# Patient Record
Sex: Female | Born: 1940 | Race: White | Hispanic: No | Smoking: Former smoker
Health system: Southern US, Community
[De-identification: ages and names within clinical notes are randomized; demographics above are authoritative.]

## PROBLEM LIST (undated history)

## (undated) DIAGNOSIS — R5382 Chronic fatigue, unspecified: Secondary | ICD-10-CM

## (undated) DIAGNOSIS — E669 Obesity, unspecified: Secondary | ICD-10-CM

## (undated) DIAGNOSIS — E039 Hypothyroidism, unspecified: Secondary | ICD-10-CM

## (undated) DIAGNOSIS — G459 Transient cerebral ischemic attack, unspecified: Secondary | ICD-10-CM

## (undated) DIAGNOSIS — R0609 Other forms of dyspnea: Secondary | ICD-10-CM

## (undated) DIAGNOSIS — R0600 Dyspnea, unspecified: Secondary | ICD-10-CM

## (undated) DIAGNOSIS — J302 Other seasonal allergic rhinitis: Secondary | ICD-10-CM

## (undated) DIAGNOSIS — I482 Chronic atrial fibrillation, unspecified: Principal | ICD-10-CM

## (undated) DIAGNOSIS — G8929 Other chronic pain: Secondary | ICD-10-CM

## (undated) DIAGNOSIS — J449 Chronic obstructive pulmonary disease, unspecified: Secondary | ICD-10-CM

## (undated) DIAGNOSIS — E079 Disorder of thyroid, unspecified: Secondary | ICD-10-CM

## (undated) DIAGNOSIS — I639 Cerebral infarction, unspecified: Secondary | ICD-10-CM

## (undated) HISTORY — DX: Transient cerebral ischemic attack, unspecified: G45.9

## (undated) HISTORY — PX: TOTAL HIP ARTHROPLASTY: SHX124

## (undated) HISTORY — DX: Chronic fatigue, unspecified: R53.82

## (undated) HISTORY — DX: Chronic obstructive pulmonary disease, unspecified: J44.9

## (undated) HISTORY — DX: Cerebral infarction, unspecified: I63.9

## (undated) HISTORY — DX: Hypothyroidism, unspecified: E03.9

## (undated) HISTORY — DX: Other chronic pain: G89.29

## (undated) HISTORY — DX: Other forms of dyspnea: R06.09

## (undated) HISTORY — PX: TONSILLECTOMY: SUR1361

## (undated) HISTORY — DX: Other seasonal allergic rhinitis: J30.2

## (undated) HISTORY — DX: Chronic atrial fibrillation, unspecified: I48.20

## (undated) HISTORY — DX: Obesity, unspecified: E66.9

## (undated) HISTORY — DX: Dyspnea, unspecified: R06.00

## (undated) HISTORY — DX: Disorder of thyroid, unspecified: E07.9

---

## 2003-05-01 ENCOUNTER — Ambulatory Visit (HOSPITAL_COMMUNITY): Admission: RE | Admit: 2003-05-01 | Discharge: 2003-05-01 | Payer: Self-pay | Admitting: Cardiology

## 2004-03-13 ENCOUNTER — Ambulatory Visit (HOSPITAL_COMMUNITY): Admission: RE | Admit: 2004-03-13 | Discharge: 2004-03-13 | Payer: Self-pay | Admitting: Internal Medicine

## 2004-07-10 ENCOUNTER — Ambulatory Visit: Payer: Self-pay | Admitting: Internal Medicine

## 2004-09-08 ENCOUNTER — Ambulatory Visit: Payer: Self-pay | Admitting: Internal Medicine

## 2004-09-29 ENCOUNTER — Encounter (HOSPITAL_COMMUNITY): Admission: RE | Admit: 2004-09-29 | Discharge: 2004-12-28 | Payer: Self-pay | Admitting: Endocrinology

## 2005-06-17 ENCOUNTER — Encounter (HOSPITAL_COMMUNITY): Admission: RE | Admit: 2005-06-17 | Discharge: 2005-09-15 | Payer: Self-pay | Admitting: Endocrinology

## 2005-12-16 ENCOUNTER — Encounter: Admission: RE | Admit: 2005-12-16 | Discharge: 2005-12-16 | Payer: Self-pay | Admitting: Orthopaedic Surgery

## 2006-01-07 ENCOUNTER — Encounter: Admission: RE | Admit: 2006-01-07 | Discharge: 2006-01-07 | Payer: Self-pay | Admitting: Orthopaedic Surgery

## 2006-02-22 ENCOUNTER — Encounter: Payer: Self-pay | Admitting: Internal Medicine

## 2006-03-23 ENCOUNTER — Encounter: Admission: RE | Admit: 2006-03-23 | Discharge: 2006-03-23 | Payer: Self-pay | Admitting: Orthopaedic Surgery

## 2006-05-09 ENCOUNTER — Encounter: Admission: RE | Admit: 2006-05-09 | Discharge: 2006-05-09 | Payer: Self-pay | Admitting: Cardiology

## 2006-06-03 ENCOUNTER — Ambulatory Visit (HOSPITAL_COMMUNITY): Admission: RE | Admit: 2006-06-03 | Discharge: 2006-06-03 | Payer: Self-pay | Admitting: Cardiology

## 2006-06-15 ENCOUNTER — Encounter: Admission: RE | Admit: 2006-06-15 | Discharge: 2006-06-15 | Payer: Self-pay | Admitting: Orthopaedic Surgery

## 2006-08-24 ENCOUNTER — Emergency Department (HOSPITAL_COMMUNITY): Admission: EM | Admit: 2006-08-24 | Discharge: 2006-08-24 | Payer: Self-pay | Admitting: Emergency Medicine

## 2006-08-31 ENCOUNTER — Ambulatory Visit (HOSPITAL_COMMUNITY): Admission: RE | Admit: 2006-08-31 | Discharge: 2006-08-31 | Payer: Self-pay | Admitting: Cardiology

## 2006-12-07 ENCOUNTER — Inpatient Hospital Stay (HOSPITAL_COMMUNITY): Admission: RE | Admit: 2006-12-07 | Discharge: 2006-12-14 | Payer: Self-pay | Admitting: Orthopedic Surgery

## 2006-12-08 HISTORY — PX: JOINT REPLACEMENT: SHX530

## 2007-08-04 ENCOUNTER — Encounter: Payer: Self-pay | Admitting: Internal Medicine

## 2007-10-17 ENCOUNTER — Emergency Department (HOSPITAL_COMMUNITY): Admission: EM | Admit: 2007-10-17 | Discharge: 2007-10-17 | Payer: Self-pay | Admitting: Family Medicine

## 2007-10-31 ENCOUNTER — Encounter: Payer: Self-pay | Admitting: Internal Medicine

## 2008-06-07 DIAGNOSIS — G459 Transient cerebral ischemic attack, unspecified: Secondary | ICD-10-CM

## 2008-06-07 HISTORY — DX: Transient cerebral ischemic attack, unspecified: G45.9

## 2008-08-19 IMAGING — CR DG HIP 1V PORT*L*
1 series · 1 of 1 positions shown · non-contrast
Comparison: None.

CLINICAL DATA: Post left hip replacement.  
 PORTABLE LEFT HIP ? 1 VIEW:

[view not recorded]
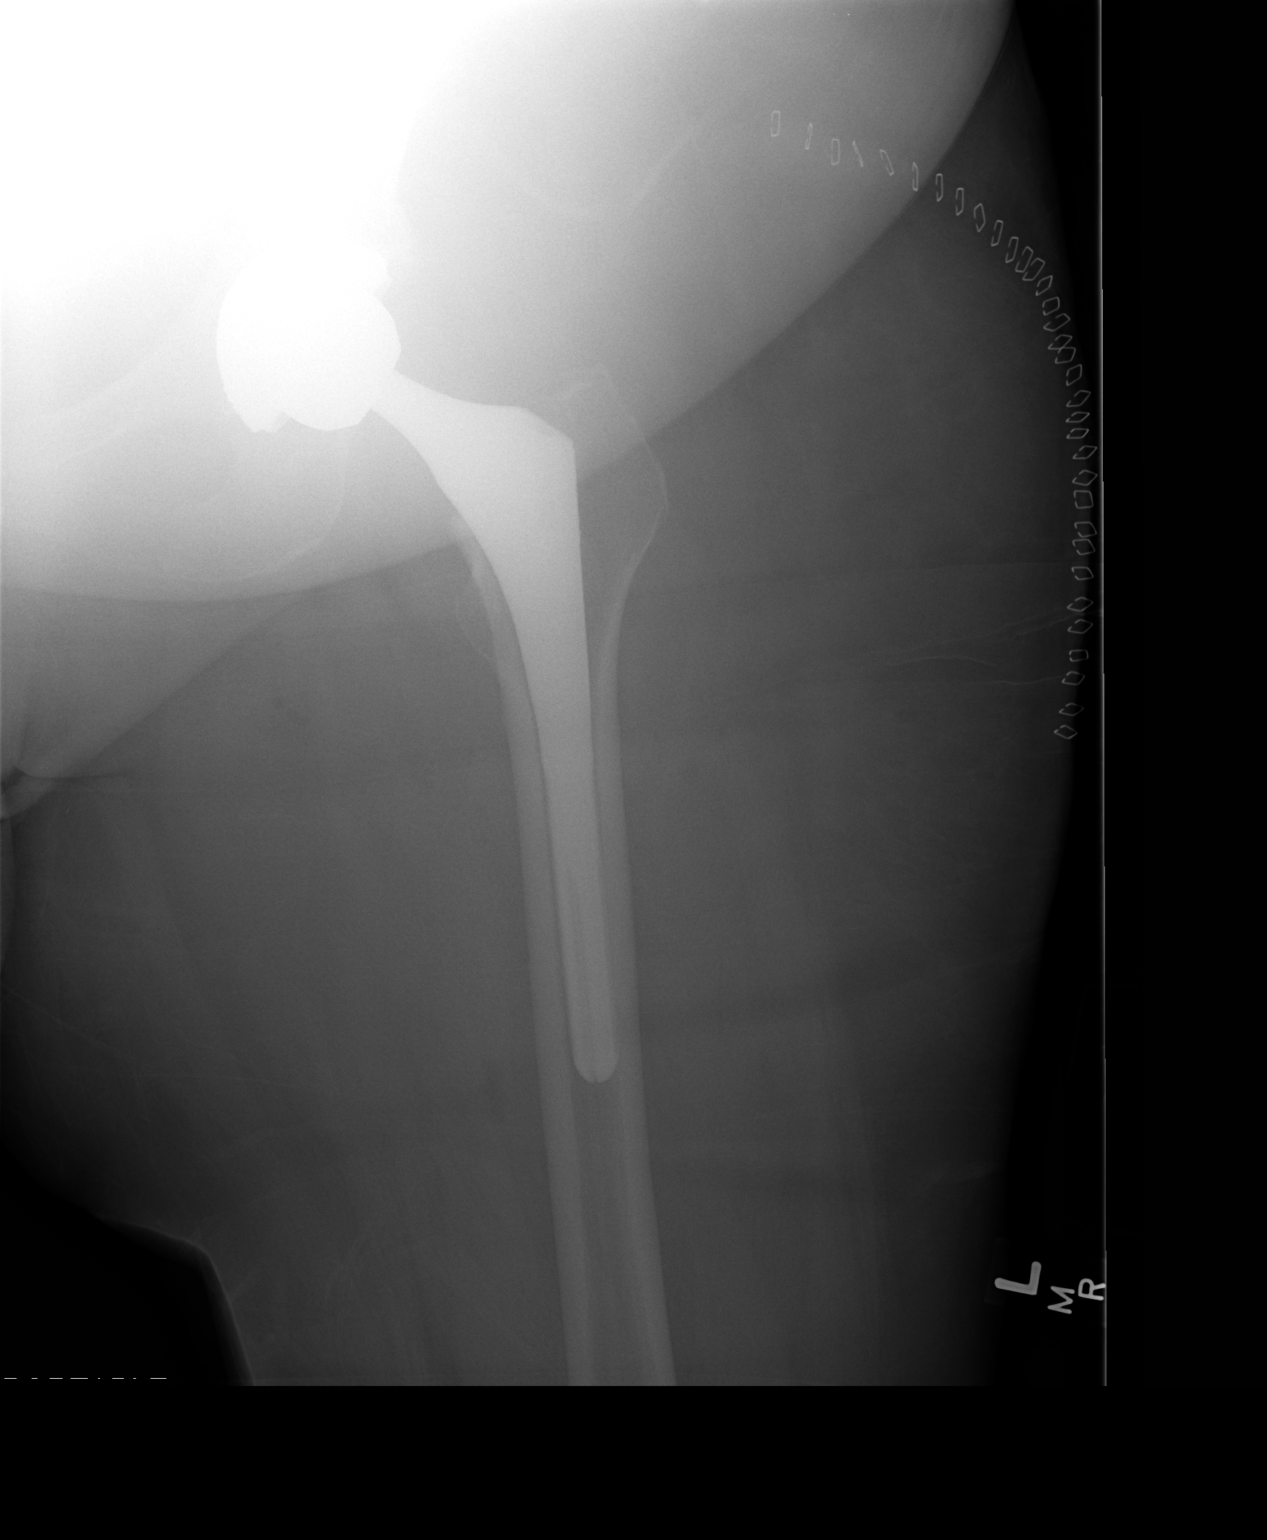

[1 of 1 positions shown; findings below may reference images not displayed]

FINDINGS: Status post placement of a left total hip prosthesis.  The acetabular component appears well positioned, anchored by 2 ischial screws.  Intramedullary component is unremarkable.
IMPRESSION: Status post left total hip replacement ? good position and alignment in 1 view.

## 2008-10-07 ENCOUNTER — Encounter: Payer: Self-pay | Admitting: Internal Medicine

## 2008-10-07 ENCOUNTER — Ambulatory Visit: Payer: Self-pay | Admitting: Family Medicine

## 2008-10-07 ENCOUNTER — Inpatient Hospital Stay (HOSPITAL_COMMUNITY): Admission: EM | Admit: 2008-10-07 | Discharge: 2008-10-09 | Payer: Self-pay | Admitting: Emergency Medicine

## 2008-10-08 ENCOUNTER — Encounter: Payer: Self-pay | Admitting: Internal Medicine

## 2008-10-08 ENCOUNTER — Encounter: Payer: Self-pay | Admitting: Family Medicine

## 2008-10-08 ENCOUNTER — Ambulatory Visit: Payer: Self-pay | Admitting: Vascular Surgery

## 2008-10-16 ENCOUNTER — Encounter: Payer: Self-pay | Admitting: Internal Medicine

## 2008-10-31 ENCOUNTER — Ambulatory Visit: Payer: Self-pay | Admitting: Family Medicine

## 2008-11-05 ENCOUNTER — Ambulatory Visit: Payer: Self-pay | Admitting: Family Medicine

## 2008-12-16 ENCOUNTER — Encounter: Payer: Self-pay | Admitting: Internal Medicine

## 2009-07-14 ENCOUNTER — Encounter: Payer: Self-pay | Admitting: Internal Medicine

## 2009-09-17 ENCOUNTER — Encounter: Payer: Self-pay | Admitting: Internal Medicine

## 2009-09-29 ENCOUNTER — Encounter: Payer: Self-pay | Admitting: Internal Medicine

## 2009-10-09 ENCOUNTER — Encounter: Payer: Self-pay | Admitting: Internal Medicine

## 2009-11-20 ENCOUNTER — Encounter: Payer: Self-pay | Admitting: Internal Medicine

## 2010-01-15 ENCOUNTER — Encounter: Payer: Self-pay | Admitting: Internal Medicine

## 2010-01-16 ENCOUNTER — Encounter: Payer: Self-pay | Admitting: Internal Medicine

## 2010-01-16 ENCOUNTER — Ambulatory Visit (HOSPITAL_COMMUNITY): Admission: RE | Admit: 2010-01-16 | Discharge: 2010-01-16 | Payer: Self-pay | Admitting: Cardiology

## 2010-01-26 ENCOUNTER — Encounter: Payer: Self-pay | Admitting: Internal Medicine

## 2010-02-05 DIAGNOSIS — I10 Essential (primary) hypertension: Secondary | ICD-10-CM

## 2010-02-05 DIAGNOSIS — E039 Hypothyroidism, unspecified: Secondary | ICD-10-CM

## 2010-02-05 DIAGNOSIS — I480 Paroxysmal atrial fibrillation: Secondary | ICD-10-CM

## 2010-02-06 ENCOUNTER — Ambulatory Visit: Payer: Self-pay | Admitting: Internal Medicine

## 2010-04-16 ENCOUNTER — Encounter: Payer: Self-pay | Admitting: Internal Medicine

## 2010-05-09 ENCOUNTER — Inpatient Hospital Stay (HOSPITAL_COMMUNITY)
Admission: EM | Admit: 2010-05-09 | Discharge: 2010-05-11 | Payer: Self-pay | Source: Home / Self Care | Admitting: Emergency Medicine

## 2010-06-01 ENCOUNTER — Emergency Department (HOSPITAL_COMMUNITY)
Admission: EM | Admit: 2010-06-01 | Discharge: 2010-06-01 | Payer: Self-pay | Source: Home / Self Care | Admitting: Emergency Medicine

## 2010-06-19 IMAGING — CT CT HEAD W/O CM
1 series · 16 of 30 positions shown, 20 images · non-contrast
Comparison: None.

CLINICAL DATA: Episodic right parasthesias, facial droop, slurred
speech, history of migraines.

CT HEAD WITHOUT CONTRAST
TECHNIQUE: Contiguous axial images were obtained from the base of
the skull through the vertex without contrast.

[Series 2: brain · axial · 0.47mm/px · z∈[+130,+270]mm · 16 of 30 slices shown, 20 images]
[im 2/30  brain]
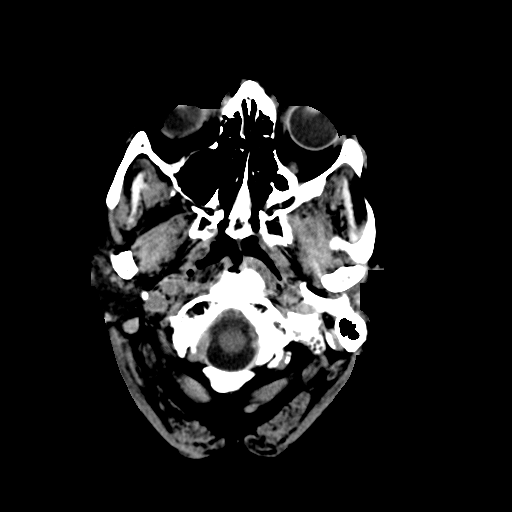
[im 2/30  bone]
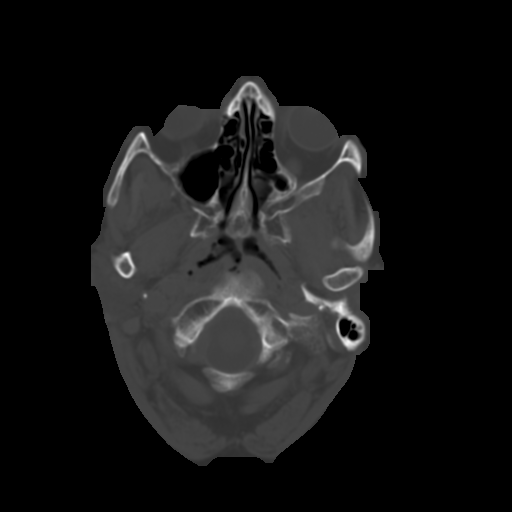
[im 4/30  brain]
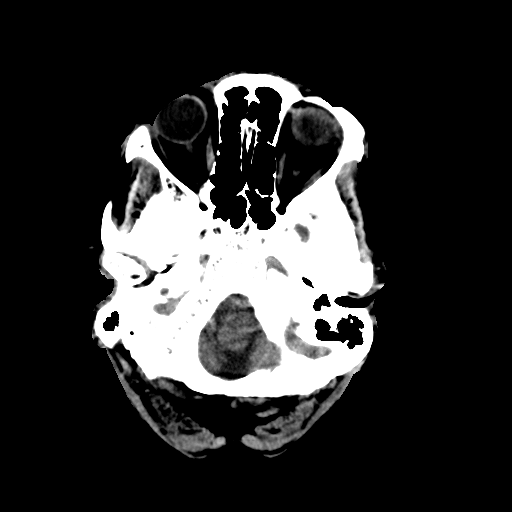
[im 6/30  brain]
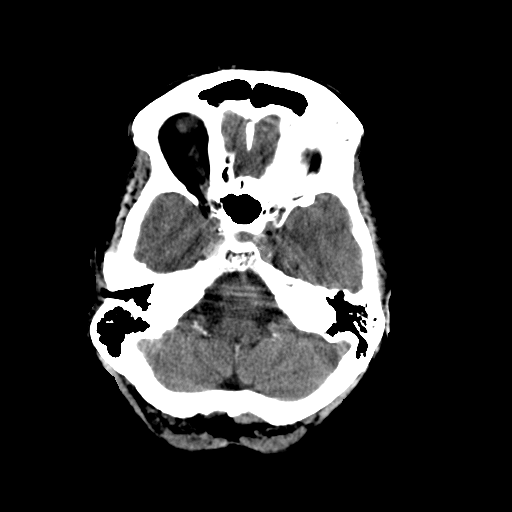
[im 8/30  brain]
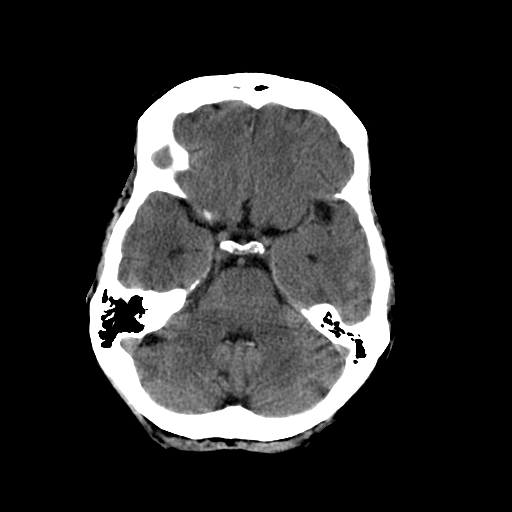
[im 9/30  brain]
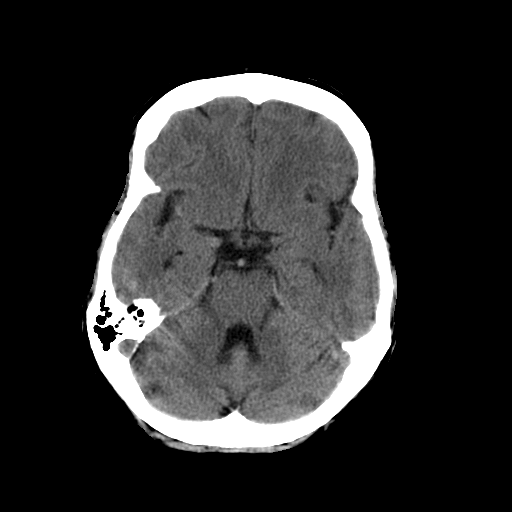
[im 9/30  bone]
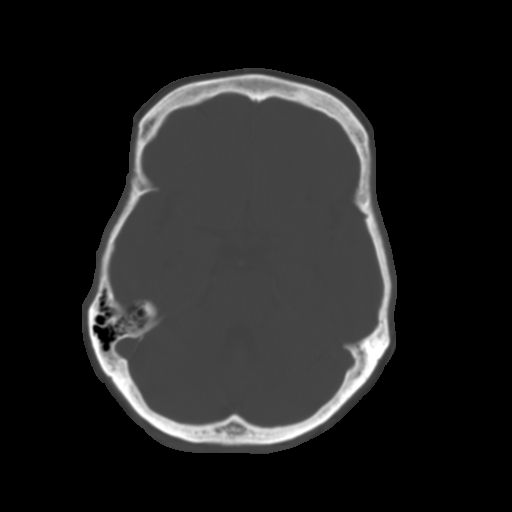
[im 11/30  brain]
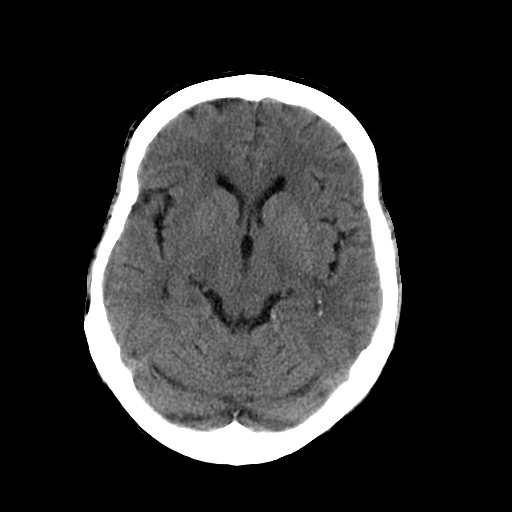
[im 13/30  brain]
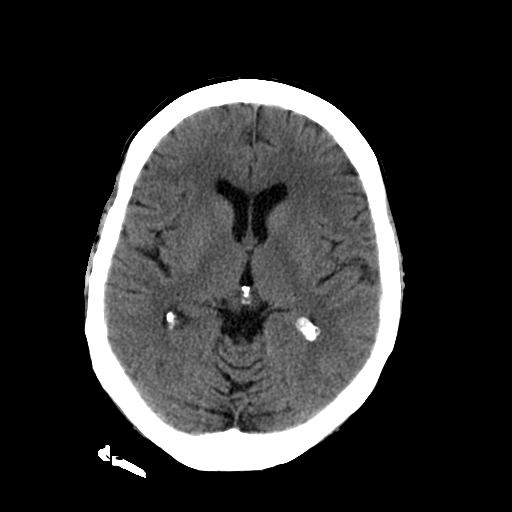
[im 15/30  brain]
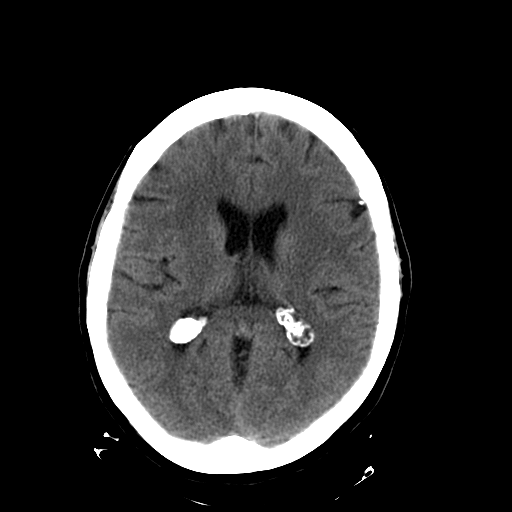
[im 16/30  brain]
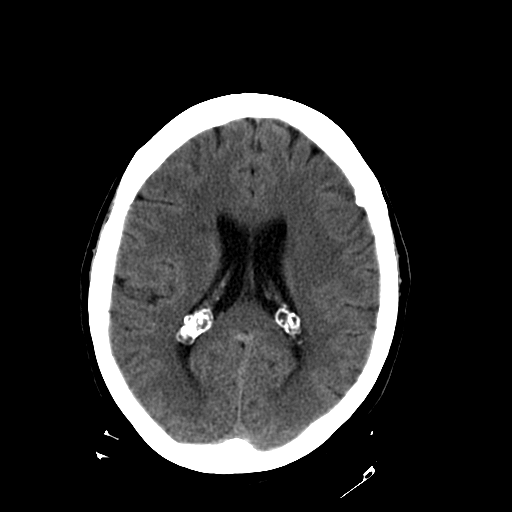
[im 16/30  bone]
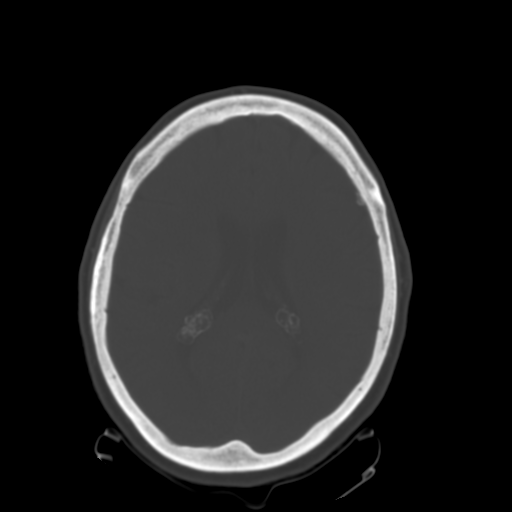
[im 18/30  brain]
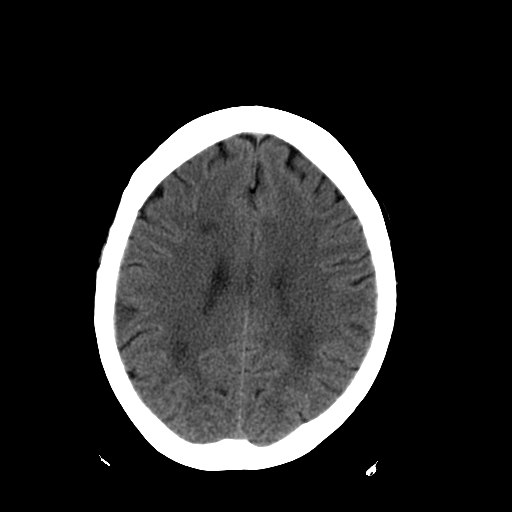
[im 20/30  brain]
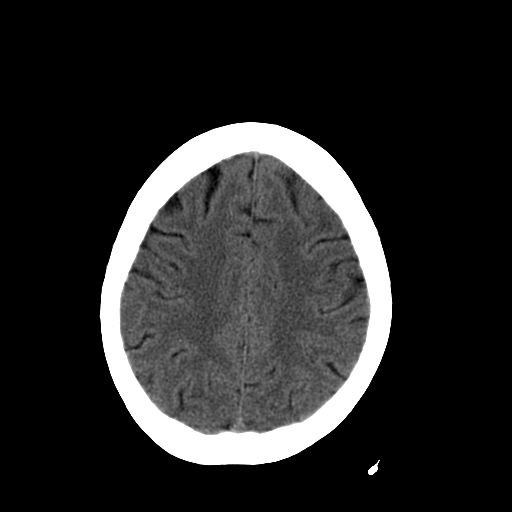
[im 22/30  brain]
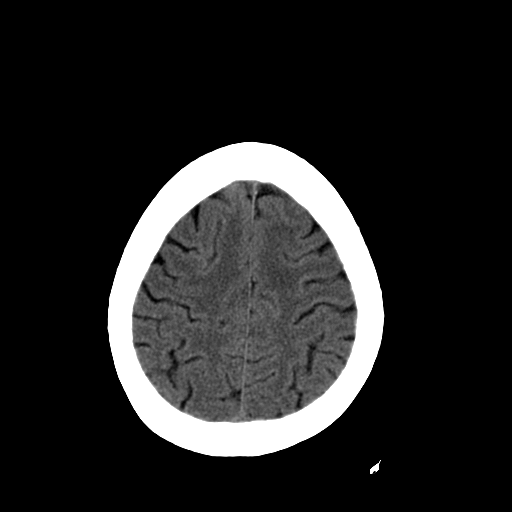
[im 23/30  brain]
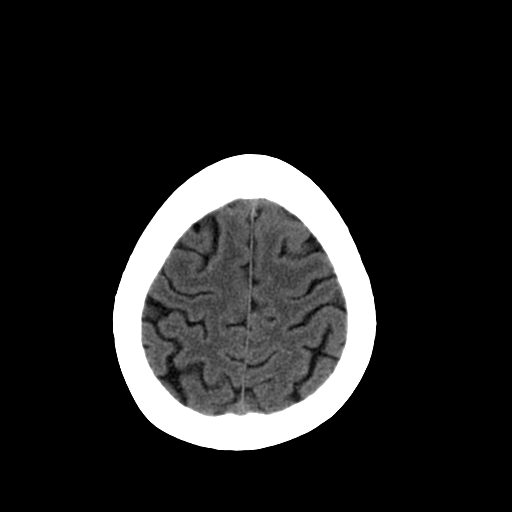
[im 23/30  bone]
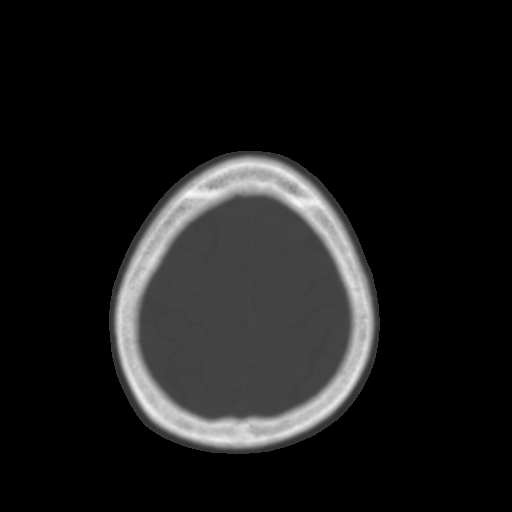
[im 25/30  brain]
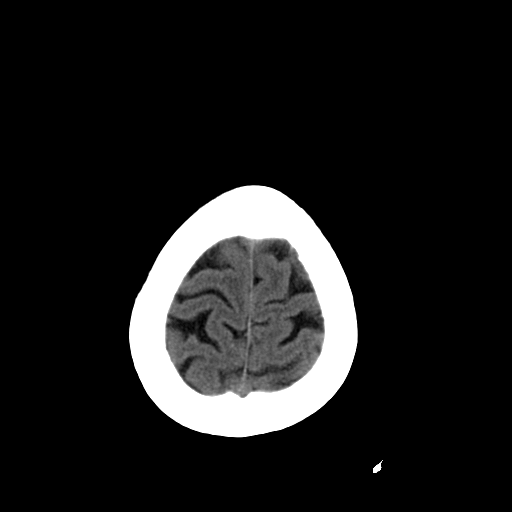
[im 27/30  brain]
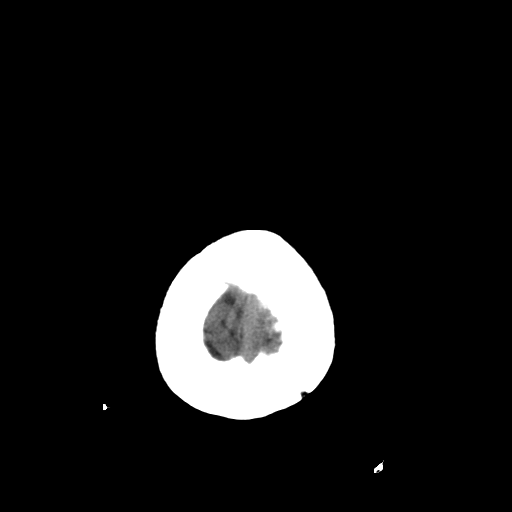
[im 29/30  brain]
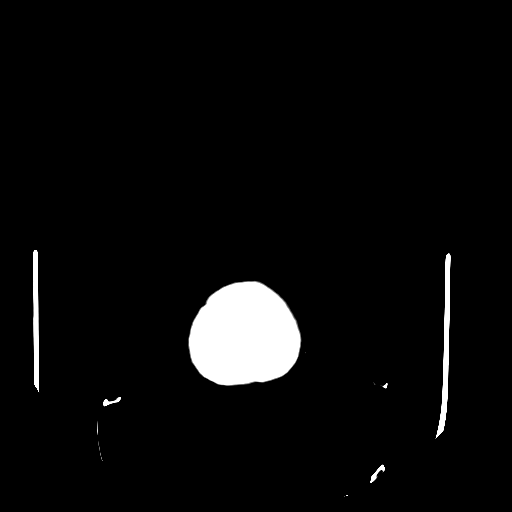

[16 of 30 positions shown; findings below may reference images not displayed]

FINDINGS: Minimal patchy hypoattenuation in the subcortical and
periventricular white matter consistent with microvascular ischemic
changes.  Preserved gray and white matter differentiation.  No
acute intracranial hemorrhage, definite infarction, mass lesion,
hydrocephalus, midline shift, herniation, or extra-axial fluid
collection.  Symmetric prominent choroid plexus calcifications.
Cisterns are patent.  Mastoids and sinuses visualized are clear.
Minimal intracranial atherosclerosis.
IMPRESSION: No acute intracranial finding.
Microvascular ischemic changes

## 2010-06-27 ENCOUNTER — Encounter: Payer: Self-pay | Admitting: Internal Medicine

## 2010-06-28 ENCOUNTER — Encounter: Payer: Self-pay | Admitting: Endocrinology

## 2010-06-28 ENCOUNTER — Encounter: Payer: Self-pay | Admitting: Orthopaedic Surgery

## 2010-07-07 NOTE — Letter (Signed)
Summary: [facility]  [facility]   Imported By: Sherian Rein 12/02/2009 11:23:43  _____________________________________________________________________  External Attachment:    Type:   Image     Comment:   External Document

## 2010-07-07 NOTE — Letter (Signed)
Summary: [facility] Office Note   [facility] Office Note   Imported By: Roderic Ovens 02/26/2010 16:16:43  _____________________________________________________________________  External Attachment:    Type:   Image     Comment:   External Document

## 2010-07-07 NOTE — Assessment & Plan Note (Signed)
Summary: nep/eval for afib ablatio   Visit Type:  Initial Consult Referring Provider:  Caprice Kluver, MD  CC:  "my heart is in afib.".  History of Present Illness: [name] is a pleasant [phone] WF with a h/o obesity and persistent atrial fibrillation who presents today for EP consultation.  She reports initially being diagnosed with atrial fibrillation 9/04 after presenting to urgent care with dypsnea and being found to have afib with RVR.  She was initiated on coumadin and cardioverted.  She was treated with cardizem thereafter.  She maintained sinus rhythm for 3 years.  She returned to afib in 2007 and underwent repeat cardioversion 12/07.  She maintained sinus rhythm for a year and then returned to afib.  She was placed on amiodarone.  She maintained sinus rhythm initially but eventually could not tolerate amiodarone.  She therefore stopped amiodarone.  In addition, she stopped coumadin and had a TIA 5/10.  She was most recently cardioverted 8/11, but returned to afib within several days. She was placed on bystolic but did not tolerate this medicine due to diarrhea.  During afib, she reports symptoms of significantly reduced exercise tolerance as well as fatigue.    She now presents for EP consultation regarding her afib.  Current Medications (verified): 1)  Synthroid 137 Mcg Tabs (Levothyroxine Sodium) .... Take One Daily 2)  Diltiazem Hcl Coated Beads 240 Mg Xr24h-Cap (Diltiazem Hcl Coated Beads) .... Take One Daily 3)  Warfarin Sodium 2.5 Mg Tabs (Warfarin Sodium) .... Take As Directed 4)  Aspir-Low 81 Mg Tbec (Aspirin) .... Take One Daily 5)  Vitamin D3 1000 Unit Tabs (Cholecalciferol) .... Take One Daily 6)  Fish Oil 1000 Mg Caps (Omega-3 Fatty Acids) .... Take One Daily 7)  Furosemide 40 Mg Tabs (Furosemide) .... Take One As Needed 8)  Cyclobenzaprine Hcl 10 Mg Tabs (Cyclobenzaprine Hcl) .... Take As Needed  Past History:  Past Medical History: Persistent atrial fibrillaiton s/p DCC x  3 Hypothyroidism s/p RAI for hyperthyroidism GERD COPD TIA 5/10 DJD/DDD  Past Surgical History: L hip replacement  7/08  Family History:  cancer   pt never knew her father and is unaware of his PMH  Social History: The patient is divorced, does not work.  She lives in [place].  Smoked up until 2006.  No intake of alcohol products. Lives with her friend Ilda Mori.     Review of Systems       All systems are reviewed and negative except as listed in the HPI.  + snores,  + daytime somnolence  Vital Signs:  Patient profile:   70 year old female Height:      63 inches Weight:      227 pounds BMI:     40.36 Pulse rate:   84 / minute Pulse rhythm:   irregular BP sitting:   132 / 98  (left arm)  Vitals Entered By: Jacquelin Hawking, CMA (February 06, 2010 10:36 AM)  Physical Exam  General:  morbidly obese, walks slowly with a cane Head:  normocephalic and atraumatic Eyes:  PERRLA/EOM intact; conjunctiva and lids normal. Mouth:  Teeth, gums and palate normal. Oral mucosa normal. Neck:  Neck supple, no JVD. No masses, thyromegaly or abnormal cervical nodes. Lungs:  Clear bilaterally to auscultation and percussion. Heart:  iRRR, no m/r/g Abdomen:  Bowel sounds positive; abdomen soft and non-tender without masses, organomegaly, or hernias noted. No hepatosplenomegaly. Msk:  walksk slowly with a cane, complains of severe R groin and back  pain Pulses:  pulses normal in all 4 extremities Extremities:  No clubbing or cyanosis.  trace edema Neurologic:  Alert and oriented x 3. Skin:  Intact without lesions or rashes. Cervical Nodes:  no significant adenopathy Psych:  Normal affect.    Nuclear Study  Procedure date:  02/22/2006  Findings:      The post stress myocardial perfusion images show a normal pattern of perfusion in all regions. The post stress left ventricle is normal in size. This was essentially a low risk scan. This is essentially a normal perfusion mocardial  scan. No significant ischemia demonstrated. No prior study available for comparison.   Echocardiogram  Procedure date:  10/08/2008  Findings:      1. Left ventricle: The cavity size was normal. Systolic function was      normal. The estimated ejection fraction was in the range of 50%      to 55%. Doppler parameters are consistent with abnormal left      ventricular relaxation (grade 1 diastolic dysfunction).   2. Aortic valve: Trivial regurgitation.   3. Mitral valve: Calcified annulus.   4. Right atrium: The atrium was mildly dilated.   Impressions:    - Left ventricular systolic function at the lower range of normal,     grade 1 pattern of diastolic function and no hemodynamic     significant valve abnormalities.    EKG  Procedure date:  01/16/2010  Findings:      afib,  V rate 79, qtc 435  Impression & Recommendations:  Problem # 1:  ATRIAL FIBRILLATION (ICD-427.31) The patient has symptomatic persistent atrial fibrillation.  She previously had good control of her afib with amiodarone but did not tolerate this medicine.  Her CHADS2 score is 3 (HTN and prior TIA).  She is appropriately anticoagulated with coumadin.  Therapeutic strategies for afib including medicine and ablation were discussed in detail with the patient today. Risk, benefits, and alternatives to EP study and radiofrequency ablation for afib were also discussed in detail today. These risks include but are not limited to stroke, bleeding, vascular damage, tamponade, perforation, damage to the esophagus, lungs, and other structures, pulmonary vein stenosis, worsening renal function, and death. The patient understands these risk and also understands that her anticipated success rate with ablation is 50-60%, likely requiring more than one procedure.  She is very hesitant and does not wish to proceed with ablation at this time. I would therefore recommend further AAD therapy.  As she snores and has obese body habitus,  I have recommended (strongly) that she have a sleep study obtained through Dr Fredirick Maudlin office.  If she has sleep apnea, treatment for this will be essential in order to maintain sinus rhythm longterm. In addition, I would recommend that she have a stress test obtained through Dr Fredirick Maudlin office.  If this is normal, then I would recommend flecainide 100mg  two times a day for treatment of afib.  If she has coronary artery disease, then I would recommend tikosyn initiated during a 3 day hospitalization. Given the patient's persisent afib, I would not recommend multaq as I think that this medicine is unlikely to sufficiently control her afib.   She will discuss and initate the above with Dr Clarene Duke. I will see her again in follow-up in 3 months.  Problem # 2:  HYPERTENSION (ICD-401.9) stable Her updated medication list for this problem includes:    Diltiazem Hcl Coated Beads 240 Mg Xr24h-cap (Diltiazem hcl coated beads) .Marland Kitchen... Take one daily  Aspir-low 81 Mg Tbec (Aspirin) .Marland Kitchen... Take one daily    Furosemide 40 Mg Tabs (Furosemide) .Marland Kitchen... Take one as needed  Other Orders: EKG w/ Interpretation (93000)  Patient Instructions: 1)  Your physician recommends that you schedule a follow-up appointment in:   3 months

## 2010-07-07 NOTE — Letter (Signed)
Summary: [facility] Echo, Myocardial Perfusion 2007  [facility] Echo, Myocardial Perfusion 2007   Imported By: Roderic Ovens 02/26/2010 16:16:22  _____________________________________________________________________  External Attachment:    Type:   Image     Comment:   External Document

## 2010-07-07 NOTE — Letter (Signed)
Summary: [facility] Office Note   [facility] Office Note   Imported By: Roderic Ovens 02/26/2010 16:17:35  _____________________________________________________________________  External Attachment:    Type:   Image     Comment:   External Document

## 2010-07-07 NOTE — Letter (Signed)
Summary: [facility] Office Note   [facility] Office Note   Imported By: Roderic Ovens 02/26/2010 16:17:23  _____________________________________________________________________  External Attachment:    Type:   Image     Comment:   External Document

## 2010-07-07 NOTE — Letter (Signed)
Summary: [facility] Office Note   [facility] Office Note   Imported By: Roderic Ovens 02/26/2010 16:17:06  _____________________________________________________________________  External Attachment:    Type:   Image     Comment:   External Document

## 2010-07-07 NOTE — Letter (Signed)
Summary: [facility]  [facility]   Imported By: Sherian Rein 04/29/2010 08:55:12  _____________________________________________________________________  External Attachment:    Type:   Image     Comment:   External Document

## 2010-07-07 NOTE — Letter (Signed)
Summary: [facility] INR Dosing History   [facility] INR Dosing History   Imported By: Roderic Ovens 03/31/2010 09:26:26  _____________________________________________________________________  External Attachment:    Type:   Image     Comment:   External Document

## 2010-07-07 NOTE — Letter (Signed)
Summary: [facility]  [facility]   Imported By: Sherian Rein 10/23/2009 14:12:36  _____________________________________________________________________  External Attachment:    Type:   Image     Comment:   External Document

## 2010-07-07 NOTE — Letter (Signed)
Summary: [facility]  [facility]   Imported By: Lennie Odor 02/04/2010 13:53:38  _____________________________________________________________________  External Attachment:    Type:   Image     Comment:   External Document

## 2010-07-07 NOTE — Letter (Signed)
Summary: [facility] Office Note   [facility] Office Note   Imported By: Roderic Ovens 02/26/2010 16:15:05  _____________________________________________________________________  External Attachment:    Type:   Image     Comment:   External Document

## 2010-07-24 ENCOUNTER — Ambulatory Visit (INDEPENDENT_AMBULATORY_CARE_PROVIDER_SITE_OTHER): Payer: BC Managed Care – PPO | Admitting: Family Medicine

## 2010-07-24 DIAGNOSIS — L600 Ingrowing nail: Secondary | ICD-10-CM

## 2010-07-30 ENCOUNTER — Ambulatory Visit (HOSPITAL_COMMUNITY)
Admission: RE | Admit: 2010-07-30 | Discharge: 2010-07-30 | Disposition: A | Discharge status: Home / Self Care | Payer: Medicare Other | Source: Ambulatory Visit | Attending: Cardiology | Admitting: Cardiology

## 2010-07-30 DIAGNOSIS — Z8673 Personal history of transient ischemic attack (TIA), and cerebral infarction without residual deficits: Secondary | ICD-10-CM | POA: Insufficient documentation

## 2010-07-30 DIAGNOSIS — E669 Obesity, unspecified: Secondary | ICD-10-CM | POA: Insufficient documentation

## 2010-07-30 DIAGNOSIS — J449 Chronic obstructive pulmonary disease, unspecified: Secondary | ICD-10-CM | POA: Insufficient documentation

## 2010-07-30 DIAGNOSIS — I4891 Unspecified atrial fibrillation: Secondary | ICD-10-CM | POA: Insufficient documentation

## 2010-07-30 DIAGNOSIS — J4489 Other specified chronic obstructive pulmonary disease: Secondary | ICD-10-CM | POA: Insufficient documentation

## 2010-07-30 DIAGNOSIS — I10 Essential (primary) hypertension: Secondary | ICD-10-CM | POA: Insufficient documentation

## 2010-07-30 LAB — BASIC METABOLIC PANEL
BUN: 14 mg/dL (ref 6–23)
Calcium: 9.3 mg/dL (ref 8.4–10.5)
GFR calc non Af Amer: 54 mL/min — ABNORMAL LOW (ref >60)
Glucose, Bld: 97 mg/dL (ref 70–99)

## 2010-07-30 LAB — CBC
MCHC: 33.7 g/dL (ref 30.0–36.0)
RDW: 13.9 % (ref 11.5–15.5)

## 2010-07-30 LAB — PROTIME-INR
INR: 2.03 — ABNORMAL HIGH (ref 0.00–1.49)
Prothrombin Time: 23.1 seconds — ABNORMAL HIGH (ref 11.6–15.2)

## 2010-08-06 NOTE — Procedures (Signed)
  NAME:  [name], [name]                  ACCOUNT NO.:  [id number]  MEDICAL RECORD NO.:  [id number]           PATIENT TYPE:  O  LOCATION:  [facility]                         FACILITY:  [facility]  PHYSICIAN:  Thereasa Solo. Little, M.D. DATE OF BIRTH:  Apr 21, 1941  DATE OF PROCEDURE:  07/30/2010 DATE OF DISCHARGE:  07/30/2010                           CARDIAC CATHETERIZATION   This 71 year old female has paroxysmal atrial fibrillation.  She had been cardioverted remotely in the past when she was on Multaq, but would not maintain a sinus rhythm.  She had been on Tikosyn since November 2011 and she comes back in for outpatient elective cardioversion.  After obtaining informed consent, the patient was given 90 mg of IV Diprivan by Anesthesia.  After an appropriate level of anesthesia was obtained, the patient underwent elective cardioversion.  Using anterior and posterior pads, a 120-watt seconds resulted in the patient remaining in atrial fib.  A 150-watt seconds was then used and the patient converted into sinus rhythm with a rate of 66.  CONCLUSION:  Successful cardioversion.  She will be discharged to home today and follow up in my office on Friday.          ______________________________ Thereasa Solo. Little, M.D.     ABL/MEDQ  D:  07/30/2010  T:  07/31/2010  Job:  [id number]  cc:   Leonie Man, M.D.  Electronically Signed by Julieanne Manson M.D. on 08/06/2010 07:58:48 AM

## 2010-08-17 LAB — URINALYSIS, ROUTINE W REFLEX MICROSCOPIC
Bilirubin Urine: NEGATIVE
Bilirubin Urine: NEGATIVE
Glucose, UA: NEGATIVE mg/dL
Hgb urine dipstick: NEGATIVE
Hgb urine dipstick: NEGATIVE
Ketones, ur: NEGATIVE mg/dL
Protein, ur: 30 mg/dL — AB
Protein, ur: 100 mg/dL — AB
Urobilinogen, UA: 0.2 mg/dL (ref 0.0–1.0)

## 2010-08-17 LAB — DIFFERENTIAL
Basophils Absolute: 0 10*3/uL (ref 0.0–0.1)
Basophils Relative: 0 % (ref 0–1)
Eosinophils Absolute: 0 10*3/uL (ref 0.0–0.7)
Eosinophils Absolute: 0.1 10*3/uL (ref 0.0–0.7)
Eosinophils Relative: 1 % (ref 0–5)
Lymphocytes Relative: 19 % (ref 12–46)
Lymphs Abs: 1 10*3/uL (ref 0.7–4.0)
Lymphs Abs: 1.3 10*3/uL (ref 0.7–4.0)
Monocytes Relative: 7 % (ref 3–12)
Neutrophils Relative %: 80 % — ABNORMAL HIGH (ref 43–77)

## 2010-08-17 LAB — PROTIME-INR
INR: 2.33 — ABNORMAL HIGH (ref 0.00–1.49)
INR: 2.61 — ABNORMAL HIGH (ref 0.00–1.49)
INR: 2.66 — ABNORMAL HIGH (ref 0.00–1.49)
Prothrombin Time: 28 seconds — ABNORMAL HIGH (ref 11.6–15.2)

## 2010-08-17 LAB — BASIC METABOLIC PANEL
CO2: 26 mEq/L (ref 19–32)
Chloride: 103 mEq/L (ref 96–112)
GFR calc non Af Amer: >60 mL/min (ref >60)
Glucose, Bld: 93 mg/dL (ref 70–99)
Potassium: 3.8 mEq/L (ref 3.5–5.1)
Sodium: 136 mEq/L (ref 135–145)

## 2010-08-17 LAB — CBC
HCT: 40.5 % (ref 36.0–46.0)
Hemoglobin: 13.1 g/dL (ref 12.0–15.0)
Hemoglobin: 13 g/dL (ref 12.0–15.0)
MCH: 29.1 pg (ref 26.0–34.0)
MCHC: 32.3 g/dL (ref 30.0–36.0)
MCV: 89.9 fL (ref 78.0–100.0)
RBC: 4.47 MIL/uL (ref 3.87–5.11)
WBC: 6.8 10*3/uL (ref 4.0–10.5)

## 2010-08-17 LAB — POCT I-STAT, CHEM 8
BUN: 14 mg/dL (ref 6–23)
Calcium, Ion: 1.1 mmol/L — ABNORMAL LOW (ref 1.12–1.32)
Chloride: 105 mEq/L (ref 96–112)
Creatinine, Ser: 0.9 mg/dL (ref 0.4–1.2)
Glucose, Bld: 127 mg/dL — ABNORMAL HIGH (ref 70–99)
HCT: 44 % (ref 36.0–46.0)
Hemoglobin: 15 g/dL (ref 12.0–15.0)
Potassium: 3.9 mEq/L (ref 3.5–5.1)
Sodium: 138 mEq/L (ref 135–145)
TCO2: 25 mmol/L (ref 0–100)

## 2010-08-17 LAB — COMPREHENSIVE METABOLIC PANEL
ALT: 17 U/L (ref 0–35)
Alkaline Phosphatase: 86 U/L (ref 39–117)
CO2: 22 mEq/L (ref 19–32)
Calcium: 9 mg/dL (ref 8.4–10.5)
Chloride: 102 mEq/L (ref 96–112)
GFR calc non Af Amer: >60 mL/min (ref >60)
Glucose, Bld: 99 mg/dL (ref 70–99)
Sodium: 134 mEq/L — ABNORMAL LOW (ref 135–145)
Total Bilirubin: 0.5 mg/dL (ref 0.3–1.2)

## 2010-08-17 LAB — CARDIAC PANEL(CRET KIN+CKTOT+MB+TROPI)
CK, MB: 2.5 ng/mL (ref 0.3–4.0)
CK, MB: 2.8 ng/mL (ref 0.3–4.0)
Relative Index: INVALID (ref 0.0–2.5)
Total CK: 83 U/L (ref 7–177)
Troponin I: 0.02 ng/mL (ref 0.00–0.06)

## 2010-08-17 LAB — CK TOTAL AND CKMB (NOT AT ARMC)
CK, MB: 8.2 ng/mL (ref 0.3–4.0)
CK, MB: 4 ng/mL (ref 0.3–4.0)
Total CK: 146 U/L (ref 7–177)

## 2010-08-17 LAB — POCT CARDIAC MARKERS
CKMB, poc: 2.7 ng/mL (ref 1.0–8.0)
Myoglobin, poc: 79.5 ng/mL (ref 12–200)
Troponin i, poc: <0.05 ng/mL (ref 0.00–0.09)

## 2010-08-17 LAB — URINE MICROSCOPIC-ADD ON

## 2010-08-17 LAB — LIPID PANEL: Cholesterol: 167 mg/dL (ref 0–200)

## 2010-08-21 LAB — BASIC METABOLIC PANEL
Chloride: 106 mEq/L (ref 96–112)
Creatinine, Ser: 0.33 mg/dL — ABNORMAL LOW (ref 0.4–1.2)
GFR calc Af Amer: >60 mL/min (ref >60)
Potassium: 3.9 mEq/L (ref 3.5–5.1)

## 2010-08-21 LAB — CBC
MCV: 89.7 fL (ref 78.0–100.0)
Platelets: 314 10*3/uL (ref 150–400)
RBC: 4.28 MIL/uL (ref 3.87–5.11)
WBC: 6.7 10*3/uL (ref 4.0–10.5)

## 2010-08-21 LAB — TSH: TSH: 0.38 u[IU]/mL (ref 0.350–4.500)

## 2010-08-21 LAB — APTT: aPTT: 41 seconds — ABNORMAL HIGH (ref 24–37)

## 2010-09-15 LAB — HEPARIN LEVEL (UNFRACTIONATED): Heparin Unfractionated: 0.36 IU/mL (ref 0.30–0.70)

## 2010-09-15 LAB — CBC
HCT: 33.3 % — ABNORMAL LOW (ref 36.0–46.0)
HCT: 34.4 % — ABNORMAL LOW (ref 36.0–46.0)
Hemoglobin: 11.5 g/dL — ABNORMAL LOW (ref 12.0–15.0)
MCHC: 34 g/dL (ref 30.0–36.0)
MCHC: 34.6 g/dL (ref 30.0–36.0)
MCV: 88.9 fL (ref 78.0–100.0)
MCV: 89.9 fL (ref 78.0–100.0)
Platelets: 257 10*3/uL (ref 150–400)
RBC: 3.71 MIL/uL — ABNORMAL LOW (ref 3.87–5.11)
RBC: 3.87 MIL/uL (ref 3.87–5.11)
RBC: 3.99 MIL/uL (ref 3.87–5.11)
RDW: 13.3 % (ref 11.5–15.5)
RDW: 13.4 % (ref 11.5–15.5)
WBC: 7.1 10*3/uL (ref 4.0–10.5)

## 2010-09-15 LAB — POCT CARDIAC MARKERS: Myoglobin, poc: 90.3 ng/mL (ref 12–200)

## 2010-09-15 LAB — PROTIME-INR
INR: 1 (ref 0.00–1.49)
INR: 1.2 (ref 0.00–1.49)
Prothrombin Time: 13.7 seconds (ref 11.6–15.2)
Prothrombin Time: 16.4 seconds — ABNORMAL HIGH (ref 11.6–15.2)

## 2010-09-15 LAB — DIFFERENTIAL
Basophils Absolute: 0 10*3/uL (ref 0.0–0.1)
Basophils Relative: 1 % (ref 0–1)
Neutro Abs: 3.7 10*3/uL (ref 1.7–7.7)
Neutrophils Relative %: 58 % (ref 43–77)

## 2010-09-15 LAB — URINALYSIS, ROUTINE W REFLEX MICROSCOPIC
Bilirubin Urine: NEGATIVE
Ketones, ur: NEGATIVE mg/dL
Nitrite: NEGATIVE
Protein, ur: NEGATIVE mg/dL
Specific Gravity, Urine: 1.004 — ABNORMAL LOW (ref 1.005–1.030)
Urobilinogen, UA: 0.2 mg/dL (ref 0.0–1.0)

## 2010-09-15 LAB — LIPID PANEL
HDL: 51 mg/dL (ref >39)
LDL Cholesterol: 101 mg/dL — ABNORMAL HIGH (ref 0–99)
Triglycerides: 36 mg/dL (ref <150)

## 2010-09-15 LAB — COMPREHENSIVE METABOLIC PANEL
ALT: 12 U/L (ref 0–35)
AST: 18 U/L (ref 0–37)
Albumin: 3.4 g/dL — ABNORMAL LOW (ref 3.5–5.2)
Alkaline Phosphatase: 76 U/L (ref 39–117)
CO2: 26 mEq/L (ref 19–32)
Chloride: 107 mEq/L (ref 96–112)
Creatinine, Ser: 0.87 mg/dL (ref 0.4–1.2)
GFR calc Af Amer: >60 mL/min (ref >60)
GFR calc non Af Amer: >60 mL/min (ref >60)
Potassium: 3.5 mEq/L (ref 3.5–5.1)
Total Bilirubin: 0.7 mg/dL (ref 0.3–1.2)

## 2010-09-15 LAB — TROPONIN I: Troponin I: 0.01 ng/mL (ref 0.00–0.06)

## 2010-09-15 LAB — POCT I-STAT, CHEM 8
Hemoglobin: 12.9 g/dL (ref 12.0–15.0)
Sodium: 141 mEq/L (ref 135–145)
TCO2: 27 mmol/L (ref 0–100)

## 2010-09-15 LAB — TSH: TSH: 0.518 u[IU]/mL (ref 0.350–4.500)

## 2010-09-15 LAB — CK TOTAL AND CKMB (NOT AT ARMC): Total CK: 99 U/L (ref 7–177)

## 2010-09-15 LAB — CARDIAC PANEL(CRET KIN+CKTOT+MB+TROPI): Troponin I: 0.01 ng/mL (ref 0.00–0.06)

## 2010-09-22 ENCOUNTER — Ambulatory Visit (INDEPENDENT_AMBULATORY_CARE_PROVIDER_SITE_OTHER): Payer: Medicare Other | Admitting: Family Medicine

## 2010-09-22 DIAGNOSIS — J029 Acute pharyngitis, unspecified: Secondary | ICD-10-CM

## 2010-09-24 ENCOUNTER — Encounter: Payer: Self-pay | Admitting: Family Medicine

## 2010-09-24 NOTE — Progress Notes (Signed)
  Subjective:    Patient ID: [name], female    DOB: August 25, 1940, 70 y.o.   MRN: [id number]  HPI    Review of Systems     Objective:   Physical Exam        Assessment & Plan:

## 2010-10-20 NOTE — H&P (Signed)
NAME:  [name], [name]                  ACCOUNT NO.:  [id number]   MEDICAL RECORD NO.:  [id number]          PATIENT TYPE:  INP   LOCATION:  NA                           FACILITY:  [facility]   PHYSICIAN:  Marlowe Kays, M.D.  DATE OF BIRTH:  [birth year]   DATE OF ADMISSION:  12/08/2006  DATE OF DISCHARGE:                              HISTORY & PHYSICAL   CHIEF COMPLAINT:  Pain in my left hip.   PRESENT ILLNESS:  This 70 year old white female seen by [place] for continued  and progressive problems concerning pain into her left hip.  She is  having more and more difficulty getting about, her quality of life is  markedly deteriorated secondary to her left hip pain.  She was unable to  drive or even walk short distances.  Examination x-rays have documented  severe osteoarthritis of the left hip.  After much consideration  including the risks and benefits of surgery, the patient agreed and Dr.  Sedonia Small recommended total hip replacement arthroplasty of the left hip.   Today during her history and physical, I answered all questions,  described to her the preoperative, intraoperative and postoperative  plans.  I again reiterated some of the risks and benefits of this  surgery.   PAST MEDICAL HISTORY:  Most significant is the patient has a history of  atrial fibrillation.  She is being treated by Dr. Julieanne Manson here in  [place].  She had cardioversion x2 in the past which has successfully  left her with sinus rhythm.  She is currently on Pacerone 200 mg daily  and seems to be controlling her atrial fibrillation.  She plans to see  Dr. Clarene Duke after our visit this morning.  I recommend that she ask him  for a cardiac clearance.   Other past medical history:  She has hypothyroidism for which she takes  Synthroid 125 mg daily.  She has reflux as well and intermittent  diarrhea and constipation.  She has had a diagnosis of pneumonia x3 here  in [place], and COPD/emphysema.   Other  medications:  1. Coumadin 2 mg except Saturday, which is 4 mg.  2. Cardizem 120 mg daily.  3. She uses Ultram for discomfort.   Dr. Julieanne Manson is her cardiologist.  She has no medical allergies.   SOCIAL HISTORY:  The patient is divorced, does not work.  Smoked up  until 3 years ago.  No intake of alcohol products.  Has one child and  the caregiver after surgery will be her friend Ilda Mori.  She lives in a  two-story house with 14 stairs and four and five stairs outside.   REVIEW OF SYSTEMS:  CNS:  No seizure disorder, paralysis, numbness,  double vision.  RESPIRATORY:  The patient has shortness of breath.  She  describes orthopnea.  This she relates to her COPD/emphysema.  No  hemoptysis.  CARDIOVASCULAR:  No chest pain, no angina or orthopnea.  GASTROINTESTINAL:  No nausea, vomiting, melena, or bloody stool but the  patient does have intermittent diarrhea, constipation, and reflux.  GENITOURINARY:  No discharge,  dysuria, hematuria.  MUSCULOSKELETAL:  Primarily in present illness with her hip.   PHYSICAL EXAMINATION:  GENERAL:  Alert and cooperative, friendly,  somewhat obese 23 year old white female who is accompanied by her friend  Corrie Dandy.  Venie is somewhat apprehensive today.  VITAL SIGNS:  Blood pressure 170/82, pulse 76 and regular.  Respirations  12, unlabored.  HEENT:  Normocephalic, PERRLA, EOM intact.  Oropharynx is clear with  missing upper front teeth.  NECK:  Supple, no lymphadenopathy.  CHEST:  Clear to auscultation.  No rhonchi, no rales, no wheezes.  HEART:  With regular rate and rhythm.  ABDOMEN:  Soft, obese, nontender.  Liver, spleen not felt.  GENITALIA, RECTAL, PELVIC, BREASTS:  Not done, not pertinent to present  illness.  EXTREMITIES:  Reveals painful range of motion of the left hip.  This  certainly is not attempted more than minimally due to her extreme pain.   ADMISSION DIAGNOSES:  1. Severe osteoarthritis of the left hip.  2. History of atrial  fibrillation.  3. Hypothyroidism.  4. Chronic obstructive pulmonary disease/emphysema.   PLAN:  I did recommend as mentioned above when she sees Dr. Clarene Duke for  her pro times to ask for a cardiac clearance.  She will come off the  Coumadin and the over-the-counter Advil and Aleve at least 7 days prior  to the surgery.  Should we have any medical problems while the patient  is in the hospitalization, then we will certainly call Dr. Clarene Duke who  not only is her cardiologist but generally takes care of her other  things.      Dooley L. Cherlynn June.    ______________________________  Marlowe Kays, M.D.    DLU/MEDQ  D:  11/29/2006  T:  11/29/2006  Job:  [id number]   cc:   Thereasa Solo. Little, M.D.  Fax: [phone]

## 2010-10-20 NOTE — Op Note (Signed)
[name], [name]                  ACCOUNT NO.:  [id number]   MEDICAL RECORD NO.:  [id number]          PATIENT TYPE:  INP   LOCATION:  1611                         [facility]ACILITY:  [facility]   PHYSICIAN:  Marlowe Kays, M.D.  DATE OF BIRTH:  [birth year]   DATE OF PROCEDURE:  12/07/2006  DATE OF DISCHARGE:                               OPERATIVE REPORT   PREOPERATIVE DIAGNOSIS:  Osteoarthritis and avascular necrosis, femoral  head, with collapse, left hip.   POSTOPERATIVE DIAGNOSIS:  Osteoarthritis and avascular necrosis, femoral  head, with collapse, left hip.   OPERATION:  Osteonics total hip replacement, left.   SURGEON:  Marlowe Kays, M.D.   ASSISTANT:  Georges Lynch. Darrelyn Hillock, M.D.   ANESTHESIA:  General.   PATHOLOGY AND JUSTIFICATION FOR PROCEDURE:  Stated in diagnosis.  She  was in significant pain.   PROCEDURE:  Prophylactic antibiotics, satisfactory general anesthesia,  right lateral decubitus position on the Kranz[place] II frame.  Left hip was  prepped with DuraPrep, draped in a sterile field, Ioban employed, time-  out performed.  A posterolateral incision down to the fascia lata.  Zickel's band was partially cut, external rotators were detached from  the femur.  A subtotal capsulectomy was performed, the hip dislocated,  and the initial femoral neck cut made right below the femoral head.  I  then cleared the piriformis fossa of soft tissue and placed a guide pin  down in it, overdrilling it, followed by canal finder, then began the  vertical reaming process up to a #9 and along the way I used the greater  trochanteric reamer and also made my final definitive cut on the femoral  neck.  I then began the rasping process up to a 9.  We reamed for the  distal tip at 13-1/2.  The capsulectomy was then completed in the  acetabulum and I then began the deepening, expanding reaming process,  working up to a 52 size acetabular shell, which fit nice and stably in  the hip.  Trial reduction was  performed and the hip was found be good  position.  Accordingly, I went ahead and placed the final acetabular  component, securing it with two screws, which were individually drilled,  measured and screwed.  The 10-degree on the acetabular liner for 36 mm  head was then placed, rotated at about 2 o'clock position.  Following  this the final size 9, 127-degree SecureFit prosthesis, press-fit, was  impacted gently and +0 and +5 neck lengths were tried and +5 gave what  seemed to be a more appropriate tightness.  The hip was stable in all  directions and was not too tight.  Accordingly, the final +5 C-taper  head was placed, hip reduced, and motion once again checked.  The hip  was stable.  The wound was irrigated sterile saline.  External rotators  were reattached with interrupted #1 Vicryl, fascia lata with the same,  the deep subcutaneous tissue with #1 Vicryl, superficial with 2-  0 Vicryl, and staples in the skin.  Betadine and Adaptic dry sterile  dressing were applied.  She  was gently placed on her back in an  abduction pillow and taken to recovery in satisfactory condition with no  known complications.  Estimated blood loss was 1600 mL, 2 units blood  replacement,           ______________________________  Marlowe Kays, M.D.     JA/MEDQ  D:  12/07/2006  T:  12/08/2006  Job:  [id number]

## 2010-10-20 NOTE — Discharge Summary (Signed)
[name], [name]                  ACCOUNT NO.:  [id number]   MEDICAL RECORD NO.:  [id number]          PATIENT TYPE:  INP   LOCATION:  1611                         FACILITY:  [facility]   PHYSICIAN:  Marlowe Kays, M.D.  DATE OF BIRTH:  01-07-1941   DATE OF ADMISSION:  12/07/2006  DATE OF DISCHARGE:                               DISCHARGE SUMMARY   ADMITTING DIAGNOSES:  1. Severe end-stage osteoarthritis, left hip.  2. Hypothyroidism.  3. History of atrial fibrillation.   DISCHARGE DIAGNOSES:  1. Severe end-stage osteoarthritis, left hip.  2. Hypothyroidism.  3. History of atrial fibrillation.  4. Postoperative anemia treated with transfusion.   OPERATION:  On December 07, 2006, the patient underwent left total hip  replacement arthroplasty.  Dr. Darrelyn Hillock assisted.   BRIEF HISTORY:  This 70 year old lady with end-stage osteoarthritis  presented to [place] with continuing problems concerning the left hip, more  and more difficulty in ambulating secondary to the left hip pain.  She  has been trying to persist with her level of activity, but the pain is  markedly interfering with her day-to-day desires.  X-rays and  examination are consistent with severe osteoarthritis of the left hip.  After much discussion including the risks and benefits of surgery, it  was decided she would benefit with surgical intervention and was  admitted for the above procedure.   COURSE IN THE HOSPITAL:  The patient tolerated the surgical procedure  quite well, a little slow early on with her level of activity, but she  gained more confidence.  She was able to maintain touchdown  weightbearing on the operative extremity.   It was noted that she had a postoperative anemia.  Hemoglobin dropped to  7.8.  Transfused her with 2 units of PRBC, bringing the hemoglobin up to  9.7.  She did much better after that.  There were no cardiac episodes  while in the hospital.  She was a bit concerned about her thyroid  levels;  however, this was not done and it would be best to have it done  by her internal medicine physician.   Postoperatively she continued with some serous-type of drainage to the  left hip.  She did have sensitivity to the tape as well.  No evidence of  infection.  Neurovascularly intact to the left lower extremity.  It was  felt she would be maintained in her home environment with friends  assisting as well as home health.  Once all these were in plan, it was  felt she could be maintained in her home environment.  Arrangements were  made for discharge.  Laboratory values in the hospital hematologically  showed a preoperative CBC essentially within normal limits.  Hemoglobin  was 12.5, hematocrit 35.7.  Final hemoglobin was 9.7.  Blood chemistries  remained normal.  The EGFR was 54.  Urinalysis negative for urinary  tract infection.  Chest x-ray showed mild to moderate cardiomegaly with  no evidence of congestive heart failure or other acute findings and mild  bibasilar scarring.  Electrocardiogram showed sinus rhythm with  premature atrial complexes.  CONDITION ON DISCHARGE:  Improved, stable.   PLAN:  The patient is to be discharged to her home.  She is to continue  with touchdown weightbearing to the left lower extremity.  She is to  return to see Dr. Sedonia Small 2 weeks after the date of surgery.  Continue  with home medications and diet.  Follow up with her internal medicine  physician per their instructions.   Our medications are Percocet for pain, Robaxin for muscle spasms,  Trinsicon to help build up the blood, and Coumadin, which she was on  preoperatively,  now will be under the direction of the pharmacy to  maintain the INR around 2.  This will be 4 weeks after the date of  surgery.  Today at bedside all questions were encouraged and answered,  and she is also encouraged to call us should she have any problems or  questions.      Dooley L. Cherlynn June.     ______________________________  Marlowe Kays, M.D.    DLU/MEDQ  D:  12/14/2006  T:  12/14/2006  Job:  [id number]

## 2010-10-23 NOTE — Cardiovascular Report (Signed)
NAME:  [name], [name]                              ACCOUNT NO.:  [id number]   MEDICAL RECORD NO.:  [id number]                   PATIENT TYPE:  OIB   LOCATION:  2855                                 FACILITY:  [facility]   PHYSICIAN:  Thereasa Solo. Little, M.D.              DATE OF BIRTH:  [birth year]   DATE OF PROCEDURE:  05/01/2003  DATE OF DISCHARGE:                              CARDIAC CATHETERIZATION   INDICATIONS FOR TEST:  This 70 year old female presented with atrial  fibrillation in early September.  Her echocardiogram showed normal LV  systolic function, left atrial dimension slightly increased at 4.2 cm.  This  occurred in the setting of a URI.   She has been anticoagulated appropriately for at least six weeks and comes  in today for outpatient cardioversion.   DESCRIPTION OF PROCEDURE:  After obtaining informed consent the patient was  given 100 mg of IV Pentothal by anesthesia.  When an appropriately level of  anesthesia was obtained the patient underwent elective cardioversion with  300 watt seconds.  With this she converted from atrial fibrillation with a  rate of about 100 to sinus rhythm with a rate of 85 with just occasional  premature supraventricular beats.   Her outpatient medications include Cardizem 180 mg CD daily, Tapazole 10 mg  b.i.d., and Coumadin 2.5 mg daily.   The patient is hyperthyroid, but is under appropriate control according to  her endocrinologist.  Will reevaluate in the office next week.                                               Thereasa Solo. Little, M.D.    ABL/MEDQ  D:  05/01/2003  T:  05/01/2003  Job:  [id number]   cc:   Delano Metz, M.D.  [address]  [place]  [place] [address]  Fax: [phone]

## 2010-10-23 NOTE — Cardiovascular Report (Signed)
NAME:  [name], MUCHOW                  ACCOUNT NO.:  [id number]   MEDICAL RECORD NO.:  [id number]          PATIENT TYPE:  OIB   LOCATION:  2899                         FACILITY:  [facility]   PHYSICIAN:  Thereasa Solo. Little, M.D. DATE OF BIRTH:  04-Apr-1941   DATE OF PROCEDURE:  DATE OF DISCHARGE:  06/03/2006                            CARDIAC CATHETERIZATION   CARDIOVERSION NOTE   This 70 year old female has had atrial fibrillation as a result of being  hyperthyroid.  She is now euthyroid and is brought in for elective  outpatient cardioversion.  She is on p.o. Cardizem.   After obtaining an appropriate level of anesthesia with 300 mg of IV  Pentothal, the patient underwent elective cardioversion.   A 150 watt seconds initially converted the patient to sinus rhythm with  frequent PABs and a short burst of atrial fibrillation.  A second  cardioversion with 200 watt seconds resulted in sinus rhythm with  frequent PABs but no reoccurrence of the atrial fibrillation.   After anesthesia, the patient is now talkative and moving all 4  extremities. She will be discharged to home later today.  Outpatient  medications remain the same. Her INR was therapeutic at 2.2.           ______________________________  Thereasa Solo. Little, M.D.     ABL/MEDQ  D:  06/03/2006  T:  06/03/2006  Job:  [id number]   cc:   Joni Fears D. Maple Hudson, MD, FCCP, FACP  Dorisann Frames, M.D.  [facility]  Gaspar Garbe B. Little, M.D.

## 2010-10-23 NOTE — Discharge Summary (Signed)
[name], [name] NO.:  [id number]   MEDICAL RECORD NO.:  [id number]          PATIENT TYPE:  INP   LOCATION:  3016                         FACILITY:  [facility]   PHYSICIAN:  Paula Compton, MD        DATE OF BIRTH:  1941-01-21   DATE OF ADMISSION:  10/07/2008  DATE OF DISCHARGE:  10/09/2008                               DISCHARGE SUMMARY   PRIMARY CARE Zola Runion:  The patient is unassigned.   CARDIOLOGIST:  Thereasa Solo. Little, MD at [facility].   DISCHARGE DIAGNOSES:  1. Transient ischemic attack.  2. Paroxysmal atrial fibrillation.  3. Bradycardia.  4. Hypertension.  5. Hypothyroidism.   DISCHARGE MEDICATIONS:  1. Cardizem and 180 mg once daily, this is a new dose.  2. Lisinopril 10 mg once daily.  3. Synthroid 137 mcg once daily.  4. Protonix 40 mg once daily.  5. Simvastatin 20 mg once daily, this is a new medication.  6. Coumadin 5 mg once daily.  7. Aspirin 81 mg once daily.  8. Lovenox 100 mg subcutaneously twice daily for 5 days.   CONSULTS:  Cardiology.   PROCEDURES:  1. Chest x-ray on Oct 07, 2008, that showed mild cardiomegaly without      congestive heart failure.  Lungs were mildly hyper aerated with      scarring at the left base.  No acute process of heart failure.  No      clear fluids.  2. CT head without contrast on Oct 07, 2008, showed no acute      intracranial findings.  Microvascular ischemic change was present.   LABORATORY DATA:  1. CBC with white blood cell 8.3, hemoglobin 11.1, hematocrit 33.3,      and platelets 227.  2. Heparin level is 0.36, INR 1.3.  PT 16.4.  3. TSH 0.518.  4. Fasting lipid panel showed total cholesterol 159, triglyceride 36,      LDH 51, cholesterol 101.  5. CM ET was within normal limits with the exception of albumin that      was 3.4.  6. Serial cardiac enzymes were negative.   BRIEF HOSPITAL COURSE:  This is a 70 year old female with a history of  AFib with TIA symptoms on  admission.  1. TIA.  The patient was admitted with report of slurred speech.  On      admission, her symptoms of slurred speech had resolved.  She has      remained asymptomatic during this hospitalization.  She had carotid      Doppler ultrasound that was negative for stenosis bilaterally.  A 2-      D echo on Oct 08, 2008, that showed an ejection fraction of 50-55%.      Doppler parameters were consistent with abnormal left ventricular      relaxation, grade 1 diastolic function.  TSH during this      hospitalization was within normal limits at 0.518.  The cause of      her TIA symptoms may be secondary to paroxysmal atrial fibrillation  since the patient has not been on Coumadin since at least 2008.      The patient was previously on Coumadin for AFib, but took herself      off of Coumadin without recommendation of her cardiologist, Dr.      Clarene Duke.  Dr. Clarene Duke saw the patient during this hospitalization and      he recommended that the patient be placed on Lovenox with Coumadin      bridging and be therapeutic with her INR prior to discontinuation      of Lovenox since the patient is at increased risk for CVA.  The      patient does not need to remain in the hospital for this treatment      and home health was consulted to assist the patient with receiving      Lovenox for home treatment.  The patient will be discharged home      with 5 doses of Lovenox with Coumadin bridging.  The patient will      take Coumadin 5 mg daily and Lovenox daily for 5 days.  She will be      monitored at the Coumadin Clinic at [facility]      until she is therapeutic and Lovenox can be discontinued.  2. Atrial fibrillation.  At one time, the patient was on Coumadin, but      this was at least 2 years ago.  The patient will be on Lovenox with      Coumadin bridging until she is therapeutic with INR 21 between 2-3.      She will be followed at the Coumadin Clinic.  3. Bradycardia.   Prior to discharge, her heart rate was in the 50s and      60s.  This was most likely secondary to the increased dose of the      Cardizem.  She was previously on Cardizem 120 mg, Dr. Clarene Duke      increased her dose to 180 mg during this hospitalization.  This      should be monitored as an outpatient basis.  4. Hypertension.  Her blood pressure has not been well controlled.      She will continue to take Cardizem 180 mg and lisinopril 10 mg and      be followed by Dr. Clarene Duke on an outpatient basis.  5. Hypothyroidism.  She is to continue on her home dose of Synthroid      137 mcg.  TSH during this hospitalization was normal at 0.518.   DISCHARGE INSTRUCTIONS:  1. Activity:  Increase activity slowly.  2. Diet:  Low-sodium heart-healthy diet.  3. Wound care not applicable.   FOLLOWUP APPOINTMENTS:  The patient is to establish primary care with  Dr. Raquel James, phone number [phone].  The patient has appointment to follow up with Dr. Clarene Duke at [facility] on Oct 16, 2008, at 9:20.  The patient is to return to Coumadin Clinic at [facility]  [facility] on Oct 10, 2008, at 2 p.m.      Angeline Slim, MD  Electronically Signed      Paula Compton, MD  Electronically Signed    CT/MEDQ  D:  10/10/2008  T:  10/11/2008  Job:  [id number]

## 2010-11-06 ENCOUNTER — Other Ambulatory Visit: Payer: Self-pay | Admitting: Orthopedic Surgery

## 2010-11-06 ENCOUNTER — Encounter (HOSPITAL_COMMUNITY): Payer: Medicare Other

## 2010-11-06 LAB — BASIC METABOLIC PANEL
BUN: 13 mg/dL (ref 6–23)
Creatinine, Ser: 0.94 mg/dL (ref 0.4–1.2)
GFR calc Af Amer: >60 mL/min (ref >60)
GFR calc non Af Amer: 59 mL/min — ABNORMAL LOW (ref >60)
Potassium: 4 mEq/L (ref 3.5–5.1)

## 2010-11-06 LAB — CBC
MCV: 89.2 fL (ref 78.0–100.0)
Platelets: 298 10*3/uL (ref 150–400)
RDW: 13.6 % (ref 11.5–15.5)
WBC: 6.9 10*3/uL (ref 4.0–10.5)

## 2010-11-06 LAB — SURGICAL PCR SCREEN: Staphylococcus aureus: NEGATIVE

## 2010-11-06 LAB — PROTIME-INR: Prothrombin Time: 17.3 seconds — ABNORMAL HIGH (ref 11.6–15.2)

## 2010-11-11 ENCOUNTER — Inpatient Hospital Stay (HOSPITAL_COMMUNITY)
Admission: RE | Admit: 2010-11-11 | Discharge: 2010-11-16 | DRG: 470 | Disposition: A | Discharge status: Skilled Nursing Facility | Payer: Medicare Other | Source: Ambulatory Visit | Attending: Orthopedic Surgery | Admitting: Orthopedic Surgery

## 2010-11-11 ENCOUNTER — Inpatient Hospital Stay (HOSPITAL_COMMUNITY): Payer: Medicare Other

## 2010-11-11 DIAGNOSIS — J449 Chronic obstructive pulmonary disease, unspecified: Secondary | ICD-10-CM | POA: Diagnosis present

## 2010-11-11 DIAGNOSIS — M1610 Unilateral primary osteoarthritis, unspecified hip: Principal | ICD-10-CM | POA: Diagnosis present

## 2010-11-11 DIAGNOSIS — I4891 Unspecified atrial fibrillation: Secondary | ICD-10-CM | POA: Diagnosis present

## 2010-11-11 DIAGNOSIS — D62 Acute posthemorrhagic anemia: Secondary | ICD-10-CM | POA: Diagnosis not present

## 2010-11-11 DIAGNOSIS — I2510 Atherosclerotic heart disease of native coronary artery without angina pectoris: Secondary | ICD-10-CM | POA: Diagnosis present

## 2010-11-11 DIAGNOSIS — Z01818 Encounter for other preprocedural examination: Secondary | ICD-10-CM

## 2010-11-11 DIAGNOSIS — Z8673 Personal history of transient ischemic attack (TIA), and cerebral infarction without residual deficits: Secondary | ICD-10-CM

## 2010-11-11 DIAGNOSIS — Z96659 Presence of unspecified artificial knee joint: Secondary | ICD-10-CM

## 2010-11-11 DIAGNOSIS — J4489 Other specified chronic obstructive pulmonary disease: Secondary | ICD-10-CM | POA: Diagnosis present

## 2010-11-11 DIAGNOSIS — M169 Osteoarthritis of hip, unspecified: Principal | ICD-10-CM | POA: Diagnosis present

## 2010-11-11 LAB — HEMOGLOBIN AND HEMATOCRIT, BLOOD
HCT: 31.2 % — ABNORMAL LOW (ref 36.0–46.0)
Hemoglobin: 10.7 g/dL — ABNORMAL LOW (ref 12.0–15.0)

## 2010-11-12 LAB — TYPE AND SCREEN: Antibody Screen: NEGATIVE

## 2010-11-12 LAB — PROTIME-INR
INR: 1.31 (ref 0.00–1.49)
Prothrombin Time: 16.5 seconds — ABNORMAL HIGH (ref 11.6–15.2)

## 2010-11-12 LAB — CBC
Hemoglobin: 10.4 g/dL — ABNORMAL LOW (ref 12.0–15.0)
RBC: 3.44 MIL/uL — ABNORMAL LOW (ref 3.87–5.11)

## 2010-11-13 LAB — CBC
Hemoglobin: 8.4 g/dL — ABNORMAL LOW (ref 12.0–15.0)
MCH: 30.4 pg (ref 26.0–34.0)
MCHC: 34 g/dL (ref 30.0–36.0)
Platelets: 183 10*3/uL (ref 150–400)

## 2010-11-13 LAB — PROTIME-INR: Prothrombin Time: 24.5 seconds — ABNORMAL HIGH (ref 11.6–15.2)

## 2010-11-14 LAB — CBC
Hemoglobin: 8 g/dL — ABNORMAL LOW (ref 12.0–15.0)
MCH: 30.9 pg (ref 26.0–34.0)
MCHC: 34.6 g/dL (ref 30.0–36.0)

## 2010-11-14 LAB — PROTIME-INR: Prothrombin Time: 32.6 seconds — ABNORMAL HIGH (ref 11.6–15.2)

## 2010-11-15 LAB — PROTIME-INR: Prothrombin Time: 28.1 seconds — ABNORMAL HIGH (ref 11.6–15.2)

## 2010-11-16 LAB — HEMOGLOBIN AND HEMATOCRIT, BLOOD: HCT: 29 % — ABNORMAL LOW (ref 36.0–46.0)

## 2010-11-16 LAB — CROSSMATCH
ABO/RH(D): AB POS
Antibody Screen: NEGATIVE
Unit division: 0
Unit division: 0

## 2010-11-18 NOTE — Discharge Summary (Addendum)
[name], [name]                  ACCOUNT NO.:  [id number]  MEDICAL RECORD NO.:  [id number]  LOCATION:  1601                         FACILITY:  [facility]  PHYSICIAN:  Marlowe Kays, M.D.  DATE OF BIRTH:  [birth year]  DATE OF ADMISSION:  11/11/2010 DATE OF DISCHARGE:  11/16/2010                        DISCHARGE SUMMARY - REFERRING   ADMITTING DIAGNOSES: 1. Osteoarthritis of the right hip. 2. Atrial fibrillation. 3. Chronic obstructive pulmonary disease. 4. Coronary artery disease.  DISCHARGE DIAGNOSES: 1. Osteoarthritis of the right hip. 2. Atrial fibrillation. 3. Chronic obstructive pulmonary disease. 4. Coronary artery disease. 5. Postoperative acute blood loss anemia, treated with transfusion.  OPERATION:  On Sharp 6, 2012, the patient underwent [employer] total hip replacement arthroplasty to the right hip.  BRIEF HISTORY:  This 70 year old white female has had problems concerning the right hip for some time now.  She has had increasing limitation of day to day activities.  Once we differentiate between spinal stenosis, it was noted that she continued with her hip pain.  She had successful hip done on the other side and it was felt that she would benefit from surgical intervention with the right total hip replacement arthroplasty.  COURSE IN THE HOSPITAL:  The patient tolerated the surgical procedure quite well.  She did have little difficulty with her medications since the Percocet caused hallucinatory problems and once that was discontinued, she had less problems.  The patient worked well with Physical Therapy entering into the total hip protocol, understanding the directions for her 50% weightbearing on the operative side.  There were no critical incidences while she was in the hospital.  We did note that she had drop in her hemoglobin to 7.9. Over the weekend, she was transfused with 2 units of packed cells bringing her hemoglobin up to 10.2 and hematocrit of 29.  We  had no critical incidents or any medical problems throughout the hospitalization.  Prior to discharge, she had BM, no problems with her hip, wound stayed clean and dry.  The patient for comfort and confidence as well as increasing her level of activities will go to rehab afterwards so as to assist in her return to normal life.  LABORATORY DATA:  Laboratory values in the hospital showed a preoperative hemoglobin of 10.7, hematocrit was 31.2.  Again final hemoglobin was 10.2, hematocrit 29.0.  Overall the patient did very well postoperatively and it was felt she could be maintained in nursing facility and arrangements were made for that transfer.  CURRENT MEDICATIONS: 1. Enteric-coated aspirin 1 tablet q.a.m. 2. Tiazac/Cardizem CD/ER 1 capsule twice daily. 3. Flexeril 10 mg for muscle spasm. 4. Flecainide 100 mg 1 tablet twice daily. 5. Furosemide 40 mg tabs one daily as needed. 6. Synthroid 125 mcg 1 tablet q.a.m. 7. Vitamin B6 one tab daily. 8. Vitamin D3 1000 units 1 tablet every morning. 9. Coumadin 2.5 mg one each evening.  She will need to have her protimes and INR drawn 2 days from this dictation and then regularly to try to keep the INR between 2 and 3.  We will write a prescription for her Vicodin for her discomfort.  Should there be any medical problems, they need  to call Dr. Sharlot Gowda; any cardiac problems, Dr. Julieanne Manson.  ADDENDUM:  The patient is to be seen in Dr. Leim Fabry office in about 2 to 3 weeks after day of surgery.  She can call for an appointment at [phone].  Also use dry dressing p.r.n. to the surgical wound.     [name].   ______________________________ Marlowe Kays, M.D.    DLU/MEDQ  D:  11/16/2010  T:  11/16/2010  Job:  [id number]  cc:   Sharlot Gowda, M.D. Fax: [phone]  Thereasa Solo. Little, M.D. Fax: [phone]  Dorisann Frames, M.D. Fax: [phone]  Electronically Signed by Marlowe Kays M.D. on 11/18/2010  12:56:43 PM Electronically Signed by [name]  on 11/27/2010 04:41:51 PM

## 2010-11-18 NOTE — H&P (Addendum)
[name], [name] NO.:  [id number]  MEDICAL RECORD NO.:  [id number]  LOCATION:                                 FACILITY:  PHYSICIAN:  Marlowe Kays, M.D.  DATE OF BIRTH:  August 11, 1940  DATE OF ADMISSION:  11/11/2010 DATE OF DISCHARGE:                             HISTORY & PHYSICAL   CHIEF COMPLAINT:  Pain in right hip and groin.  PRESENT ILLNESS:  This 70 year old white female is seen by [place] for continued progressive problems concerning the right hip.  She has had progressive problems with this hip with osteoarthritis in the hip which has given her increasing limitation of with her day-to-day activities. We have tried to differentiate between the spinal stenosis which she has which is quite noticeable with the hip and appears to come to the decision that a total hip replacement arthroplasty is indicated.  She has had a right hip total arthroplasty done very well with that and after much discussion including the risks and benefits of surgery we decided to go ahead with total hip replacement arthroplasty on the right.  PAST MEDICAL HISTORY:  The patient does have COPD, mini stroke was in May 2010.  She also has atrial fibrillation which according to her is more of paroxysmal type situation.  PAST SURGICAL HISTORY:  Past surgeries include total knee replacement arthroplasty in July of 2008.  CURRENT MEDICATIONS:  She has been on Coumadin 2.5 mg tabs, has stopped that as well as the aspirin 81 mg.  She continues on Cardizem 800 mg b.i.d., Synthroid 137 mcg daily, Lasix 40 mg daily, Advil p.r.n. which she has not taken either, flecainide 100 mg b.i.d., fish oil infrequently  (has stopped that as well).  Also vitamin D and vitamin B6.  ALLERGIES:  She has no true medical allergies, however, STATIN causes muscle pain.  She has recently been seen by Dr. Donnie Aho at [facility].  She was seen by him on Oct 28, 2010.  He has a decided  that she is a moderate risk patient secondary to her cardiovascular situation. He has recommended that aspirin and Coumadin be held 5 to 6 days prior to hip replacement.  We will start anticoagulation immediately after the surgery per his recommendation.  SOCIAL HISTORY:  The patient is divorced, has no intake of alcohol or tobacco products.  She lives in two-level home.  FAMILY HISTORY:  Positive for mother 34 in relatively good state of health.  One brother deceased, one son with history of cancer which has been treated.  Dr. Donnie Aho is her cardiologist, Dr. Sharlot Gowda is her family physician and Dr. Talmage Nap is her endocrinologist.  REVIEW OF SYSTEMS:  CNS:  No seizures, paralysis, numbness or double vision.  She has a stroke in the past, minimal sequelae. CARDIOVASCULAR:  No chest pain, no angina or orthopnea. RESPIRATORY:  The patient has shortness of breath with exertion.  No hemoptysis. GASTROINTESTINAL:  No nausea, vomiting, melena or bloody stool. GENITOURINARY:  No discharge, dysuria, hematuria. MUSCULOSKELETAL:  Primarily in present illness.  She does have back pain and some buttock type pain.  PHYSICAL EXAMINATION:  GENERAL:  Alert, cooperative,  friendly 70 year old white female who is 5 feet 3 and 220 pounds.  She is seen in wheelchair.  She is accompanied by her friend. VITAL SIGNS:  Blood pressure 150/78 in left arm seated, respirations 16, pulse 78 and regular. HEENT:  Normocephalic.  PERRLA.  EOMI intact.  Oropharynx is clear. CHEST:  Clear to auscultation.  No rhonchi or rales. HEART:  Regular rate and rhythm.  There is a grade 2/6 murmur. ABDOMEN:  Soft, nontender.  Bowel sounds present. GENITALIA:  Not done, not pertinent to present illness. RECTAL:  Not done, not pertinent to present illness. PELVIC:  Not done, not pertinent to present illness. BREASTS:  Not done, not pertinent to present illness. EXTREMITIES:  The patient has pain with range of motion of  right hip and when she tends to stand at our request.  ADMISSION DIAGNOSES: 1. End-stage osteoarthritis of the right hip. 2. Atrial fibrillation. 3. Chronic obstructive pulmonary disease. 4. Coronary artery disease .  PLAN:  The patient will undergo total hip replacement arthroplasty of the right hip.  We will certainly ask Dr. Nile Dear al as well as Dr. Donnie Aho to follow along with [place] should we have any episodes needing their expertise.  All probability the patient will need inpatient post hospital rehabilitation and then home health.     [name].   ______________________________ Marlowe Kays, M.D.    DLU/MEDQ  D:  11/10/2010  T:  11/10/2010  Job:  [id number]  cc:   Sharlot Gowda, M.D. Fax: [phone]  Thereasa Solo. Little, M.D. Fax: [phone]  Dorisann Frames, M.D. Fax: [phone]  Marlowe Kays, M.D. Fax: [phone]  Electronically Signed by Marlowe Kays M.D. on 11/18/2010 12:56:36 PM Electronically Signed by [name]  on 11/27/2010 04:42:11 PM

## 2010-11-18 NOTE — Op Note (Signed)
NAMEJONEL, SICK NO.:  [id number]  MEDICAL RECORD NO.:  [id number]  LOCATION:  1601                         FACILITY:  [facility]  PHYSICIAN:  Marlowe Kays, M.D.  DATE OF BIRTH:  [birth year]  DATE OF PROCEDURE:  11/11/2010 DATE OF DISCHARGE:                              OPERATIVE REPORT   PREOPERATIVE DIAGNOSIS:  Osteoarthritis, right leg.  POSTOPERATIVE DIAGNOSIS:  Osteoarthritis, right leg.  OPERATION:  Osteonics total hip replacement, right.  SURGEON:  Marlowe Kays, M.D.  ASSISTANT:  Georges Lynch. Darrelyn Hillock, M.D.  ANESTHESIA:  General.  DESCRIPTION OF PROCEDURE:  She has had a successful total hip replacement by me on the left.  Her present episode of pain for that she is confined to wheelchair with complete obliteration of the hip joint. Also had a 1/2- inch shortening and she is desirous of trying to get this length back if at all possible.  PROCEDURE:  She was cleared for surgery by her cardiologist. Prophylactic antibiotics, satisfied general anesthesia, Foley catheter inserted with care to protect the left total hip.  Left lateral decubitus position on the Mark II frame, right hip area was prepped with DuraPrep and draped in a sterile field.  Collier Flowers was employed. Posterolateral incision down to the fascia lata, __________  was cut and external rotators detached from the femur with cutting cautery.  Partial capsulectomy was performed sufficient enough to dislocate the hip, which I then amputated the femoral neck just below the femoral head for immediate exposure.  The piriformis fossa was cleared off soft tissue and with a box chisel I made an opening in the piriformis fossa and femoral neck.  I then used a canal finder to locate the canal and followed this with vertical reamers up to #8.  She had a size 9 on the contralateral side then followed with a rasp up to a size 8, which gave a nice tight fit.  Returning to the acetabulum, I then  completed the capsulectomy and began reaming the acetabulum.  It worked best to go to a 52 with a 54 trial liner fitting snugly.  Accordingly we went ahead with the final 54-mm piece of acetabular shell, which I then stabilized with 2 cancellous bone screws, 25 and 30 mm in length.  We then placed the 46 mm liner in the acetabular shell and inserted carefully sized __________ degree femoral component.  I then went through different size head and heights and found that a 2.5 mm C tapered head was the best fit and transposed stability and also trying to restore her length.  The final head and cap were then placed and the hip reduced and found to be nice and stable with no shucking.  The wound was then closed in layers following irrigation with interrupted #1 Vicryl in the fascia and 2 layers of the subcutaneous tissue deep and 2-0 Vicryl superficially and staples on the skin.  I then placed Betadine, Adaptic, and Mepilex dressing.  She was gently moved onto her PACU bed, placed in an abduction pillow.  Leg lengths appeared to be up and equal and toes were up.  She tolerated the procedure well.  Blood loss  was roughly 1850 cc. She was given 1 unit of blood in the operating room with another to follow in the PACU.          ______________________________ Marlowe Kays, M.D.     JA/MEDQ  D:  11/11/2010  T:  11/12/2010  Job:  [id number]  Electronically Signed by Marlowe Kays M.D. on 11/18/2010 12:56:31 PM

## 2010-12-07 ENCOUNTER — Ambulatory Visit: Payer: Medicare Other | Admitting: Family Medicine

## 2011-03-05 ENCOUNTER — Encounter: Payer: Self-pay | Admitting: Family Medicine

## 2011-03-11 ENCOUNTER — Ambulatory Visit
Admission: RE | Admit: 2011-03-11 | Discharge: 2011-03-11 | Disposition: A | Discharge status: Home / Self Care | Payer: Medicare Other | Source: Ambulatory Visit | Attending: Family Medicine | Admitting: Family Medicine

## 2011-03-11 ENCOUNTER — Encounter: Payer: Self-pay | Admitting: Family Medicine

## 2011-03-11 ENCOUNTER — Ambulatory Visit (INDEPENDENT_AMBULATORY_CARE_PROVIDER_SITE_OTHER): Payer: Medicare Other | Admitting: Family Medicine

## 2011-03-11 VITALS — BP 132/76 | HR 72 | Ht 63.0 in | Wt 224.0 lb

## 2011-03-11 DIAGNOSIS — R609 Edema, unspecified: Secondary | ICD-10-CM

## 2011-03-11 DIAGNOSIS — Z23 Encounter for immunization: Secondary | ICD-10-CM

## 2011-03-11 DIAGNOSIS — Z8679 Personal history of other diseases of the circulatory system: Secondary | ICD-10-CM

## 2011-03-11 DIAGNOSIS — I498 Other specified cardiac arrhythmias: Secondary | ICD-10-CM

## 2011-03-11 DIAGNOSIS — R001 Bradycardia, unspecified: Secondary | ICD-10-CM

## 2011-03-11 DIAGNOSIS — R600 Localized edema: Secondary | ICD-10-CM

## 2011-03-11 LAB — CBC WITH DIFFERENTIAL/PLATELET
Basophils Absolute: 0 10*3/uL (ref 0.0–0.1)
Basophils Relative: 0 % (ref 0–1)
HCT: 38.4 % (ref 36.0–46.0)
MCHC: 33.9 g/dL (ref 30.0–36.0)
Monocytes Absolute: 0.5 10*3/uL (ref 0.1–1.0)
Neutro Abs: 4 10*3/uL (ref 1.7–7.7)
Neutrophils Relative %: 62 % (ref 43–77)
Platelets: 313 10*3/uL (ref 150–400)
RDW: 13.8 % (ref 11.5–15.5)

## 2011-03-11 LAB — LIPID PANEL
HDL: 59 mg/dL (ref >39)
LDL Cholesterol: 101 mg/dL — ABNORMAL HIGH (ref 0–99)
Total CHOL/HDL Ratio: 2.9 Ratio

## 2011-03-11 LAB — COMPREHENSIVE METABOLIC PANEL
AST: 24 U/L (ref 0–37)
Albumin: 4.2 g/dL (ref 3.5–5.2)
Alkaline Phosphatase: 93 U/L (ref 39–117)
BUN: 15 mg/dL (ref 6–23)
Potassium: 3.8 mEq/L (ref 3.5–5.3)

## 2011-03-11 NOTE — Progress Notes (Signed)
  Subjective:    Patient ID: [name], female    DOB: March 30, 1941, 70 y.o.   MRN: [id number]  HPI She has a two-week history of bilateral leg edema and to a lesser extent her hands. No chest pain, shortness of breath, DOE, palpitations or PND. No medication change. She had a cardioversion in January for her atrial fibrillation which was successful. She has been on a diuretic for the last 2 weeks.  Review of Systems     Objective:   Physical Exam alert and in no distress. Tympanic membranes and canals are normal. Throat is clear. Tonsils are normal. Neck is supple without adenopathy or thyromegaly. Cardiac exam shows a regular sinus rhythm without murmurs or gallops. Lungs are clear to auscultation. EKG shows a bradycardia as well as questionable borderline first degree block. Chest x-ray shows no acute changes       Assessment & Plan:   1. Peripheral edema  PR ELECTROCARDIOGRAM, COMPLETE, CBC with Differential, Comprehensive metabolic panel, Lipid panel, DG Chest 2 View, Ambulatory referral to Cardiology  2. History of atrial fibrillation  PR ELECTROCARDIOGRAM, COMPLETE, CBC with Differential, Comprehensive metabolic panel, Lipid panel, DG Chest 2 View  3. Immunization due    4. Need for diphtheria-tetanus-pertussis (Tdap) vaccine, adult/adolescent  Tdap vaccine greater than or equal to 7yo IM  5. Bradycardia     routine blood work. Refer to cardiology. Continue on furosemide at the present dosing.

## 2011-03-11 NOTE — Patient Instructions (Signed)
Take the fluid pill every day. We'll get you an appointment with Dr. Clarene Duke

## 2011-03-23 LAB — PROTIME-INR
INR: 1.7 — ABNORMAL HIGH
INR: 1.7 — ABNORMAL HIGH
INR: 2.3 — ABNORMAL HIGH
INR: 2.4 — ABNORMAL HIGH
INR: 2.8 — ABNORMAL HIGH
INR: 2.9 — ABNORMAL HIGH
Prothrombin Time: 21 — ABNORMAL HIGH
Prothrombin Time: 27.8 — ABNORMAL HIGH
Prothrombin Time: 30.8 — ABNORMAL HIGH

## 2011-03-23 LAB — CBC
MCV: 88.3
Platelets: 167
Platelets: 209
RBC: 2.76 — ABNORMAL LOW
WBC: 11.8 — ABNORMAL HIGH
WBC: 7.6
WBC: 8.4

## 2011-03-23 LAB — ABO/RH: ABO/RH(D): AB POS

## 2011-03-23 LAB — TYPE AND SCREEN: ABO/RH(D): AB POS

## 2011-03-23 LAB — POCT I-STAT 4, (NA,K, GLUC, HGB,HCT): Hemoglobin: 8.8 — ABNORMAL LOW

## 2011-03-23 LAB — APTT: aPTT: 31

## 2011-03-23 LAB — PREPARE RBC (CROSSMATCH)

## 2011-03-23 LAB — HEMOGLOBIN AND HEMATOCRIT, BLOOD: HCT: 32.4 — ABNORMAL LOW

## 2011-03-24 LAB — CBC
HCT: 35.7 — ABNORMAL LOW
Platelets: 253
WBC: 5.1

## 2011-03-24 LAB — COMPREHENSIVE METABOLIC PANEL
ALT: 13
AST: 20
Albumin: 3.8
Alkaline Phosphatase: 63
BUN: 13
Chloride: 107
GFR calc Af Amer: >60
Potassium: 4
Sodium: 141
Total Bilirubin: 0.9

## 2011-03-24 LAB — URINALYSIS, ROUTINE W REFLEX MICROSCOPIC
Glucose, UA: NEGATIVE
Protein, ur: NEGATIVE

## 2011-03-24 LAB — DIFFERENTIAL
Basophils Absolute: 0
Basophils Relative: 1
Eosinophils Relative: 1
Monocytes Absolute: 0.4

## 2011-05-28 ENCOUNTER — Telehealth: Payer: Self-pay | Admitting: Family Medicine

## 2011-05-28 NOTE — Telephone Encounter (Signed)
DONE

## 2011-07-19 ENCOUNTER — Ambulatory Visit
Admission: RE | Admit: 2011-07-19 | Discharge: 2011-07-19 | Disposition: A | Discharge status: Home / Self Care | Payer: Medicare Other | Source: Ambulatory Visit | Attending: Family Medicine | Admitting: Family Medicine

## 2011-07-19 ENCOUNTER — Ambulatory Visit (INDEPENDENT_AMBULATORY_CARE_PROVIDER_SITE_OTHER): Payer: Medicare Other | Admitting: Family Medicine

## 2011-07-19 ENCOUNTER — Encounter: Payer: Self-pay | Admitting: Family Medicine

## 2011-07-19 VITALS — BP 136/90 | HR 64 | Wt 229.0 lb

## 2011-07-19 DIAGNOSIS — Z8679 Personal history of other diseases of the circulatory system: Secondary | ICD-10-CM

## 2011-07-19 DIAGNOSIS — E039 Hypothyroidism, unspecified: Secondary | ICD-10-CM

## 2011-07-19 DIAGNOSIS — R0989 Other specified symptoms and signs involving the circulatory and respiratory systems: Secondary | ICD-10-CM

## 2011-07-19 DIAGNOSIS — Z7901 Long term (current) use of anticoagulants: Secondary | ICD-10-CM

## 2011-07-19 LAB — CBC WITH DIFFERENTIAL/PLATELET
Basophils Absolute: 0 10*3/uL (ref 0.0–0.1)
Basophils Relative: 0 % (ref 0–1)
Eosinophils Absolute: 0 10*3/uL (ref 0.0–0.7)
MCH: 29.9 pg (ref 26.0–34.0)
MCHC: 32.7 g/dL (ref 30.0–36.0)
Monocytes Absolute: 0.7 10*3/uL (ref 0.1–1.0)
Neutro Abs: 7.3 10*3/uL (ref 1.7–7.7)
Neutrophils Relative %: 76 % (ref 43–77)
RDW: 13.8 % (ref 11.5–15.5)

## 2011-07-19 MED ORDER — NITROGLYCERIN 0.4 MG SL SUBL: 0.4000 mg | SUBLINGUAL_TABLET | SUBLINGUAL | Status: DC | PRN

## 2011-07-19 NOTE — Patient Instructions (Signed)
If you gets chest pain and it doesn't go away within a few minutes take one nitroglycerin and if it has not gone in 5 minutes he, take another one and then call an ambulance

## 2011-07-19 NOTE — Progress Notes (Signed)
  Subjective:    Patient ID: [name], female    DOB: 07/27/1940, 71 y.o.   MRN: [id number]  HPI She is here for evaluation of a two-week history of difficulty with shortness of breath with activity. She notes when she takes her dog for a walk or goes up a flight of stairs that she becomes short of breath but has not noticed any heart rate changes PND or chest pain. She does have an underlying history of atrial fibrillation and continues on medications listed in the chart. She also has a previous history of COPD. In the past she had been given Spiriva and Advair but stopped both Nedra Hai stating the she did not think they helped. She does have underlying hypothyroid and did have a TSH done in December which was normal.  Review of Systems     Objective:   Physical Exam Alert and in no distress. Cardiac exam shows a regular rhythm without murmurs or gallops. Lungs are clear to auscultation. Extremity exam shows 1-2+ pitting edema. EKG shows no acute changes       Assessment & Plan:   1. DOE (dyspnea on exertion)  PR ELECTROCARDIOGRAM, COMPLETE, DG Chest 2 View, CBC with Differential, Comprehensive metabolic panel, Lipid panel  2. History of atrial fibrillation    3. HYPOTHYROIDISM    4. Encounter for long-term (current) use of anticoagulants     her case was discussed with Dr. Clarene Duke. We will do routine blood screening and get a BNP Dr. Fredirick Maudlin office will call the patient to set up an appointment later this week

## 2011-07-20 LAB — LIPID PANEL
HDL: 69 mg/dL (ref >39)
LDL Cholesterol: 95 mg/dL (ref 0–99)
Triglycerides: 39 mg/dL (ref <150)
VLDL: 8 mg/dL (ref 0–40)

## 2011-07-20 LAB — COMPREHENSIVE METABOLIC PANEL
AST: 19 U/L (ref 0–37)
Albumin: 4.4 g/dL (ref 3.5–5.2)
Alkaline Phosphatase: 103 U/L (ref 39–117)
BUN: 11 mg/dL (ref 6–23)
Potassium: 3.7 mEq/L (ref 3.5–5.3)
Sodium: 137 mEq/L (ref 135–145)
Total Bilirubin: 0.6 mg/dL (ref 0.3–1.2)

## 2011-07-20 LAB — BRAIN NATRIURETIC PEPTIDE: Brain Natriuretic Peptide: 149.3 pg/mL — ABNORMAL HIGH (ref 0.0–100.0)

## 2011-09-06 HISTORY — PX: NM MYOVIEW LTD: HXRAD82

## 2011-10-11 ENCOUNTER — Encounter: Payer: Self-pay | Admitting: Cardiology

## 2012-02-21 ENCOUNTER — Encounter: Payer: Self-pay | Admitting: Family Medicine

## 2012-02-21 ENCOUNTER — Ambulatory Visit (INDEPENDENT_AMBULATORY_CARE_PROVIDER_SITE_OTHER): Payer: Medicare Other | Admitting: Family Medicine

## 2012-02-21 VITALS — BP 120/80 | HR 83 | Wt 228.0 lb

## 2012-02-21 DIAGNOSIS — R5381 Other malaise: Secondary | ICD-10-CM

## 2012-02-21 DIAGNOSIS — R5383 Other fatigue: Secondary | ICD-10-CM

## 2012-02-21 DIAGNOSIS — I4891 Unspecified atrial fibrillation: Secondary | ICD-10-CM

## 2012-02-21 DIAGNOSIS — E039 Hypothyroidism, unspecified: Secondary | ICD-10-CM

## 2012-02-21 DIAGNOSIS — Z79899 Other long term (current) drug therapy: Secondary | ICD-10-CM

## 2012-02-21 DIAGNOSIS — I10 Essential (primary) hypertension: Secondary | ICD-10-CM

## 2012-02-21 LAB — CBC WITH DIFFERENTIAL/PLATELET
Eosinophils Absolute: 0.1 10*3/uL (ref 0.0–0.7)
Eosinophils Relative: 2 % (ref 0–5)
HCT: 39.2 % (ref 36.0–46.0)
Hemoglobin: 13.3 g/dL (ref 12.0–15.0)
Lymphocytes Relative: 30 % (ref 12–46)
Lymphs Abs: 2.1 10*3/uL (ref 0.7–4.0)
MCH: 30.4 pg (ref 26.0–34.0)
MCV: 89.5 fL (ref 78.0–100.0)
Monocytes Relative: 9 % (ref 3–12)
Platelets: 362 10*3/uL (ref 150–400)
RBC: 4.38 MIL/uL (ref 3.87–5.11)
WBC: 7.1 10*3/uL (ref 4.0–10.5)

## 2012-02-21 LAB — COMPREHENSIVE METABOLIC PANEL
ALT: 12 U/L (ref 0–35)
CO2: 27 mEq/L (ref 19–32)
Calcium: 9.7 mg/dL (ref 8.4–10.5)
Chloride: 103 mEq/L (ref 96–112)
Creat: 0.98 mg/dL (ref 0.50–1.10)
Glucose, Bld: 83 mg/dL (ref 70–99)
Sodium: 137 mEq/L (ref 135–145)
Total Bilirubin: 0.5 mg/dL (ref 0.3–1.2)
Total Protein: 7.5 g/dL (ref 6.0–8.3)

## 2012-02-21 LAB — TSH: TSH: 0.956 u[IU]/mL (ref 0.350–4.500)

## 2012-02-21 NOTE — Progress Notes (Signed)
  Subjective:    Patient ID: [name], female    DOB: [birth year], 71 y.o.   MRN: [id number]  HPI As a several month history of feeling fatigued and irritability. Does have reflux disease Pepcid does help . He has underlying COPD and does have some shortness of breath but no chest pain, fever, chills, trouble with bowels or bladder. She does think her hair falling out. She continues on medications listed in her chart including thyroid medication. He also complains of a metallic taste in her mouth. She is under no undue stress. She usually sees her cardiologist in October. She did get her flu shot this year  Review of Systems     Objective:   Physical Exam alert and in no distress. Tympanic membranes and canals are normal. Throat is clear. Tonsils are normal. Neck is supple without adenopathy or thyromegaly. Cardiac exam shows an irregular sinus rhythm without murmurs or gallops. Lungs are clear to auscultation. DTR' 2+        Assessment & Plan:   1. HYPOTHYROIDISM   2. HYPERTENSION   3. Atrial fibrillation   4. Fatigue   5. Encounter for long-term (current) use of other medications

## 2012-02-22 ENCOUNTER — Other Ambulatory Visit: Payer: Self-pay

## 2012-02-22 MED ORDER — LEVOTHYROXINE SODIUM 125 MCG PO TABS: 125.0000 ug | ORAL_TABLET | Freq: Every day | ORAL | Status: DC

## 2012-02-22 NOTE — Telephone Encounter (Signed)
PT INFORMED OF LABS AND REQUEST WRITTEN RX FOR LEVOTHYROXINE

## 2012-02-23 ENCOUNTER — Other Ambulatory Visit (INDEPENDENT_AMBULATORY_CARE_PROVIDER_SITE_OTHER): Payer: Medicare Other

## 2012-02-23 DIAGNOSIS — R5383 Other fatigue: Secondary | ICD-10-CM

## 2012-02-23 LAB — POCT URINALYSIS DIPSTICK
Leukocytes, UA: NEGATIVE
Nitrite, UA: NEGATIVE
Urobilinogen, UA: NEGATIVE
pH, UA: 5

## 2012-02-24 ENCOUNTER — Telehealth: Payer: Self-pay | Admitting: Family Medicine

## 2012-02-24 ENCOUNTER — Encounter: Payer: Self-pay | Admitting: Internal Medicine

## 2012-02-24 DIAGNOSIS — G459 Transient cerebral ischemic attack, unspecified: Secondary | ICD-10-CM

## 2012-02-24 DIAGNOSIS — I639 Cerebral infarction, unspecified: Secondary | ICD-10-CM

## 2012-02-29 ENCOUNTER — Other Ambulatory Visit: Payer: Self-pay | Admitting: Family Medicine

## 2012-06-30 ENCOUNTER — Telehealth: Payer: Self-pay | Admitting: Internal Medicine

## 2012-06-30 MED ORDER — LEVOTHYROXINE SODIUM 125 MCG PO TABS: 125.0000 ug | ORAL_TABLET | Freq: Every day | ORAL | Status: DC

## 2012-06-30 NOTE — Telephone Encounter (Signed)
PRINTED AND WAITING ON JCL TO SIGN

## 2012-07-22 ENCOUNTER — Other Ambulatory Visit: Payer: Self-pay

## 2012-08-04 NOTE — Telephone Encounter (Signed)
TSD  

## 2012-08-11 ENCOUNTER — Encounter (HOSPITAL_COMMUNITY): Payer: Self-pay | Admitting: Pharmacy Technician

## 2012-08-12 ENCOUNTER — Other Ambulatory Visit: Payer: Self-pay | Admitting: Cardiology

## 2012-08-16 ENCOUNTER — Encounter (HOSPITAL_COMMUNITY)
Admission: RE | Disposition: A | Discharge status: Home / Self Care | Payer: Self-pay | Source: Ambulatory Visit | Attending: Cardiology

## 2012-08-16 ENCOUNTER — Encounter (HOSPITAL_COMMUNITY): Payer: Self-pay | Admitting: Anesthesiology

## 2012-08-16 ENCOUNTER — Ambulatory Visit (HOSPITAL_COMMUNITY)
Admission: RE | Admit: 2012-08-16 | Discharge: 2012-08-16 | Disposition: A | Discharge status: Home / Self Care | Payer: Medicare Other | Source: Ambulatory Visit | Attending: Cardiology | Admitting: Cardiology

## 2012-08-16 ENCOUNTER — Ambulatory Visit (HOSPITAL_COMMUNITY): Payer: Medicare Other | Admitting: Anesthesiology

## 2012-08-16 DIAGNOSIS — E039 Hypothyroidism, unspecified: Secondary | ICD-10-CM | POA: Insufficient documentation

## 2012-08-16 DIAGNOSIS — J449 Chronic obstructive pulmonary disease, unspecified: Secondary | ICD-10-CM | POA: Insufficient documentation

## 2012-08-16 DIAGNOSIS — R0602 Shortness of breath: Secondary | ICD-10-CM | POA: Diagnosis present

## 2012-08-16 DIAGNOSIS — J4489 Other specified chronic obstructive pulmonary disease: Secondary | ICD-10-CM | POA: Insufficient documentation

## 2012-08-16 DIAGNOSIS — I10 Essential (primary) hypertension: Secondary | ICD-10-CM | POA: Diagnosis present

## 2012-08-16 DIAGNOSIS — I4891 Unspecified atrial fibrillation: Secondary | ICD-10-CM | POA: Insufficient documentation

## 2012-08-16 DIAGNOSIS — I480 Paroxysmal atrial fibrillation: Secondary | ICD-10-CM

## 2012-08-16 HISTORY — PX: CARDIOVERSION: SHX1299

## 2012-08-16 LAB — CBC WITH DIFFERENTIAL/PLATELET
Basophils Absolute: 0 10*3/uL (ref 0.0–0.1)
Lymphocytes Relative: 26 % (ref 12–46)
Neutro Abs: 4.2 10*3/uL (ref 1.7–7.7)
Platelets: 274 10*3/uL (ref 150–400)
RDW: 13.5 % (ref 11.5–15.5)
WBC: 6.5 10*3/uL (ref 4.0–10.5)

## 2012-08-16 LAB — BASIC METABOLIC PANEL
Calcium: 9.3 mg/dL (ref 8.4–10.5)
Chloride: 101 mEq/L (ref 96–112)
Creatinine, Ser: 0.94 mg/dL (ref 0.50–1.10)
GFR calc Af Amer: 69 mL/min — ABNORMAL LOW (ref >90)

## 2012-08-16 LAB — PROTIME-INR
INR: 2.8 — ABNORMAL HIGH (ref 0.00–1.49)
Prothrombin Time: 28.1 seconds — ABNORMAL HIGH (ref 11.6–15.2)

## 2012-08-16 LAB — MAGNESIUM: Magnesium: 2 mg/dL (ref 1.5–2.5)

## 2012-08-16 SURGERY — CARDIOVERSION
Anesthesia: Monitor Anesthesia Care | Wound class: Clean

## 2012-08-16 SURGERY — CARDIOVERSION
Anesthesia: Monitor Anesthesia Care

## 2012-08-16 MED ORDER — SODIUM CHLORIDE 0.9 % IV SOLN
INTRAVENOUS | Status: DC
  Administered 2012-08-16: 12:00:00 via INTRAVENOUS

## 2012-08-16 NOTE — Anesthesia Preprocedure Evaluation (Addendum)
Anesthesia Evaluation  Patient identified by MRN, date of birth, ID band Patient awake    Reviewed: Allergy & Precautions, H&P , NPO status , Patient's Chart, lab work & pertinent test results, reviewed documented beta blocker date and time   Airway Mallampati: I TM Distance: >3 FB Neck ROM: full    Dental   Pulmonary shortness of breath and lying, COPDformer smoker,          Cardiovascular hypertension, + dysrhythmias Atrial Fibrillation Rhythm:regular Rate:Normal     Neuro/Psych TIA   GI/Hepatic   Endo/Other  Hypothyroidism   Renal/GU      Musculoskeletal   Abdominal   Peds  Hematology   Anesthesia Other Findings   Reproductive/Obstetrics                          Anesthesia Physical Anesthesia Plan  ASA: III  Anesthesia Plan: General   Post-op Pain Management:    Induction: Intravenous  Airway Management Planned: Mask  Additional Equipment:   Intra-op Plan:   Post-operative Plan:   Informed Consent: I have reviewed the patients History and Physical, chart, labs and discussed the procedure including the risks, benefits and alternatives for the proposed anesthesia with the patient or authorized representative who has indicated his/her understanding and acceptance.     Plan Discussed with: CRNA, Anesthesiologist and Surgeon  Anesthesia Plan Comments:         Anesthesia Quick Evaluation

## 2012-08-16 NOTE — Transfer of Care (Signed)
Immediate Anesthesia Transfer of Care Note  Patient: [name]  Procedure(s) Performed: Procedure(s) with comments: CARDIOVERSION (N/A) - labs on arrival   Patient Location: PACU and Endoscopy Unit  Anesthesia Type:MAC  Level of Consciousness: awake, alert  and oriented  Airway & Oxygen Therapy: Patient Spontanous Breathing and Patient connected to nasal cannula oxygen  Post-op Assessment: Report given to PACU RN and Post -op Vital signs reviewed and stable  Post vital signs: Reviewed and stable  Complications: No apparent anesthesia complications

## 2012-08-16 NOTE — H&P (Signed)
History and Physical Interval Note:  NAME:  [name]   MRN: [id number] DOB:  1941-03-08   ADMIT DATE: 08/16/2012   08/16/2012 12:00 PM  [name] is a 72 y.o. female with PAF, who I saw last week @ [facility] for DOE & recurrent Afib.  She is symptomatic with Afib & is referred for Synchronized DCCV.  She has remained therapeutic on warfarin.   Past Medical History  Diagnosis Date  . Atrial fib/flutter, transient   . COPD (chronic obstructive pulmonary disease)   . TIA (transient ischemic attack)   . Thyroid disease     HYPOTHYROID   Past Surgical History  Procedure Laterality Date  . Joint replacement  12/08/2006    LEFT HIP   . Total hip arthroplasty  2008,2012    SOCHx:  reports that she quit smoking about 8 years ago. She has never used smokeless tobacco. Her alcohol and drug histories are not on file.  She is a single mother of 1, grandmother of 1. She is here with her partner. She is not active, does not get exercise, just walks her dogs around the house and gets short of breath doing that. She does not smoke, does not drink.  ALLERGIES: No Known Allergies  HOME MEDICATIONS: Prescriptions prior to admission  Medication Sig Dispense Refill  . aspirin 81 MG tablet Take 81 mg by mouth daily.        Marland Kitchen diltiazem (CARDIZEM CD) 180 MG 24 hr capsule Take 180 mg by mouth 2 (two) times daily.        . fish oil-omega-3 fatty acids 1000 MG capsule Take 2 g by mouth daily.      . flecainide (TAMBOCOR) 100 MG tablet Take 100 mg by mouth 2 (two) times daily.        . furosemide (LASIX) 40 MG tablet Take 40 mg by mouth daily.        Marland Kitchen ibuprofen (ADVIL,MOTRIN) 200 MG tablet Take 400 mg by mouth every 6 (six) hours as needed for pain.      Marland Kitchen levothyroxine (SYNTHROID, LEVOTHROID) 125 MCG tablet Take 125 mcg by mouth daily.      Marland Kitchen warfarin (COUMADIN) 2 MG tablet Take 2-3 mg by mouth daily. Take 1.5 tablets on Sunday, Monday, Tuesday, Wednesday, Friday, and Saturday. Takes 1 tablet on  thursday        PHYSICAL EXAM:There were no vitals taken for this visit. General: She is a pleasant, albeit somewhat abrasive woman who is 72 years old and somewhat opinionated in nature but is in no acute distress. She is alert and oriented x3, answers questions appropriately. She is obese with a BMI of 39, 5 feet, 4 inches, 228 pounds, but in no acute distress. She answers questions appropriately. Blood pressure is 130/90 with a heart rate of 71 beats per minute. HEENT: NCAT, EOMI, MMM. Anicteric sclerae. Neck: Supple, no LAN, JVD or carotid bruit. Lungs: CTAB, nonlabored, normal effort, good air movement. Heart: Irregularly irregular with a normal S1, S2 with a 1/6 to 2/6 holosystolic murmur heard at the lower sternal border, but it is certainly not very loud. Abdomen: Obese but otherwise soft/NT/ND/NABS. No HSM. Extremities: No C/C/E with trace edema. No change from Clinic Exam.  IMPRESSION & PLAN The patients' history has been reviewed, patient examined, no change in status from most recent note, stable for surgery. I have reviewed the patients' chart and labs. Questions were answered to the patient's satisfaction.    [name]  Pau has presented today for DCCV, with the diagnosis of symptomatic Afib. The various methods of treatment have been discussed with the patient and family.   Risks / Complications include, but not limited to: Death, MI, CVA/TIA, VF/VT (with defibrillation), Bradycardia (need for temporary pacer placement), bruising, adverse medication reactions.    After consideration of risks, benefits and other options for treatment, the patient has consented to Procedure(s):  SYNCHRONIZED DIRECT CURRENT CARDIOVERSION.   We will proceed with the planned procedure.   HARDING,DAVID W [facility] [address] [place], [place]  [address]  [phone]  08/16/2012 12:00 PM

## 2012-08-16 NOTE — CV Procedure (Signed)
[facility]  CARDIOVERSION NOTE   Procedure: Electrical Cardioversion Indications:  Atrial Fibrillation  Procedure Details:  Consent: Risks of procedure as well as the alternatives and risks of each were explained to the (patient/caregiver).  Consent for procedure obtained.  Time Out: Verified patient identification, verified procedure, site/side was marked, verified correct patient position, special equipment/implants available, medications/allergies/relevent history reviewed, required imaging and test results available.  Performed  Patient placed on cardiac monitor, pulse oximetry, supplemental oxygen as necessary.  Sedation given: 30 mg Lidocaine, 70 mg Propofol Pacer pads placed anterior and posterior chest.  Cardioverted 1 time(s).  Cardioverted at 200J.  Impression: Findings: Post procedure EKG shows: NSR Complications: None Patient did not tolerate procedure well.  She will f/u with me as scheduled on 3/27.  Time Spent Directly with the Patient:       HARDING,DAVID W 08/16/2012, 1:45 PM Attending Cardiologist [facility]

## 2012-08-16 NOTE — Anesthesia Postprocedure Evaluation (Signed)
  Anesthesia Post-op Note  Patient: [name]  Procedure(s) Performed: Procedure(s) with comments: CARDIOVERSION (N/A) - labs on arrival   Patient Location: PACU  Anesthesia Type:General  Level of Consciousness: awake, alert , oriented and patient cooperative  Airway and Oxygen Therapy: Patient Spontanous Breathing  Post-op Pain: none  Post-op Assessment: Post-op Vital signs reviewed, Patient's Cardiovascular Status Stable, Respiratory Function Stable, Patent Airway, No signs of Nausea or vomiting and Pain level controlled  Post-op Vital Signs: stable  Complications: No apparent anesthesia complications

## 2012-08-17 ENCOUNTER — Encounter (HOSPITAL_COMMUNITY): Payer: Self-pay | Admitting: Cardiology

## 2012-08-28 ENCOUNTER — Ambulatory Visit: Payer: Self-pay | Admitting: Internal Medicine

## 2012-08-28 DIAGNOSIS — I480 Paroxysmal atrial fibrillation: Secondary | ICD-10-CM

## 2012-08-28 DIAGNOSIS — Z7901 Long term (current) use of anticoagulants: Secondary | ICD-10-CM | POA: Insufficient documentation

## 2012-09-05 ENCOUNTER — Other Ambulatory Visit: Payer: Self-pay | Admitting: *Deleted

## 2012-09-05 ENCOUNTER — Other Ambulatory Visit (HOSPITAL_COMMUNITY): Payer: Self-pay | Admitting: Cardiology

## 2012-09-05 DIAGNOSIS — I4891 Unspecified atrial fibrillation: Secondary | ICD-10-CM

## 2012-09-05 HISTORY — PX: TRANSTHORACIC ECHOCARDIOGRAM: SHX275

## 2012-09-06 ENCOUNTER — Ambulatory Visit (HOSPITAL_COMMUNITY)
Admission: RE | Admit: 2012-09-06 | Discharge: 2012-09-06 | Disposition: A | Discharge status: Home / Self Care | Payer: Medicare Other | Source: Ambulatory Visit | Attending: Cardiology | Admitting: Cardiology

## 2012-09-06 DIAGNOSIS — I4891 Unspecified atrial fibrillation: Secondary | ICD-10-CM | POA: Insufficient documentation

## 2012-09-06 DIAGNOSIS — I059 Rheumatic mitral valve disease, unspecified: Secondary | ICD-10-CM | POA: Insufficient documentation

## 2012-09-06 DIAGNOSIS — J4489 Other specified chronic obstructive pulmonary disease: Secondary | ICD-10-CM | POA: Insufficient documentation

## 2012-09-06 DIAGNOSIS — E039 Hypothyroidism, unspecified: Secondary | ICD-10-CM | POA: Insufficient documentation

## 2012-09-06 DIAGNOSIS — J449 Chronic obstructive pulmonary disease, unspecified: Secondary | ICD-10-CM | POA: Insufficient documentation

## 2012-09-06 DIAGNOSIS — I079 Rheumatic tricuspid valve disease, unspecified: Secondary | ICD-10-CM | POA: Insufficient documentation

## 2012-09-06 NOTE — Progress Notes (Signed)
2D Echo Performed 09/06/2012    Arijana Narayan, RCS  

## 2012-09-14 ENCOUNTER — Encounter (HOSPITAL_COMMUNITY)
Admission: RE | Disposition: A | Discharge status: Home / Self Care | Payer: Self-pay | Source: Ambulatory Visit | Attending: Cardiology

## 2012-09-14 ENCOUNTER — Ambulatory Visit (HOSPITAL_COMMUNITY): Payer: Medicare Other | Admitting: Certified Registered Nurse Anesthetist

## 2012-09-14 ENCOUNTER — Ambulatory Visit (HOSPITAL_COMMUNITY): Admit: 2012-09-14 | Payer: Self-pay | Admitting: Internal Medicine

## 2012-09-14 ENCOUNTER — Ambulatory Visit (HOSPITAL_COMMUNITY)
Admission: RE | Admit: 2012-09-14 | Discharge: 2012-09-14 | Disposition: A | Discharge status: Home / Self Care | Payer: Medicare Other | Source: Ambulatory Visit | Attending: Internal Medicine | Admitting: Internal Medicine

## 2012-09-14 ENCOUNTER — Encounter (HOSPITAL_COMMUNITY): Payer: Self-pay

## 2012-09-14 ENCOUNTER — Encounter (HOSPITAL_COMMUNITY): Payer: Self-pay | Admitting: Certified Registered Nurse Anesthetist

## 2012-09-14 DIAGNOSIS — R5381 Other malaise: Secondary | ICD-10-CM | POA: Insufficient documentation

## 2012-09-14 DIAGNOSIS — J4489 Other specified chronic obstructive pulmonary disease: Secondary | ICD-10-CM | POA: Insufficient documentation

## 2012-09-14 DIAGNOSIS — J449 Chronic obstructive pulmonary disease, unspecified: Secondary | ICD-10-CM | POA: Insufficient documentation

## 2012-09-14 DIAGNOSIS — R5383 Other fatigue: Secondary | ICD-10-CM | POA: Insufficient documentation

## 2012-09-14 DIAGNOSIS — Z7901 Long term (current) use of anticoagulants: Secondary | ICD-10-CM | POA: Insufficient documentation

## 2012-09-14 DIAGNOSIS — I4891 Unspecified atrial fibrillation: Secondary | ICD-10-CM | POA: Insufficient documentation

## 2012-09-14 DIAGNOSIS — Z7982 Long term (current) use of aspirin: Secondary | ICD-10-CM | POA: Insufficient documentation

## 2012-09-14 DIAGNOSIS — E669 Obesity, unspecified: Secondary | ICD-10-CM | POA: Insufficient documentation

## 2012-09-14 DIAGNOSIS — Z79899 Other long term (current) drug therapy: Secondary | ICD-10-CM | POA: Insufficient documentation

## 2012-09-14 DIAGNOSIS — Z6839 Body mass index (BMI) 39.0-39.9, adult: Secondary | ICD-10-CM | POA: Insufficient documentation

## 2012-09-14 DIAGNOSIS — E039 Hypothyroidism, unspecified: Secondary | ICD-10-CM | POA: Insufficient documentation

## 2012-09-14 HISTORY — PX: CARDIOVERSION: SHX1299

## 2012-09-14 LAB — CBC WITH DIFFERENTIAL/PLATELET
Basophils Absolute: 0 10*3/uL (ref 0.0–0.1)
Basophils Relative: 0 % (ref 0–1)
HCT: 38.9 % (ref 36.0–46.0)
Lymphocytes Relative: 28 % (ref 12–46)
MCHC: 34.4 g/dL (ref 30.0–36.0)
Monocytes Absolute: 0.6 10*3/uL (ref 0.1–1.0)
Neutro Abs: 4.1 10*3/uL (ref 1.7–7.7)
Neutrophils Relative %: 61 % (ref 43–77)
RDW: 13.3 % (ref 11.5–15.5)
WBC: 6.7 10*3/uL (ref 4.0–10.5)

## 2012-09-14 LAB — COMPREHENSIVE METABOLIC PANEL
ALT: 12 U/L (ref 0–35)
AST: 21 U/L (ref 0–37)
Albumin: 3.9 g/dL (ref 3.5–5.2)
Alkaline Phosphatase: 83 U/L (ref 39–117)
CO2: 26 mEq/L (ref 19–32)
Chloride: 103 mEq/L (ref 96–112)
GFR calc non Af Amer: 44 mL/min — ABNORMAL LOW (ref >90)
Potassium: 4 mEq/L (ref 3.5–5.1)
Sodium: 139 mEq/L (ref 135–145)
Total Bilirubin: 0.5 mg/dL (ref 0.3–1.2)

## 2012-09-14 LAB — APTT: aPTT: 54 seconds — ABNORMAL HIGH (ref 24–37)

## 2012-09-14 LAB — PROTIME-INR: INR: 3.13 — ABNORMAL HIGH (ref 0.00–1.49)

## 2012-09-14 SURGERY — CARDIOVERSION
Anesthesia: Monitor Anesthesia Care

## 2012-09-14 SURGERY — CARDIOVERSION
Anesthesia: Monitor Anesthesia Care | Wound class: Clean

## 2012-09-14 MED ORDER — LIDOCAINE HCL (CARDIAC) 20 MG/ML IV SOLN
INTRAVENOUS | Status: DC | PRN
  Administered 2012-09-14: 40 mg via INTRAVENOUS

## 2012-09-14 MED ORDER — PROPOFOL 10 MG/ML IV BOLUS
INTRAVENOUS | Status: DC | PRN
  Administered 2012-09-14: 50 mg via INTRAVENOUS

## 2012-09-14 MED ORDER — SODIUM CHLORIDE 0.9 % IV SOLN: INTRAVENOUS | Status: DC

## 2012-09-14 NOTE — H&P (Signed)
History and Physical Interval Note:  NAME:  [name]   MRN: [id number] DOB:  1940/09/09   ADMIT DATE: (Not on file)   09/14/2012 8:44 AM  [name] is a 72 y.o. female female with PAF, who I saw last week @ [facility] for DOE & recurrent Afib. She is symptomatic with Afib & recently underwent Synchronized DCCV, but has reverted back into Afib.  I changed her antiarrythmic to Multaq & have planned a reattempt at DCCV. She has remained therapeutic on warfarin.  Past Medical History  Diagnosis Date  . Atrial fib/flutter, transient   . COPD (chronic obstructive pulmonary disease)   . TIA (transient ischemic attack)   . Thyroid disease     HYPOTHYROID   Past Surgical History  Procedure Laterality Date  . Joint replacement  12/08/2006    LEFT HIP   . Total hip arthroplasty  4098,1191  . Tonsillectomy    . Cardioversion N/A 08/16/2012    Procedure: CARDIOVERSION;  Surgeon: Marykay Lex, MD;  Location: [facility] ENDOSCOPY;  Service: Endoscopy;  Laterality: N/A;  labs on arrival     FAMHx: No family history on file.  SOCHx:  reports that she quit smoking about 8 years ago. She has never used smokeless tobacco. She reports that  drinks alcohol. Her drug history is not on file.  She is here with her partner. She is not  active, does not get exercise, just walks her dogs around the house and gets short of breath doing that. She  does not smoke, does not drink.  ALLERGIES: No Known Allergies  HOME MEDICATIONS: No prescriptions prior to admission    PHYSICAL EXAM:There were no vitals taken for this visit. No change from Clinic Exam.  IMPRESSION & PLAN The patients' history has been reviewed, patient examined, no change in status from most recent note, stable for surgery. I have reviewed the patients' chart and labs. Questions were answered to the patient's satisfaction.    [name] has presented today for Cardioversion, with the diagnosis of Recurrent Persistent Atrial Fibrillation.  The various methods of treatment have been discussed with the patient and family.   Risks / Complications include, but not limited to: Death, MI, CVA/TIA, VF/VT (with defibrillation), Bradycardia (need for temporary pacer placement), adverse medication reactions.     After consideration of risks, benefits and other options for treatment, the patient has consented to Procedure(s):   Direct Current Cardioversion  as an intervention.   We will proceed with the planned procedure.   HARDING,DAVID W [facility] [address] [place], [place]  [address]  [phone]  09/14/2012 8:44 AM

## 2012-09-14 NOTE — Transfer of Care (Signed)
Immediate Anesthesia Transfer of Care Note  Patient: [name]  Procedure(s) Performed: Procedure(s): CARDIOVERSION (N/A)  Patient Location: Endoscopy Unit  Anesthesia Type:General  Level of Consciousness: awake, alert  and oriented  Airway & Oxygen Therapy: Patient Spontanous Breathing and Patient connected to nasal cannula oxygen  Post-op Assessment: Report given to PACU RN, Post -op Vital signs reviewed and stable and Patient moving all extremities  Post vital signs: Reviewed and stable  Complications: No apparent anesthesia complications

## 2012-09-14 NOTE — Progress Notes (Signed)
Patient refusing to stay in recovery ,awake and alert.

## 2012-09-14 NOTE — CV Procedure (Signed)
[facility]  CARDIOVERSION NOTE   Procedure: Electrical Cardioversion Indications:  Atrial Fibrillation  Procedure Details:  Consent: Risks of procedure as well as the alternatives and risks of each were explained to the (patient/caregiver).  Consent for procedure obtained.  Time Out: Verified patient identification, verified procedure, site/side was marked, verified correct patient position, special equipment/implants available, medications/allergies/relevent history reviewed, required imaging and test results available.  Performed  Patient placed on cardiac monitor, pulse oximetry, supplemental oxygen as necessary.  Sedation given: Propofol per anesthesia Pacer pads placed anterior and posterior chest.  Cardioverted 1 time(s).  Cardioverted at 150Jbiphasic.  Impression: Findings: Post procedure EKG shows: NSR Complications: None Patient did tolerate procedure well.  Time Spent Directly with the Patient:  30 minutes   Chrystie Nose, MD, [facility] Attending Cardiologist [facility]  Bethann Qualley C 09/14/2012, 1:33 PM

## 2012-09-14 NOTE — Anesthesia Postprocedure Evaluation (Signed)
  Anesthesia Post-op Note  Patient: [name]  Procedure(s) Performed: Procedure(s): CARDIOVERSION (N/A)  Patient Location: Endoscopy Unit  Anesthesia Type:General  Level of Consciousness: awake, alert  and oriented  Airway and Oxygen Therapy: Patient Spontanous Breathing and Patient connected to nasal cannula oxygen  Post-op Pain: none  Post-op Assessment: Post-op Vital signs reviewed, Patient's Cardiovascular Status Stable, Respiratory Function Stable, Patent Airway, No signs of Nausea or vomiting and Pain level controlled  Post-op Vital Signs: Reviewed and stable  Complications: No apparent anesthesia complications

## 2012-09-14 NOTE — H&P (Addendum)
     [facility]          INTERVAL PROCEDURE H&P   History and Physical Interval Note:  09/14/2012 1:29 PM  Dr. Herbie Baltimore had an emergent cath lab case. I spoke with [name], who I previously cared for and she was agreeable to having me perform cardioversion. She will follow-up with Dr. Herbie Baltimore in the office.  [name] has presented today for their planned procedure. The various methods of treatment have been discussed with the patient and family. After consideration of risks, benefits and other options for treatment, the patient has consented to the procedure.  The patients' outpatient history has been reviewed, patient examined, and no change in status from most recent office note within the past 30 days. I have reviewed the patients' chart and labs and will proceed as planned. Questions were answered to the patient's satisfaction.   Erika Nose, MD, [facility] Attending Cardiologist [facility]  [name] 09/14/2012, 1:29 PM

## 2012-09-14 NOTE — Anesthesia Preprocedure Evaluation (Signed)
Anesthesia Evaluation  Patient identified by MRN, date of birth, ID band Patient awake    Reviewed: Allergy & Precautions, H&P , NPO status , Patient's Chart, lab work & pertinent test results  Airway Mallampati: I TM Distance: >3 FB Neck ROM: full    Dental   Pulmonary COPD         Cardiovascular hypertension, + dysrhythmias Atrial Fibrillation Rhythm:irregular Rate:Normal     Neuro/Psych    GI/Hepatic   Endo/Other  Hypothyroidism   Renal/GU      Musculoskeletal   Abdominal   Peds  Hematology   Anesthesia Other Findings   Reproductive/Obstetrics                           Anesthesia Physical Anesthesia Plan  ASA: III  Anesthesia Plan: General   Post-op Pain Management:    Induction: Intravenous  Airway Management Planned: Mask  Additional Equipment:   Intra-op Plan:   Post-operative Plan:   Informed Consent: I have reviewed the patients History and Physical, chart, labs and discussed the procedure including the risks, benefits and alternatives for the proposed anesthesia with the patient or authorized representative who has indicated his/her understanding and acceptance.     Plan Discussed with: CRNA, Anesthesiologist and Surgeon  Anesthesia Plan Comments:         Anesthesia Quick Evaluation

## 2012-09-18 ENCOUNTER — Encounter (HOSPITAL_COMMUNITY): Payer: Self-pay | Admitting: Internal Medicine

## 2012-09-20 ENCOUNTER — Telehealth: Payer: Self-pay | Admitting: Family Medicine

## 2012-09-20 NOTE — Telephone Encounter (Signed)
[facility] req Ventolin HFA refill

## 2012-09-20 NOTE — Telephone Encounter (Signed)
CALLED AND LEFT MESSAGE FOR PT THAT WE GOT A REQUEST FOR VENTOLIN FROM [facility] AND I DO NOT HAVE THIS IN HER MED LIST TO PLEASE CALL ME BACK

## 2012-09-27 LAB — PROTIME-INR

## 2012-10-04 ENCOUNTER — Telehealth: Payer: Self-pay | Admitting: Family Medicine

## 2012-10-05 NOTE — Telephone Encounter (Signed)
CALLED PT HOME # LEFT MESSAGE WORD FOR WORD

## 2012-10-05 NOTE — Telephone Encounter (Signed)
The main issue here is whether she has underlying allergies. If there is any questions she might need to be tested and can have this done through her lung specialist

## 2012-10-13 ENCOUNTER — Ambulatory Visit (INDEPENDENT_AMBULATORY_CARE_PROVIDER_SITE_OTHER): Payer: Medicare Other | Admitting: Medical

## 2012-10-13 ENCOUNTER — Encounter: Payer: Self-pay | Admitting: Medical

## 2012-10-13 VITALS — BP 110/80 | HR 88 | Temp 97.5°F | Resp 18 | Wt 230.0 lb

## 2012-10-13 DIAGNOSIS — R05 Cough: Secondary | ICD-10-CM

## 2012-10-13 DIAGNOSIS — J029 Acute pharyngitis, unspecified: Secondary | ICD-10-CM

## 2012-10-13 DIAGNOSIS — R0982 Postnasal drip: Secondary | ICD-10-CM

## 2012-10-13 MED ORDER — LEVALBUTEROL TARTRATE 45 MCG/ACT IN AERO: 1.0000 | INHALATION_SPRAY | Freq: Four times a day (QID) | RESPIRATORY_TRACT | Status: DC | PRN

## 2012-10-13 MED ORDER — FLUTICASONE PROPIONATE 50 MCG/ACT NA SUSP: 2.0000 | Freq: Every day | NASAL | Status: DC

## 2012-10-13 MED ORDER — BENZONATATE 100 MG PO CAPS: 100.0000 mg | ORAL_CAPSULE | Freq: Three times a day (TID) | ORAL | Status: DC | PRN

## 2012-10-13 NOTE — Patient Instructions (Signed)
Your symptoms suggest pollen and dander causing the problems  Begin Fluticasone nasal spray, 2 sprays per nostril twice daily for a week, then once daily for the next several weeks  Use nasal saline rinse and salt water gargles to clear out pollen and dander  Use warm fluids to sooth the throat  You can use Tessalon perles for cough up to 3 times daily  If not improving in a week, let me know  If needed you can use Xopenex inhaler

## 2012-10-13 NOTE — Progress Notes (Signed)
Subjective: She reports 2 wk hx/o burning throat, ears bothering her, headache in the morning, some nausea, dizzy, very little sneezing, some itching.  Has some SOB, wheezing.  No fever, no vomiting, diarrhea, rash, sinus pressure.   She does have 4 dogs and 5 cats at home and the pollen has been aggravating her.   Using some nasal saline and OTC decongestant.   Had cardioversion back in march 2 x, unsuccessful, so on medication therapy for now.  Her cardiologist told her that if she has wheezing, she can use Xopenex, would like an inhaler just in case given the occasional SOB.  Past Medical History  Diagnosis Date  . Atrial fib/flutter, transient   . COPD (chronic obstructive pulmonary disease)   . TIA (transient ischemic attack)   . Thyroid disease     HYPOTHYROID   ROS as in subjective  Objective: Filed Vitals:   10/13/12 1058  BP: 110/80  Pulse: 88  Temp: 97.5 F (36.4 C)  Resp: 18    General appearance: alert, no distress, WD/WN HEENT: normocephalic, sclerae anicteric, TMs with serous effusion, nares patent, no discharge or erythema, pharynx normal Oral cavity: MMM, no lesions Neck: supple, no lymphadenopathy, no thyromegaly, no masses Heart: RRR, normal S1, S2, no murmurs Lungs: CTA bilaterally, no wheezes, rhonchi, or rales Pulses: 2+ symmetric, upper and lower extremities, normal cap refill No edema   Assessment: Encounter Diagnoses  Name Primary?  . Sore throat Yes  . Post-nasal drip   . Cough      Plan: Patient Instructions  Your symptoms suggest pollen and dander causing the problems  Begin Fluticasone nasal spray, 2 sprays per nostril twice daily for a week, then once daily for the next several weeks  Use nasal saline rinse and salt water gargles to clear out pollen and dander  Use warm fluids to sooth the throat  You can use Tessalon perles for cough up to 3 times daily  If not improving in a week, let me know  If needed you can use Xopenex  inhaler

## 2012-11-02 ENCOUNTER — Other Ambulatory Visit: Payer: Self-pay | Admitting: Cardiovascular Disease

## 2012-11-03 ENCOUNTER — Ambulatory Visit (INDEPENDENT_AMBULATORY_CARE_PROVIDER_SITE_OTHER): Payer: Self-pay | Admitting: Pharmacist Clinician (PhC)/ Clinical Pharmacy Specialist

## 2012-11-03 DIAGNOSIS — I480 Paroxysmal atrial fibrillation: Secondary | ICD-10-CM

## 2012-11-03 DIAGNOSIS — Z7901 Long term (current) use of anticoagulants: Secondary | ICD-10-CM

## 2012-11-03 DIAGNOSIS — I4891 Unspecified atrial fibrillation: Secondary | ICD-10-CM

## 2012-12-11 ENCOUNTER — Other Ambulatory Visit: Payer: Self-pay | Admitting: Cardiovascular Disease

## 2012-12-11 LAB — PROTIME-INR
INR: 3.32 — ABNORMAL HIGH (ref <1.50)
Prothrombin Time: 32.1 seconds — ABNORMAL HIGH (ref 11.6–15.2)

## 2012-12-12 ENCOUNTER — Ambulatory Visit (INDEPENDENT_AMBULATORY_CARE_PROVIDER_SITE_OTHER): Payer: Self-pay | Admitting: Pharmacist Clinician (PhC)/ Clinical Pharmacy Specialist

## 2012-12-12 DIAGNOSIS — I480 Paroxysmal atrial fibrillation: Secondary | ICD-10-CM

## 2012-12-12 DIAGNOSIS — Z7901 Long term (current) use of anticoagulants: Secondary | ICD-10-CM

## 2012-12-12 DIAGNOSIS — I4891 Unspecified atrial fibrillation: Secondary | ICD-10-CM

## 2013-01-08 ENCOUNTER — Other Ambulatory Visit: Payer: Self-pay | Admitting: Cardiovascular Disease

## 2013-01-09 ENCOUNTER — Ambulatory Visit (INDEPENDENT_AMBULATORY_CARE_PROVIDER_SITE_OTHER): Payer: Self-pay | Admitting: Pharmacist

## 2013-01-09 DIAGNOSIS — I4891 Unspecified atrial fibrillation: Secondary | ICD-10-CM

## 2013-01-09 DIAGNOSIS — Z7901 Long term (current) use of anticoagulants: Secondary | ICD-10-CM

## 2013-01-09 DIAGNOSIS — I480 Paroxysmal atrial fibrillation: Secondary | ICD-10-CM

## 2013-01-09 LAB — PROTIME-INR
INR: 2.61 — ABNORMAL HIGH (ref <1.50)
Prothrombin Time: 26.9 seconds — ABNORMAL HIGH (ref 11.6–15.2)

## 2013-01-10 ENCOUNTER — Other Ambulatory Visit: Payer: Self-pay

## 2013-01-15 ENCOUNTER — Other Ambulatory Visit: Payer: Self-pay

## 2013-01-15 MED ORDER — LEVOTHYROXINE SODIUM 125 MCG PO TABS: 125.0000 ug | ORAL_TABLET | Freq: Every day | ORAL | Status: DC

## 2013-01-15 NOTE — Telephone Encounter (Signed)
SENT LEVOTHYROXINE IN TO [facility]

## 2013-01-19 ENCOUNTER — Telehealth: Payer: Self-pay | Admitting: Cardiology

## 2013-01-19 NOTE — Telephone Encounter (Signed)
Returned call.  Left message to call back on Monday between 8am and 4pm.

## 2013-01-19 NOTE — Telephone Encounter (Signed)
Returning Ambers call °

## 2013-01-19 NOTE — Telephone Encounter (Signed)
Returned call.  Left message to call back before 4pm or Monday between 8am and 4pm.

## 2013-01-19 NOTE — Telephone Encounter (Signed)
[facility] has changed one of her medication----wants to know if this is ok--wants to speak with Baxter Hire also

## 2013-01-22 ENCOUNTER — Telehealth: Payer: Self-pay | Admitting: Cardiology

## 2013-01-22 NOTE — Telephone Encounter (Signed)
Pt is returning your call from Friday.

## 2013-01-22 NOTE — Telephone Encounter (Signed)
Pt stated [facility] sent her a different medication.  Pt stated she received Cartia XT and usually gets the ER.  Discussed w/ Dr. Allyson Sabal to confirm that they are the same and informed they are.  Pt informed and also advised to contact her pharmacy for more information.  Pt verbalized understanding and agreed w/ plan.

## 2013-02-12 ENCOUNTER — Other Ambulatory Visit: Payer: Self-pay | Admitting: Cardiovascular Disease

## 2013-02-13 ENCOUNTER — Ambulatory Visit (INDEPENDENT_AMBULATORY_CARE_PROVIDER_SITE_OTHER): Payer: Medicare Other | Admitting: Pharmacist Clinician (PhC)/ Clinical Pharmacy Specialist

## 2013-02-13 DIAGNOSIS — I480 Paroxysmal atrial fibrillation: Secondary | ICD-10-CM

## 2013-02-13 DIAGNOSIS — I4891 Unspecified atrial fibrillation: Secondary | ICD-10-CM

## 2013-02-13 DIAGNOSIS — Z7901 Long term (current) use of anticoagulants: Secondary | ICD-10-CM

## 2013-02-13 LAB — PROTIME-INR
INR: 4.05 — ABNORMAL HIGH (ref <1.50)
Prothrombin Time: 37.2 seconds — ABNORMAL HIGH (ref 11.6–15.2)

## 2013-02-19 ENCOUNTER — Encounter: Payer: Self-pay | Admitting: Cardiology

## 2013-02-26 ENCOUNTER — Ambulatory Visit (INDEPENDENT_AMBULATORY_CARE_PROVIDER_SITE_OTHER): Payer: Medicare Other | Admitting: Cardiology

## 2013-02-26 ENCOUNTER — Encounter: Payer: Self-pay | Admitting: Cardiology

## 2013-02-26 VITALS — BP 116/78 | HR 89 | Ht 63.0 in | Wt 229.8 lb

## 2013-02-26 DIAGNOSIS — Z7901 Long term (current) use of anticoagulants: Secondary | ICD-10-CM

## 2013-02-26 DIAGNOSIS — J302 Other seasonal allergic rhinitis: Secondary | ICD-10-CM | POA: Insufficient documentation

## 2013-02-26 DIAGNOSIS — I480 Paroxysmal atrial fibrillation: Secondary | ICD-10-CM

## 2013-02-26 DIAGNOSIS — I4891 Unspecified atrial fibrillation: Secondary | ICD-10-CM

## 2013-02-26 DIAGNOSIS — E669 Obesity, unspecified: Secondary | ICD-10-CM | POA: Insufficient documentation

## 2013-02-26 DIAGNOSIS — I10 Essential (primary) hypertension: Secondary | ICD-10-CM

## 2013-02-26 DIAGNOSIS — J309 Allergic rhinitis, unspecified: Secondary | ICD-10-CM

## 2013-02-26 DIAGNOSIS — I4819 Other persistent atrial fibrillation: Secondary | ICD-10-CM | POA: Insufficient documentation

## 2013-02-26 NOTE — Progress Notes (Signed)
PCP: Carollee Herter, MD  Clinic Note: Chief Complaint  Patient presents with  . ROV 3 months    Some shortness of breath on exertion, terrible leg cramps-thinks it is due to Cartia, swelling of legs and ankles mostly at night, some lightheadedness. Also is complaining of back pain.    HPI: [name] is a 72 y.o. female with a PMH below who presents today for followup of her atrial fibrillation. She is a morbidly obese native of [place] was a former patient of Dr. Jonna Coup who I took over his cardiologist.  She has a long-standing history of recurrent paroxysmal atrial fibrillation which is persistent when it comes.  She's had several opportunities for cardioversion, and has subsequently gone back into atrial fibrillation.  She is now Multaq in the past and did not do well with it.  She does not want to do amiodarone.  She is currently on diltiazem and flecainide.  She's had a Myoview as well as an echocardiogram as noted below.  Interval History: She says she actually felt she was in normal sinus rhythm for quite a while, until Labor Day when she is got quite excited when her 2 dogs got into a fight and avoid a dog bit the small bowel.  She then noted irregularities with her pulse and more dyspnea on exertion.  She did not status is not going fast however.  No lightheadedness or dizziness.  She really only notes that she is in atrial fibrillation from a symptom standpoint if she is climbing up into her bed which takes quite a bit of effort.  The remainder of Cardiovascular ROS: positive for - dyspnea on exertion, edema, irregular heartbeat and shortness of breath negative for - chest pain, edema, loss of consciousness, murmur, orthopnea, palpitations, paroxysmal nocturnal dyspnea or rapid heart rate is as follows: Additional cardiac review of systems: Lightheadedness - no, dizziness - no, syncope/near-syncope - no; TIA/amaurosis fugax - no Melena - no, hematochezia no;  hematuria - no; nosebleeds - no; claudication - no    Past Medical History  Diagnosis Date  . Atrial fib/flutter, transient   . COPD (chronic obstructive pulmonary disease)   . TIA (transient ischemic attack)     A.) Carotid Duplex study, 10/08/2008,   . Thyroid disease     HYPOTHYROID  . Paroxysmal atrial fibrillation     A.) Cardioversion, Date: 08/16/2012, low tolerance B.) Echo, Date:09/06/2012, LV EF 60%-65%  . Obesity   . Chronic fatigue   . Chronic pain   . Hypothyroidism   . Dyspnea on exertion     A.) [place]ican Mycardial Perfusion with Wall Motion LVEF, Date:09/07/2011, TID ratio 1.03, lung to heart ratio 26%.    Prior Cardiac Evaluation and Past Surgical History: Past Surgical History  Procedure Laterality Date  . Joint replacement  12/08/2006    LEFT HIP   . Total hip arthroplasty  4782,9562  . Tonsillectomy    . Cardioversion N/A 08/16/2012    Procedure: CARDIOVERSION;  Surgeon: Marykay Lex, MD;  Location: [facility] ENDOSCOPY;  Service: Endoscopy;  Laterality: N/A;  labs on arrival   . Cardioversion N/A 09/14/2012    Procedure: CARDIOVERSION;  Surgeon: Chrystie Nose, MD;  Location: [facility] ENDOSCOPY;  Service: Cardiovascular;  Laterality: N/A;    No Known Allergies  Current Outpatient Prescriptions  Medication Sig Dispense Refill  . aspirin 81 MG tablet Take 81 mg by mouth daily.        Marland Kitchen diltiazem (CARDIZEM  CD) 180 MG 24 hr capsule Take 180 mg by mouth 2 (two) times daily.        . flecainide (TAMBOCOR) 100 MG tablet Take 100 mg by mouth 2 (two) times daily.      . furosemide (LASIX) 40 MG tablet Take 40 mg by mouth daily.        Marland Kitchen ibuprofen (ADVIL,MOTRIN) 200 MG tablet Take 400 mg by mouth every 6 (six) hours as needed for pain.      Marland Kitchen levothyroxine (SYNTHROID, LEVOTHROID) 125 MCG tablet Take 1 tablet (125 mcg total) by mouth daily.  90 tablet  0  . warfarin (COUMADIN) 2 MG tablet Take 2-3 mg by mouth daily. Take 1.5 tablets on Sunday, Monday, Tuesday, Wednesday,  Friday, and Saturday. Takes 2 tablet on thursday       No current facility-administered medications for this visit.    History   Social History Narrative  . No narrative on file    ROS: A comprehensive Review of Systems - Negative except Pertinent positives noted above.  She's also noted to be significant back pain which really limits her activity in the mornings.  She's been taking a lot of Advil and Aleve.  She is also noted to be significant amount of bruising on her abdomen and her arms.  If anything she bumps she bruises.  She has her baseline dyspnea from her COPD, and is starting to notice her allergies are kicking in.  PHYSICAL EXAM BP 116/78  Pulse 89  Ht 5\' 3"  (1.6 m)  Wt 229 lb 12.8 oz (104.237 kg)  BMI 40.72 kg/m2 General appearance: alert, cooperative, appears stated age, no distress, morbidly obese and Well-groomed.  She continues to be somewhat abrupt in her speech but is pleasant.  No increased worker breathing. Neck: no adenopathy, no carotid bruit, no JVD and supple, symmetrical, trachea midline Lungs: clear to auscultation bilaterally, normal percussion bilaterally and Diminished air movement throughout with increased AP diameter.  No real significant interstitial sounds. Heart: irregularly irregular rhythm, S1, S2 normal, no S3 or S4 and systolic murmur: holosystolic 1/6, blowing at lower left sternal border Abdomen: soft, non-tender; bowel sounds normal; no masses,  no organomegaly and Obese; there are several areas of small ecchymosis where she said her cat walked on her Extremities: edema 2+, bilateral lower extremities (she has not taken Lasix in 2 days) and venous stasis dermatitis noted Pulses: 2+ and symmetric Skin: Diffuse areas of ecchymosis on her abdomen as well as arms. Neurologic: Grossly normal  AVW:[id number] today: Yes Rate: 89  , Rhythm:  atrial fibrillation, otherwise normal   Recent Labs: None since 2013.    ASSESSMENT / PLAN: PAF (paroxysmal  atrial fibrillation) - recurrent; symptomatic Unfortunately, she continues to revert back into atrial fibrillation with persistent episodes.  Thankfully she is now asymptomatic now she used to be. Plan: We will try increasing the flecainide to 150 mg twice a day for 3 days to see if this will cardiovert her.  It does work, and this is a good when necessary option for when she goes back into it.  Otherwise she is to be well rate controlled on the current dose of diltiazem.  HYPERTENSION Her blood pressure is great today on the diltiazem.  Long term (current) use of anticoagulants On warfarin, monitored by Phillips Hay, RPH  I recommended not using NSAIDs for back pain relief, would recommend using extra strength Tylenol instead.  Severe obesity (BMI >= 40)  We discussed the need for  dietary modification, adjusting her caloric intake to meet her exertion.  Changing to increased vegetables and fruits with less processed foods. She is limited a lot by her activity level, so therefore the biggest thing she can do to lose weight is modified her diet.  Interestingly, her lipid evaluation in the past has been pretty well-controlled.  Most recent was interpreted in 13 record and the total cholesterol of 172 and LDL of 95.  In the absence of known vascular disease, and no reason to push it any further than that.  Her lipids are being followed by her primary physician, but if they're not checked by the time I see her back, will order it for routine surveillance.  Persistent atrial fibrillation Unfortunately, her episodes of atrial fibrillation seem to last quite a long time.  It is starting to look like she may have more chronic persistent as opposed to paroxysmal atrial fibrillation  Seasonal allergies I recommended that she start an H2 blocker and dig he chronically from now on until the end of November to avoid exacerbating her COPD and possibly atrial fibrillation with allergy  exacerbations.    Orders Placed This Encounter  Procedures  . EKG 12-Lead   No orders of the defined types were placed in this encounter.    Followup:  6 months  -- will discuss his lipid control at that time.    Nykira Reddix W. Herbie Baltimore, M.D., M.S. [facility] [address] [place], [place]  [address]  [phone] Pager # [phone]

## 2013-02-26 NOTE — Patient Instructions (Signed)
You seem to be doing OK --> you are in Afib, but controlled rate.  Lets try taking Flecainide 1 1/2 tab 2 x daily x 3 days -- if you feel like you convert to a normal rhythm, then this can be an option for AS NEEDED treatment if you feel like you go back into Afib. If not, then just go back to 1 tab 2 x daily.  Avoid taking Advil / Aleve -- use Tylenol (700 mg to 1000 mg 1 to 2 times daily for back pain).  For your allergies - it is that season - use OTC allergy meds (loratidine etc - generic Allegra, Claritin, etc.) take it daily until December.  Marykay Lex, MD  Your physician wants you to follow-up in: 6 months. You will receive a reminder letter in the mail two months in advance. If you don't receive a letter, please call our office to schedule the follow-up appointment.

## 2013-02-26 NOTE — Assessment & Plan Note (Signed)
Unfortunately, her episodes of atrial fibrillation seem to last quite a long time.  It is starting to look like she may have more chronic persistent as opposed to paroxysmal atrial fibrillation

## 2013-02-26 NOTE — Assessment & Plan Note (Signed)
I recommended that she start an H2 blocker and dig he chronically from now on until the end of November to avoid exacerbating her COPD and possibly atrial fibrillation with allergy exacerbations.

## 2013-02-26 NOTE — Assessment & Plan Note (Signed)
Her blood pressure is great today on the diltiazem.

## 2013-02-26 NOTE — Assessment & Plan Note (Addendum)
On warfarin, monitored by Phillips Hay, RPH  I recommended not using NSAIDs for back pain relief, would recommend using extra strength Tylenol instead.

## 2013-02-26 NOTE — Assessment & Plan Note (Addendum)
We discussed the need for dietary modification, adjusting her caloric intake to meet her exertion.  Changing to increased vegetables and fruits with less processed foods. She is limited a lot by her activity level, so therefore the biggest thing she can do to lose weight is modified her diet.  Interestingly, her lipid evaluation in the past has been pretty well-controlled.  Most recent was interpreted in 13 record and the total cholesterol of 172 and LDL of 95.  In the absence of known vascular disease, and no reason to push it any further than that.  Her lipids are being followed by her primary physician, but if they're not checked by the time I see her back, will order it for routine surveillance.

## 2013-02-26 NOTE — Assessment & Plan Note (Signed)
Unfortunately, she continues to revert back into atrial fibrillation with persistent episodes.  Thankfully she is now asymptomatic now she used to be. Plan: We will try increasing the flecainide to 150 mg twice a day for 3 days to see if this will cardiovert her.  It does work, and this is a good when necessary option for when she goes back into it.  Otherwise she is to be well rate controlled on the current dose of diltiazem.

## 2013-02-27 ENCOUNTER — Telehealth: Payer: Self-pay | Admitting: Cardiology

## 2013-02-27 NOTE — Telephone Encounter (Signed)
Please call-cant find any of her paper work that Dr Herbie Baltimore gave her yesterday,

## 2013-02-27 NOTE — Telephone Encounter (Signed)
Jasmine December notified and Pt Instructions printed.  Unable to print entire AVS.  Call to pt and informed.  Will pick up today.

## 2013-02-27 NOTE — Telephone Encounter (Signed)
Returned call.  Pt wants to know if she can pick up a copy of the paperwork Dr. Herbie Baltimore printed for her yesterday.  Stated she lost all of it.  Pt informed Jasmine December, RN will be notified.  Pt verbalized understanding and agreed w/ plan.

## 2013-02-27 NOTE — Telephone Encounter (Signed)
Returned call.  Left message to call back before 4pm.  

## 2013-02-27 NOTE — Telephone Encounter (Signed)
Patient is returning Amber's call.  It is ok to leave a message per patient.

## 2013-03-05 ENCOUNTER — Other Ambulatory Visit: Payer: Self-pay | Admitting: Cardiovascular Disease

## 2013-03-06 ENCOUNTER — Ambulatory Visit: Payer: Medicare Other | Admitting: Pharmacist Clinician (PhC)/ Clinical Pharmacy Specialist

## 2013-03-06 DIAGNOSIS — I4891 Unspecified atrial fibrillation: Secondary | ICD-10-CM

## 2013-03-06 DIAGNOSIS — I480 Paroxysmal atrial fibrillation: Secondary | ICD-10-CM

## 2013-03-06 DIAGNOSIS — Z7901 Long term (current) use of anticoagulants: Secondary | ICD-10-CM

## 2013-03-06 LAB — PROTIME-INR: INR: 4.41 — ABNORMAL HIGH (ref <1.50)

## 2013-03-20 ENCOUNTER — Other Ambulatory Visit: Payer: Self-pay | Admitting: Cardiovascular Disease

## 2013-03-21 LAB — PROTIME-INR
INR: 3.33 — ABNORMAL HIGH (ref <1.50)
Prothrombin Time: 32.2 seconds — ABNORMAL HIGH (ref 11.6–15.2)

## 2013-03-22 ENCOUNTER — Telehealth: Payer: Self-pay | Admitting: Cardiology

## 2013-03-22 ENCOUNTER — Ambulatory Visit (INDEPENDENT_AMBULATORY_CARE_PROVIDER_SITE_OTHER): Payer: Medicare Other | Admitting: Pharmacist Clinician (PhC)/ Clinical Pharmacy Specialist

## 2013-03-22 DIAGNOSIS — Z79899 Other long term (current) drug therapy: Secondary | ICD-10-CM

## 2013-03-22 DIAGNOSIS — Z7901 Long term (current) use of anticoagulants: Secondary | ICD-10-CM

## 2013-03-22 DIAGNOSIS — I480 Paroxysmal atrial fibrillation: Secondary | ICD-10-CM

## 2013-03-22 DIAGNOSIS — I4891 Unspecified atrial fibrillation: Secondary | ICD-10-CM

## 2013-03-22 LAB — BASIC METABOLIC PANEL
BUN: 16 mg/dL (ref 6–23)
Chloride: 99 mEq/L (ref 96–112)
Creat: 1 mg/dL (ref 0.50–1.10)
Potassium: 3.7 mEq/L (ref 3.5–5.3)

## 2013-03-22 MED ORDER — POTASSIUM CHLORIDE CRYS ER 20 MEQ PO TBCR: 20.0000 meq | EXTENDED_RELEASE_TABLET | Freq: Every day | ORAL | Status: DC

## 2013-03-22 NOTE — Telephone Encounter (Signed)
Lets check a BMP to make sure that her K is low.   Can write for Potassium (Kdur daily).  Marykay Lex, MD

## 2013-03-22 NOTE — Telephone Encounter (Signed)
Returning Hospital doctor call

## 2013-03-22 NOTE — Telephone Encounter (Signed)
Patient has medication questions. °

## 2013-03-22 NOTE — Telephone Encounter (Signed)
Returned call.  Pt stated since [facility] changed diltiazem to [place], she has been having leg cramps.  Stated she called them to switch her back to diltiazem and they told her they cannot do that w/o a doctor's order and that they think the cramps are from Lasix and she should get a prescription for potassium.  Pt informed Nancie Neas is a brand name of diltiazem.  Pt also informed her leg cramps may be coming from Lasix as it is a diuretic.  Pt informed Dr. Herbie Baltimore will be notified to see if he would like to add potassium to her medication regimen.  Pt verbalized understanding.  Stated she was on Lasix as needed, but now she takes it daily b/c of the swelling.  Pt informed that may explain the increase in leg cramps since she is taking Lasix daily instead of as needed.  Pt wanted to know how long it would take for Dr. Herbie Baltimore to respond and advised to allow at least 24 hrs.  If no response, pt will call back tomorrow afternoon and ask for Jasmine December, Dr. Elissa Hefty nurse.  Pt would like a call on her cell if she doesn't answer the home phone.   Message forwarded to Dr. Herbie Baltimore.

## 2013-03-22 NOTE — Telephone Encounter (Signed)
Returned call to [employer].  Stated she got it situated.

## 2013-03-22 NOTE — Telephone Encounter (Signed)
Returned call.  Left message to call back before 4pm.  

## 2013-03-22 NOTE — Telephone Encounter (Signed)
Returned call and informed pt per instructions by MD/PA.  Pt verbalized understanding and agreed w/ plan.  Labs and Rx ordered.  Pt will go to lab today or tomorrow to complete.

## 2013-03-26 ENCOUNTER — Ambulatory Visit (INDEPENDENT_AMBULATORY_CARE_PROVIDER_SITE_OTHER): Payer: Medicare Other | Admitting: Family Medicine

## 2013-03-26 ENCOUNTER — Telehealth: Payer: Self-pay | Admitting: *Deleted

## 2013-03-26 ENCOUNTER — Telehealth: Payer: Self-pay | Admitting: Cardiology

## 2013-03-26 ENCOUNTER — Encounter: Payer: Self-pay | Admitting: Family Medicine

## 2013-03-26 VITALS — Wt 223.0 lb

## 2013-03-26 DIAGNOSIS — H6123 Impacted cerumen, bilateral: Secondary | ICD-10-CM

## 2013-03-26 DIAGNOSIS — H6120 Impacted cerumen, unspecified ear: Secondary | ICD-10-CM

## 2013-03-26 MED ORDER — POTASSIUM CHLORIDE CRYS ER 20 MEQ PO TBCR: 40.0000 meq | EXTENDED_RELEASE_TABLET | Freq: Every day | ORAL | Status: DC

## 2013-03-26 NOTE — Telephone Encounter (Signed)
Need a week prescription for her Potassium.She need it called in to [facility] call today.

## 2013-03-26 NOTE — Telephone Encounter (Signed)
Called patient to clarify potassium dose and frequency.  Take 2 tablets ( total) by mouth once daily.   Notes Recorded by Marykay Lex, MD on 03/23/2013 at 12:15 AM K is a bit low => 40 mEq of KDur daily while taking standing lasix.  Marykay Lex, MD   Patient verbalized understanding.

## 2013-03-26 NOTE — Telephone Encounter (Signed)
Message copied by Tobin Chad on Mon Mar 26, 2013  4:29 PM ------      Message from: Herbie Baltimore, DAVID W      Created: Fri Mar 23, 2013 12:15 AM       K is a bit low => 40 mEq of KDur daily while taking standing lasix.        Marykay Lex, MD       ------

## 2013-03-26 NOTE — Telephone Encounter (Signed)
Questions about dosage of potassium pill  Please call

## 2013-03-26 NOTE — Telephone Encounter (Signed)
Returned call and informed pt.  Pt verbalized understanding and agreed w/ plan.

## 2013-03-26 NOTE — Progress Notes (Signed)
  Subjective:    Patient ID: [name], female    DOB: [birth year], 72 y.o.   MRN: [id number]  HPI She has apparently had difficulty with hearing especially from the left ear and was told she had earwax buildup. No earache, sore throat, congestion.   Review of Systems     Objective:   Physical Exam Cerumen noted in both canals especially the left. It was irrigated without difficulty. The TMs and canals are normal. No cervical adenopathy noted.       Assessment & Plan:  Cerumen impaction, bilateral  encouraged her to not use Q-tips.

## 2013-03-26 NOTE — Telephone Encounter (Signed)
Spoke to patient. Result given . Verbalized understanding  

## 2013-03-26 NOTE — Telephone Encounter (Signed)
Pt wanted to know K+ result.  Reviewed labs.  See note from Dr. Herbie Baltimore below.  Notes Recorded by Marykay Lex, MD on 03/23/2013 at 12:15 AM K is a bit low => 40 mEq of KDur daily while taking standing lasix.  Marykay Lex, MD  Results given per MD.  Pt informed Rx was sent last week and she should be receiving it soon.  Pt advised to take two pills as Rx Dr. Herbie Baltimore initially advised was for 20 mEq.  Pt verbalized understanding and agreed w/ plan.  Rx sent to pharmacy w/ instructions to hold until refill due and pt aware she needs to take two pills daily.

## 2013-03-26 NOTE — Telephone Encounter (Signed)
Returned call.  Line busy x 3 and mobile voicemail not set up.  Will try again later.    Rx sent to pharmacy.

## 2013-03-29 ENCOUNTER — Telehealth: Payer: Self-pay | Admitting: Cardiology

## 2013-03-29 NOTE — Telephone Encounter (Signed)
Picked up at [facility] says take 2 potassium  Got from [facility] and bottle instructions says take 1  pill

## 2013-03-29 NOTE — Telephone Encounter (Signed)
Returned call.  Pt informed message received.  Pt stated both say 20 mEq and one says take one daily, other two daily.  Pt informed Rx was sent prior to obtaining lab results and original Rx was for 20 mEq daily.  When results received, changed to 40 mEq daily.  Pt informed she should take a total of 40 mEq daily and that Rx with updated directions has already been sent.  Pt verbalized understanding and agreed w/ plan.

## 2013-04-09 ENCOUNTER — Telehealth: Payer: Self-pay | Admitting: Cardiology

## 2013-04-09 DIAGNOSIS — Z79899 Other long term (current) drug therapy: Secondary | ICD-10-CM

## 2013-04-09 NOTE — Telephone Encounter (Signed)
Spoke with patient in reference to her potassium tablets. She informs me that she cannot take 2 of them due to size. States that the legs are feeling better. She is now only going to take 1 tablet instead of 2. She will get her blood checked this week when she gets her INR. Orders placed into the epic system.

## 2013-04-09 NOTE — Telephone Encounter (Signed)
Dr Herbie Baltimore prescribed potassium pills and she cannot take   They are too big.  She is supposed to take 2 a day and cannot take so has cut it down to 1 pill.  Please call.

## 2013-04-10 ENCOUNTER — Other Ambulatory Visit: Payer: Self-pay | Admitting: Cardiovascular Disease

## 2013-04-10 LAB — PROTIME-INR
INR: 2.56 — ABNORMAL HIGH (ref <1.50)
Prothrombin Time: 26.5 seconds — ABNORMAL HIGH (ref 11.6–15.2)

## 2013-04-10 LAB — BASIC METABOLIC PANEL
BUN: 15 mg/dL (ref 6–23)
Chloride: 99 mEq/L (ref 96–112)
Creat: 1.02 mg/dL (ref 0.50–1.10)
Potassium: 4.2 mEq/L (ref 3.5–5.3)
Sodium: 136 mEq/L (ref 135–145)

## 2013-04-11 ENCOUNTER — Ambulatory Visit (INDEPENDENT_AMBULATORY_CARE_PROVIDER_SITE_OTHER): Payer: Medicare Other | Admitting: Pharmacist Clinician (PhC)/ Clinical Pharmacy Specialist

## 2013-04-11 DIAGNOSIS — I4891 Unspecified atrial fibrillation: Secondary | ICD-10-CM

## 2013-04-11 DIAGNOSIS — Z7901 Long term (current) use of anticoagulants: Secondary | ICD-10-CM

## 2013-04-11 DIAGNOSIS — I480 Paroxysmal atrial fibrillation: Secondary | ICD-10-CM

## 2013-04-12 ENCOUNTER — Other Ambulatory Visit: Payer: Self-pay

## 2013-04-13 ENCOUNTER — Telehealth: Payer: Self-pay | Admitting: Internal Medicine

## 2013-04-13 ENCOUNTER — Other Ambulatory Visit: Payer: Self-pay | Admitting: Family Medicine

## 2013-04-13 MED ORDER — LEVOTHYROXINE SODIUM 125 MCG PO TABS: 125.0000 ug | ORAL_TABLET | Freq: Every day | ORAL | Status: DC

## 2013-04-13 NOTE — Telephone Encounter (Signed)
Refill request or levothyroxine #90 [facility]

## 2013-04-13 NOTE — Telephone Encounter (Signed)
Rx refill sent. CLS 

## 2013-04-18 ENCOUNTER — Telehealth: Payer: Self-pay | Admitting: *Deleted

## 2013-04-18 NOTE — Telephone Encounter (Signed)
Spoke to patient. Result given . Verbalized understanding  

## 2013-04-18 NOTE — Telephone Encounter (Signed)
Message copied by Tobin Chad on Wed Apr 18, 2013  9:12 AM ------      Message from: Marykay Lex      Created: Wed Apr 11, 2013 12:18 AM       Renal function & Potassium are both better & stable.            Marykay Lex, MD       ------

## 2013-04-27 ENCOUNTER — Encounter: Payer: Self-pay | Admitting: Internal Medicine

## 2013-04-30 ENCOUNTER — Encounter: Payer: Self-pay | Admitting: Cardiology

## 2013-04-30 ENCOUNTER — Ambulatory Visit (INDEPENDENT_AMBULATORY_CARE_PROVIDER_SITE_OTHER): Payer: Medicare Other | Admitting: Cardiology

## 2013-04-30 VITALS — BP 120/64 | HR 95 | Ht 63.0 in | Wt 230.5 lb

## 2013-04-30 DIAGNOSIS — J302 Other seasonal allergic rhinitis: Secondary | ICD-10-CM

## 2013-04-30 DIAGNOSIS — R0602 Shortness of breath: Secondary | ICD-10-CM

## 2013-04-30 DIAGNOSIS — R600 Localized edema: Secondary | ICD-10-CM

## 2013-04-30 DIAGNOSIS — Z7901 Long term (current) use of anticoagulants: Secondary | ICD-10-CM

## 2013-04-30 DIAGNOSIS — I482 Chronic atrial fibrillation, unspecified: Secondary | ICD-10-CM

## 2013-04-30 DIAGNOSIS — J309 Allergic rhinitis, unspecified: Secondary | ICD-10-CM

## 2013-04-30 DIAGNOSIS — I10 Essential (primary) hypertension: Secondary | ICD-10-CM

## 2013-04-30 DIAGNOSIS — R609 Edema, unspecified: Secondary | ICD-10-CM

## 2013-04-30 DIAGNOSIS — G459 Transient cerebral ischemic attack, unspecified: Secondary | ICD-10-CM

## 2013-04-30 DIAGNOSIS — I4891 Unspecified atrial fibrillation: Secondary | ICD-10-CM

## 2013-04-30 MED ORDER — POTASSIUM CHLORIDE CRYS ER 20 MEQ PO TBCR: 40.0000 meq | EXTENDED_RELEASE_TABLET | Freq: Every day | ORAL | Status: DC

## 2013-04-30 MED ORDER — DIGOXIN 125 MCG PO TABS: 0.1250 mg | ORAL_TABLET | Freq: Every day | ORAL | Status: DC

## 2013-04-30 NOTE — Patient Instructions (Addendum)
I want you to wean off of the Flecainide -> 1/2 tab 2 x daily x 4-6 days then stop.  Starting Digoxin 0.125 mg daily.  Will switch to Potassium Packets.  Follow-up 4-6 months  HARDING,DAVID W, MD

## 2013-05-01 ENCOUNTER — Encounter: Payer: Self-pay | Admitting: Cardiology

## 2013-05-01 DIAGNOSIS — I482 Chronic atrial fibrillation, unspecified: Secondary | ICD-10-CM

## 2013-05-01 DIAGNOSIS — R600 Localized edema: Secondary | ICD-10-CM | POA: Insufficient documentation

## 2013-05-01 DIAGNOSIS — G459 Transient cerebral ischemic attack, unspecified: Secondary | ICD-10-CM | POA: Insufficient documentation

## 2013-05-01 HISTORY — DX: Chronic atrial fibrillation, unspecified: I48.20

## 2013-05-01 NOTE — Assessment & Plan Note (Signed)
Probably multifactorial. Combination of A. fib with increasing rate will exercise, some diastolic dysfunction on echo more likely, obesity and deconditioning.

## 2013-05-01 NOTE — Assessment & Plan Note (Signed)
Well controlled today. Continue current medications 

## 2013-05-01 NOTE — Assessment & Plan Note (Signed)
She seems to think that her allergies are really what's causing her to be short of breath. Again I recommended taking H2 blockers. She may also benefit from being on a standing COPD medication. Perhaps an albuterol inhaler but a longer acting inhaler may be effective.

## 2013-05-01 NOTE — Assessment & Plan Note (Signed)
No significant stenosis on carotid Dopplers in the past.

## 2013-05-01 NOTE — Assessment & Plan Note (Addendum)
I am not clear that her edema is related to anything for cardiac standpoint although she may have some elevated pulmonary pressures that are exacerbated with atrophic. The third current diuretic dose is stable for her and she seems to be relatively free of any significant edema. No PND or orthopnea.  She has had some potassium issues and is on supplement that she is not able tolerate. Will switch her from the pills to the powder packet.

## 2013-05-01 NOTE — Assessment & Plan Note (Signed)
She did not tolerate the increased dose of flecainide, made her very lethargic and nauseated. She now appeared to be persistently/chronic A. fib rate control agent is probably less effective now. We'll discontinue flecainide and to continue the Cardizem at current dose. We'll add digoxin for additional rate control.  She seemed to be doing okay from a symptom standpoint with rate control as long as she is not tachycardic.  On warfarin for anticoagulation.

## 2013-05-01 NOTE — Progress Notes (Signed)
PATIENT: [name] MRN: [id number]  DOB: 25-Dec-1940   DOV:05/01/2013 PCP: Carollee Herter, MD  Clinic Note: Chief Complaint  Patient presents with  . ROV    C/o occas chest pain, shortness of breath-COPD, leg pain all the time, and occas lightheadedness. In a-fib.   HPI: [name] is a 72 y.o.  female with a PMH notable for what is probably chronic atrial fibrillation, COPD seasonal allergies as well as diastolic dysfunction who presents today for two-month followup. My original plan had been for her to come back in 6 months, however she is here earlier simply because she noted having some worsening edema when she saw Warren Danes, [facility] for her routine INR check. Restart her on low-dose Lasix, and since then the edema is notably improved.  Interval History: Today she notes that she does feel some irregular heartbeat, but nothing as she occasionally feels twinges in her chest, not long-lasting. Some of it is at night when lying down to bed. It is not necessarily exertional in nature. Between getting up to the bathroom, and her to take her dogs for a walk early in the morning. With that she is tired a lot during the day. She does note an irregular heartbeat but nothing rapid. She denies any lightheadedness, dizziness or syncope/near-syncope. No recurrent TIA or amaurosis fugax symptoms. The discomfort she described her chest is really almost epigastric and less substernal. It is not made worse with exertion. Does have baseline dyspnea with exertion but has got a low worse with her allergies getting worse, but is not necessarily anything unusual for her. She does get some nighttime cramping that she's been getting worse and she was switched from one generic brand of diltiazem to another.  She denies any PND, orthopnea or edema. No lightheadedness or dizziness. No melena, hematochezia or hematuria. No nose bleeds. No claudication.  Past Medical History  Diagnosis Date  . COPD (chronic  obstructive pulmonary disease)   . TIA (transient ischemic attack) 2010    A.) Carotid Duplex study, 10/08/2008, minimal disease  . Thyroid disease     HYPOTHYROID  . Chronic a-fib     A.) Cardioversion, Date: 08/16/2012, low tolerance B.) Echo, Date:09/06/2012, LV EF 60%-65%  . Obesity   . Chronic fatigue   . Chronic pain   . Hypothyroidism   . Dyspnea on exertion     A.) Lexican Mycardial Perfusion with Wall Motion LVEF, Date:09/07/2011, TID ratio 1.03, lung to heart ratio 26%.  . Seasonal allergies     Prior Cardiac Evaluation and Past Surgical History: Past Surgical History  Procedure Laterality Date  . Joint replacement  12/08/2006    LEFT HIP   . Total hip arthroplasty  4098,1191  . Tonsillectomy    . Cardioversion N/A 08/16/2012    Procedure: CARDIOVERSION;  Surgeon: Marykay Lex, MD;  Location: [facility] ENDOSCOPY;  Service: Endoscopy;  Laterality: N/A;  labs on arrival   . Cardioversion N/A 09/14/2012    Procedure: CARDIOVERSION;  Surgeon: Chrystie Nose, MD;  Location: [facility] ENDOSCOPY;  Service: Cardiovascular;  Laterality: N/A;  . Transthoracic echocardiogram  09-2012    Moderate concentric LVH. EF 60-65%.; Mild LA dilation.  Marland Kitchen Nm myoview ltd  April 2013    No evidence of ischemia or infarction. LOW RISK;  normal EF    No Known Allergies  Current Outpatient Prescriptions  Medication Sig Dispense Refill  . acetaminophen (TYLENOL) 325 MG tablet Take 325 mg by mouth as needed.      Marland Kitchen  aspirin 81 MG tablet Take 81 mg by mouth daily.        Marland Kitchen diltiazem (CARDIZEM CD) 180 MG 24 hr capsule Take 180 mg by mouth 2 (two) times daily.        . furosemide (LASIX) 40 MG tablet Take 40 mg by mouth daily.        Marland Kitchen levothyroxine (SYNTHROID, LEVOTHROID) 125 MCG tablet Take 1 tablet (125 mcg total) by mouth daily.  90 tablet  0  . warfarin (COUMADIN) 2 MG tablet Take 2 mg by mouth daily.       . digoxin (LANOXIN) 0.125 MG tablet Take 1 tablet (0.125 mg total) by mouth daily.  90 tablet  3    . potassium chloride SA (K-DUR,KLOR-CON) 20 MEQ tablet Take 2 tablets (40 mEq total) by mouth daily.  90 tablet  3   No current facility-administered medications for this visit.    History   Social History Narrative   She is divorced, and is accompanied by her long-time companion/friend who is always with her. They are both transplants from [place] (she is from [place]).   She is a former smoker, who quit in 2006. She claims to never have "inhaled."   She does not get routine exercise but she and her friend are very active.    ROS: A comprehensive Review of Systems - Negative except Pertinent Center as noted above in history of present illness. Others below General ROS: positive for  - fever and sleep disturbance Allergy and Immunology ROS: positive for - nasal congestion, postnasal drip and seasonal allergies  PHYSICAL EXAM BP 120/64  Pulse 95  Ht 5\' 3"  (1.6 m)  Wt 230 lb 8 oz (104.554 kg)  BMI 40.84 kg/m2 General appearance: alert and oriented x3, cooperative, appears stated age, no distress, morbidly obese and Pleasant, garrulous mood and affect. HEENT: [place]/AT, EOMI, MMM, anicteric sclera Neck: no adenopathy, no carotid bruit, no JVD and supple, symmetrical, trachea midline Lungs: clear to auscultation bilaterally and normal percussion bilaterally Heart: irregularly irregular rhythm, S1, S2 normal, no S3 or S4 and 1/6 HSM at LLSB/apex Abdomen: soft, non-tender; bowel sounds normal; no masses,  no organomegaly and Obese Extremities: edema Trace-1+, no ulcers, gangrene or trophic changes and No significant stasis dermatitis and Pulses: 2+ and symmetric Neurologic: Grossly normal  [id number] today: Yes Rate: 95 , Rhythm: A. fib with CVR. 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 No significant change;   Recent Labs: None available.  ASSESSMENT / PLAN: Atrial fibrillation, chronic She did not tolerate  the increased dose of flecainide, made her very lethargic and nauseated. She now appeared to be persistently/chronic A. fib rate control agent is probably less effective now. We'll discontinue flecainide and to continue the Cardizem at current dose. We'll add digoxin for additional rate control.  She seemed to be doing okay from a symptom standpoint with rate control as long as she is not tachycardic.  On warfarin for anticoagulation.  Long term (current) use of anticoagulants Due for INR check next week with Phillips Hay, RPH-CRP.  No active bleeding  Seasonal allergies She seems to think that her allergies are really what's causing her to be short of breath. Again I recommended taking H2 blockers. She may also benefit from being on a standing COPD medication. Perhaps an albuterol inhaler but a longer acting inhaler may be effective.  Severe obesity (BMI >= 40) Again talked about dietary modification and the need  for exercise. She keeps saying that she change her diet, but never does. She also just simply is not as active as she needs to be.  HYPERTENSION Well-controlled today. Continue current medications.  TIA (transient ischemic attack) No significant stenosis on carotid Dopplers in the past.  Edema of both legs I am not clear that her edema is related to anything for cardiac standpoint although she may have some elevated pulmonary pressures that are exacerbated with atrophic. The third current diuretic dose is stable for her and she seems to be relatively free of any significant edema. No PND or orthopnea.  She has had some potassium issues and is on supplement that she is not able tolerate. Will switch her from the pills to the powder packet.  Exertional shortness of breath Probably multifactorial. Combination of A. fib with increasing rate will exercise, some diastolic dysfunction on echo more likely, obesity and deconditioning.   Orders Placed This Encounter  Procedures  .  EKG 12-Lead   Meds ordered this encounter  Medications  . potassium chloride SA (K-DUR,KLOR-CON) 20 MEQ tablet    Sig: Take 2 tablets (40 mEq total) by mouth daily.    Dispense:  90 tablet    Refill:  3  . digoxin (LANOXIN) 0.125 MG tablet    Sig: Take 1 tablet (0.125 mg total) by mouth daily.    Dispense:  90 tablet    Refill:  3   Followup: 6 months  DAVID W. Herbie Baltimore, M.D., M.S. [facility] [address] [place], [place]  [address]  [phone] Pager # [phone]

## 2013-05-01 NOTE — Assessment & Plan Note (Signed)
Again talked about dietary modification and the need for exercise. She keeps saying that she change her diet, but never does. She also just simply is not as active as she needs to be.

## 2013-05-01 NOTE — Assessment & Plan Note (Addendum)
Due for INR check next week with Phillips Hay, RPH-CRP.  No active bleeding

## 2013-05-07 ENCOUNTER — Other Ambulatory Visit: Payer: Self-pay | Admitting: Cardiovascular Disease

## 2013-05-07 ENCOUNTER — Other Ambulatory Visit: Payer: Self-pay | Admitting: *Deleted

## 2013-05-07 MED ORDER — POTASSIUM CHLORIDE CRYS ER 20 MEQ PO TBCR: 40.0000 meq | EXTENDED_RELEASE_TABLET | Freq: Every day | ORAL | Status: DC

## 2013-05-08 ENCOUNTER — Ambulatory Visit (INDEPENDENT_AMBULATORY_CARE_PROVIDER_SITE_OTHER): Payer: Medicare Other | Admitting: Pharmacist Clinician (PhC)/ Clinical Pharmacy Specialist

## 2013-05-08 DIAGNOSIS — Z7901 Long term (current) use of anticoagulants: Secondary | ICD-10-CM

## 2013-05-08 LAB — PROTIME-INR
INR: 2.19 — ABNORMAL HIGH (ref <1.50)
Prothrombin Time: 23.6 seconds — ABNORMAL HIGH (ref 11.6–15.2)

## 2013-05-08 NOTE — Telephone Encounter (Signed)
Please call-concerning her medicine.-Disregard this message-she is going to call [facility] first.

## 2013-05-14 ENCOUNTER — Telehealth: Payer: Self-pay | Admitting: Cardiology

## 2013-05-14 NOTE — Telephone Encounter (Signed)
[facility] sent her letter that they cannot verify the dosage on klor-con.  Please call.  She needs packet form.

## 2013-05-15 MED ORDER — POTASSIUM CHLORIDE 20 MEQ PO PACK: PACK | ORAL | Status: DC

## 2013-05-15 NOTE — Telephone Encounter (Signed)
Attempted to call patient. Unable to reach 

## 2013-05-15 NOTE — Telephone Encounter (Signed)
Rx was sent to pharmacy electronically. 

## 2013-05-16 ENCOUNTER — Telehealth: Payer: Self-pay | Admitting: Cardiology

## 2013-05-16 NOTE — Telephone Encounter (Signed)
I think we stoppe multaq.  Marykay Lex, MD

## 2013-05-16 NOTE — Telephone Encounter (Signed)
She found some sample pills,she does not know who put her on them or what she is taking it for.The name of the medicine is Multaq.

## 2013-05-16 NOTE — Telephone Encounter (Signed)
Reviewed chart.  It appears Multaq was entered as a historical med on 4.10.14 when pt had cardioversion and was d/c'd on 5.9.14 as an error.  RN unable to determine if pt should be taking Multaq or not.  Will forward to Dr. Herbie Baltimore to advise.  Message forwarded to Dr. Herbie Baltimore.

## 2013-05-16 NOTE — Telephone Encounter (Signed)
Returned call and informed pt per instructions by MD/PA.  Pt advised not to take it and to discard it.  Pt verbalized understanding and agreed w/ plan.

## 2013-05-18 ENCOUNTER — Telehealth: Payer: Self-pay | Admitting: Pharmacist Clinician (PhC)/ Clinical Pharmacy Specialist

## 2013-05-18 DIAGNOSIS — I4891 Unspecified atrial fibrillation: Secondary | ICD-10-CM

## 2013-05-18 DIAGNOSIS — Z7901 Long term (current) use of anticoagulants: Secondary | ICD-10-CM

## 2013-05-18 NOTE — Telephone Encounter (Signed)
Standing labs to [employer]

## 2013-05-30 ENCOUNTER — Telehealth: Payer: Self-pay | Admitting: Cardiology

## 2013-05-30 NOTE — Telephone Encounter (Signed)
Please call-every time she takes Dioxgin she has diarrhea. She also stated that her pulse rate is dropping real low at times.

## 2013-05-30 NOTE — Telephone Encounter (Signed)
Spoke to patient. She states she has diarrhea after taking digoxin. She states its been like this every since starting ,but then she states she has been having diarrhea few weeks. RN informed her to switch the time when she takes medication to see if it helps.Suggest she contacts PCP about the diarrhea  Patient states she has home pulse ox machine and one reading read 46. RN informed pt continue to monitor. Pt vebalized understanding.

## 2013-06-04 ENCOUNTER — Telehealth: Payer: Self-pay | Admitting: Cardiology

## 2013-06-04 NOTE — Telephone Encounter (Signed)
Returned call and informed pt per instructions by NP.  Pt verbalized understanding and agreed w/ plan.    Diarrhea x 1 month, after starting digoxin.

## 2013-06-04 NOTE — Telephone Encounter (Signed)
Agree - with holding for a few days - restart ~on weekend if diarrhea resolved.    Unlikely to cause Sx this late after starting.  Maybe cut back to 1/2 dose -- cant remember dose.  If 0.25 then do 0.125; if 0.125 then do QOD.  Marykay Lex, MD

## 2013-06-04 NOTE — Telephone Encounter (Signed)
Hold the lanoxin for 4 days and see if any change in the diarrhea, call and let us know.  How long has she had diarrhea?  Did it start with her taking digoxin?  Could she have a viral illness?

## 2013-06-04 NOTE — Telephone Encounter (Signed)
Returned call and pt verified x 2.  Pt informed message received.  Pt stated she cannot take digoxin.  Stated she has had diarrhea for a month.  Stated she cannot leave the house b/c it comes out when she walks or bends over.  Stated she stopped it last night and is not taking it anymore.  Stated she read that it can cause diarrhea, but thought it would have gone away by now.  Pt informed Dr. Herbie Baltimore is out of the office until next week and another provider will be notified for further instructions.  Pt verbalized understanding and agreed w/ plan.  Message forwarded to L. Annie Paras, NP for further instructions.

## 2013-06-04 NOTE — Telephone Encounter (Signed)
Says was given dioxgin at last office visit.  Has had bad constant diarhea since .  She quit taking last night and wants to know what he wants her to do.  Cannot continue taking.   Please call

## 2013-06-11 ENCOUNTER — Other Ambulatory Visit: Payer: Self-pay | Admitting: Cardiovascular Disease

## 2013-06-11 ENCOUNTER — Telehealth: Payer: Self-pay | Admitting: Cardiology

## 2013-06-11 NOTE — Telephone Encounter (Signed)
No plans to add a different medication @ this point.  Marykay LexHARDING,Banjamin Stovall W, MD

## 2013-06-11 NOTE — Telephone Encounter (Signed)
Returned call.  Pt stated diarrhea has eased off, but it depends on what she eats.  Stated she gets diarrhea from time to time anyway, but she ate pork and sauerkraut yesterday and had diarrhea.  Stated she completely stopped the pill the Saturday after Christmas and is not taking them anymore.  Stated she threw them away.  Pt informed Dr. [name] will be notified in case he would like to change digoxin to another med.  Pt verbalized understanding and agreed w/ plan.  Message forwarded to Dr. [name].  Digoxin taken off med list.

## 2013-06-11 NOTE — Telephone Encounter (Signed)
Wanted Amber to know she still having just a little diahrrea but has seemed to really have eased off since stopping med.  FYI unless you need to talk to her.

## 2013-06-12 ENCOUNTER — Ambulatory Visit (INDEPENDENT_AMBULATORY_CARE_PROVIDER_SITE_OTHER): Payer: Medicare Other | Admitting: Pharmacist Clinician (PhC)/ Clinical Pharmacy Specialist

## 2013-06-12 DIAGNOSIS — Z7901 Long term (current) use of anticoagulants: Secondary | ICD-10-CM

## 2013-06-12 LAB — PROTIME-INR
INR: 2.52 — AB (ref <1.50)
PROTHROMBIN TIME: 26.5 s — AB (ref 11.6–15.2)

## 2013-07-02 ENCOUNTER — Telehealth: Payer: Self-pay | Admitting: Internal Medicine

## 2013-07-02 ENCOUNTER — Other Ambulatory Visit: Payer: Self-pay

## 2013-07-02 MED ORDER — DILTIAZEM HCL ER COATED BEADS 180 MG PO CP24: 180.0000 mg | ORAL_CAPSULE | Freq: Two times a day (BID) | ORAL | Status: DC

## 2013-07-02 MED ORDER — LEVOTHYROXINE SODIUM 125 MCG PO TABS: 125.0000 ug | ORAL_TABLET | Freq: Every day | ORAL | Status: DC

## 2013-07-02 NOTE — Telephone Encounter (Signed)
Rx was sent to pharmacy electronically.  Patient requested K+ packets instead of tablets. Pharnacy notified.

## 2013-07-02 NOTE — Telephone Encounter (Signed)
Refill request for levothyroxine #90 to [facility]

## 2013-07-02 NOTE — Telephone Encounter (Signed)
Pt aware to sch appt rx filled

## 2013-07-04 ENCOUNTER — Other Ambulatory Visit: Payer: Self-pay | Admitting: Pharmacist Clinician (PhC)/ Clinical Pharmacy Specialist

## 2013-07-04 MED ORDER — WARFARIN SODIUM 2.5 MG PO TABS: 2.5000 mg | ORAL_TABLET | Freq: Every day | ORAL | Status: DC

## 2013-07-12 ENCOUNTER — Other Ambulatory Visit: Payer: Self-pay | Admitting: Cardiovascular Disease

## 2013-07-13 ENCOUNTER — Ambulatory Visit (INDEPENDENT_AMBULATORY_CARE_PROVIDER_SITE_OTHER): Payer: Medicare Other | Admitting: Pharmacist Clinician (PhC)/ Clinical Pharmacy Specialist

## 2013-07-13 ENCOUNTER — Telehealth: Payer: Self-pay | Admitting: *Deleted

## 2013-07-13 DIAGNOSIS — Z7901 Long term (current) use of anticoagulants: Secondary | ICD-10-CM

## 2013-07-13 LAB — PROTIME-INR
INR: 2.55 — ABNORMAL HIGH (ref <1.50)
Prothrombin Time: 26.8 seconds — ABNORMAL HIGH (ref 11.6–15.2)

## 2013-07-13 NOTE — Telephone Encounter (Signed)
I spoke with patient today for INR results, she asked about the muscle aches that she believes started when she switched to [place].  In speaking with her, she admitted that she only takes potassium 1-2 times per week, and not usually both tablets.  (Dose is 40mEq qd).  I explained that low potassium could be the cause of these muscle issues, especially as she still takes her fluid pill daily.  She states too hard to swallow potassium tablets, even when broken in 1/2.  I gave her some suggestions for dissolving tablet in water and drinking.  I explained that insurance will not allow us to request mft of diltiazem unless we can prove that Nancie NeasCartia is cause of problem, and we can't rule out low potassium unless she takes it as directed for at least 7-10 days.  Pt voiced understanding and will make better effort to take potassium.  I advised her to call after at least 1 week to let us know how she is feeling. Belenda CruiseKristin

## 2013-07-13 NOTE — Telephone Encounter (Signed)
Pt stated that she is on Cartia and was not told that she was switching medication. Pt stated that she is in a-fib and she can not walk and wants to know if she can come off of this medication. Pt stated that she just wants to come off this medication. She wants to come talk to him, but does not want to wait till her appointment in March.   DH

## 2013-07-14 NOTE — Telephone Encounter (Signed)
Great advice.   Lets see how she does with regular K+ dosing.  Marykay LexHARDING,Andreya Lacks W, MD

## 2013-07-18 ENCOUNTER — Telehealth: Payer: Self-pay | Admitting: *Deleted

## 2013-07-18 NOTE — Telephone Encounter (Signed)
Patient walked in --states she wanted to know if she really needs to take it. She states she feels it is giving me sore joints cramps in legs ,tired. "CAN I STOP THE MEDICINE"   RN informed patient she will need an appointment to discuss it with dr [name]- she states she has an appointment3/2015  does not want to see extender -  Will defer to Dr [name].

## 2013-07-18 NOTE — Telephone Encounter (Signed)
What medicine is she talking about?  Marykay LexHARDING,DAVID W, MD

## 2013-07-18 NOTE — Telephone Encounter (Signed)
[employer]

## 2013-07-19 NOTE — Telephone Encounter (Signed)
?   Do u think we can do the paperwork to get the 24 hr dose approved?    Marykay LexHARDING,Raetta Agostinelli W, MD

## 2013-07-25 NOTE — Telephone Encounter (Signed)
Per last conversation with patient .She wants to stop medication (Cartia) totally. Dr [name] will discuss at next appointment

## 2013-08-06 ENCOUNTER — Telehealth: Payer: Self-pay | Admitting: Cardiology

## 2013-08-06 NOTE — Telephone Encounter (Signed)
Message forwarded to Sharon, RN.  

## 2013-08-06 NOTE — Telephone Encounter (Signed)
The [place]ia have been approved for a year. Effective date is 08-03-13 to 08-03-14.

## 2013-08-16 ENCOUNTER — Other Ambulatory Visit: Payer: Self-pay | Admitting: Cardiovascular Disease

## 2013-08-16 LAB — PROTIME-INR
INR: 3.31 — ABNORMAL HIGH (ref <1.50)
PROTHROMBIN TIME: 32.7 s — AB (ref 11.6–15.2)

## 2013-08-17 ENCOUNTER — Ambulatory Visit (INDEPENDENT_AMBULATORY_CARE_PROVIDER_SITE_OTHER): Payer: Medicare Other | Admitting: Pharmacist Clinician (PhC)/ Clinical Pharmacy Specialist

## 2013-08-17 DIAGNOSIS — Z7901 Long term (current) use of anticoagulants: Secondary | ICD-10-CM

## 2013-08-30 ENCOUNTER — Encounter: Payer: Self-pay | Admitting: Cardiology

## 2013-08-30 ENCOUNTER — Ambulatory Visit (INDEPENDENT_AMBULATORY_CARE_PROVIDER_SITE_OTHER): Payer: Medicare Other | Admitting: Cardiology

## 2013-08-30 VITALS — BP 110/64 | HR 94 | Ht 63.0 in | Wt 218.3 lb

## 2013-08-30 DIAGNOSIS — E669 Obesity, unspecified: Secondary | ICD-10-CM

## 2013-08-30 DIAGNOSIS — G459 Transient cerebral ischemic attack, unspecified: Secondary | ICD-10-CM

## 2013-08-30 DIAGNOSIS — I4891 Unspecified atrial fibrillation: Secondary | ICD-10-CM

## 2013-08-30 DIAGNOSIS — R0602 Shortness of breath: Secondary | ICD-10-CM

## 2013-08-30 DIAGNOSIS — E039 Hypothyroidism, unspecified: Secondary | ICD-10-CM

## 2013-08-30 DIAGNOSIS — R600 Localized edema: Secondary | ICD-10-CM

## 2013-08-30 DIAGNOSIS — R609 Edema, unspecified: Secondary | ICD-10-CM

## 2013-08-30 DIAGNOSIS — I482 Chronic atrial fibrillation, unspecified: Secondary | ICD-10-CM

## 2013-08-30 DIAGNOSIS — I10 Essential (primary) hypertension: Secondary | ICD-10-CM

## 2013-08-30 MED ORDER — PREDNISONE (PAK) 10 MG PO TABS: ORAL_TABLET | ORAL | Status: DC

## 2013-08-30 MED ORDER — DILTIAZEM HCL ER COATED BEADS 240 MG PO CP24: 240.0000 mg | ORAL_CAPSULE | Freq: Every day | ORAL | Status: DC

## 2013-08-30 NOTE — Patient Instructions (Signed)
Medication changes: Change Diltiazem to 240 mg once a day  Begin the Prednisone Taper pack as needed for allergies.    Your physician recommends that you schedule a follow-up appointment in: 6 MONTH follow up with Dr.Harding

## 2013-08-31 NOTE — Assessment & Plan Note (Signed)
She is on Lasix as well as potassium. We tried to switch her from the potassium pills of potassium packets but for some reason this is not being given to her when she goes for her prescriptions. I am not sure what else we can do besides right prescription prescription like it is for packets.

## 2013-08-31 NOTE — Assessment & Plan Note (Signed)
She seems to be doing relatively well for the most part with rate control. Has issues with her diltiazem she then switched over to [place] by her insurance company. His major nauseated. She is doing okay currently. Did not do well with Flecainide. Her heart rate is a little faster than I would like it to be.  Plan:   Increase diltiazem dose to 240 mg daily.  Continue warfarin

## 2013-08-31 NOTE — Assessment & Plan Note (Signed)
Again multifactorial. I don't think it is the eighth rib. Significance deconditioning, obesity, COPD and now worse because of her seasonal allergies.  Plan:  Continue additional rate control as above  Prednisone taper for allergies, along with OTC Zyrtec

## 2013-08-31 NOTE — Assessment & Plan Note (Signed)
Well controlled 

## 2013-08-31 NOTE — Progress Notes (Signed)
@[email or web address] @  PCP: Carollee Herter, MD  Clinic Note: Chief Complaint  Patient presents with  . Follow-up    chest tightness with activity, dyspnea with activity,    HPI: [name] is a 73 y.o. female with a Cardiovascular Problem List below who presents today for followup of her atrial fibrillation treated she says it is now has chronic atrial fibrillation. We tried cardioversion with rhythm control, but has had recurrence of A. fib. She did not tolerate amiodarone or flecainide. She is now basically in stable rate controlled atrial fibrillation, anticoagulated with warfarin. She is also troubled by seasonal allergies and COPD. With her atrial fib, she occasionally has mild diastolic heart failure symptoms.  Interval History: She presents today with the classic allergy congestion and chest tightness with exertion is more from her having a difficult time catching her breath. She was noticing that her irregular heartbeats from her A. fib. He has a lot of edema but nothing significant. Her biggest thing is that she's gotten behind with her allergies and is now having a difficult time with getting in good night sleep due to congestion. She is also wheezing more than usual.  She's been taking potassium pills and having a difficult time doing that. Otherwise her cardiac standpoint. She really denies any lightheadedness or dizziness except for when she is congested. No TIA or amaurosis fugax in his penis and perineum significant no nodularities or hematuria. No claudication. No nosebleeds.  She has been really working on her diet and her exercise until this recent bout of allergies. She's lost 12 pounds after having gained some weight back during the post Thanksgiving timeframe.  Past Medical History  Diagnosis Date  . COPD (chronic obstructive pulmonary disease)   . TIA (transient ischemic attack) 2010    A.) Carotid Duplex study, 10/08/2008, minimal disease  . Thyroid disease     HYPOTHYROID   . Chronic a-fib     A.) Cardioversion, Date: 08/16/2012, low tolerance B.) Echo, Date:09/06/2012, LV EF 60%-65%  . Obesity   . Chronic fatigue   . Chronic pain   . Hypothyroidism   . Dyspnea on exertion     A.) [facility]ican Mycardial Perfusion with Wall Motion LVEF, Date:09/07/2011, TID ratio 1.03, lung to heart ratio 26%.  . Seasonal allergies     Prior Cardiac Evaluation and Past Surgical History: Past Surgical History  Procedure Laterality Date  . Joint replacement  12/08/2006    LEFT HIP   . Total hip arthroplasty  3244,0102  . Tonsillectomy    . Cardioversion N/A 08/16/2012    Procedure: CARDIOVERSION;  Surgeon: Marykay Lex, MD;  Location: [facility] ENDOSCOPY;  Service: Endoscopy;  Laterality: N/A;  labs on arrival   . Cardioversion N/A 09/14/2012    Procedure: CARDIOVERSION;  Surgeon: Chrystie Nose, MD;  Location: [facility] ENDOSCOPY;  Service: Cardiovascular;  Laterality: N/A;  . Transthoracic echocardiogram  09-2012    Moderate concentric LVH. EF 60-65%.; Mild LA dilation.  [facility]  April 2013    No evidence of ischemia or infarction. LOW RISK;  normal EF    MEDICATIONS AND ALLERGIES REVIEWED IN EPIC No Change in Social and Family History  ROS: A comprehensive Review of Systems - Negative except Symptoms noted history of present illness and below: Allergy and Immunology ROS: positive for - itchy/watery eyes, nasal congestion, postnasal drip, seasonal allergies and She is starting to cough from postnasal drip negative for - hives, insect bite sensitivity or latex Respiratory ROS: positive  for - cough and wheezing negative for - hemoptysis, orthopnea, sputum changes, stridor or tachypnea Musculoskeletal ROS: positive for - Arthritis pain in her hips and knees.  PHYSICAL EXAM BP 110/64  Pulse 94  Ht 5\' 3"  (1.6 m)  Wt 218 lb 4.8 oz (99.02 kg)  BMI 38.68 kg/m2 General appearance: alert, cooperative, appears stated age, no distress and moderately obese HEENT: [place]/AT, EOMI,  MMM, anicteric sclera Neck: no adenopathy, no carotid bruit and no JVD Lungs: Mostly clear, with mild expiratory stridor and wheezing; normal percussion bilaterally and non-labored Heart: Irregularly irregular rhythm normal rate, S1, S2 normal, 1/6 HSM at LLSB; no click, rub or gallop ; nondisplaced PMI Abdomen: soft, non-tender; bowel sounds normal; no masses,  no organomegaly; truncal obesity Extremities: extremities normal, atraumatic, no cyanosis, and 1+ edema Pulses: 2+ and symmetric;  Neurologic: Mental status: Alert, oriented, thought content appropriate; Cranial nerves: normal (II-XII grossly intact)    Adult ECG Report  Rate: 94 ;  Rhythm: atrial fibrillation and With some aberrantly conducted beats  QRS Axis: 31 ;  PR Interval: * ;  QRS Duration: 82 ; QTc: 432;  Voltages: Normal  Conduction Disturbances: none  Other Abnormalities: Nonspecific ST-T wave changes.   Narrative Interpretation: A. fib with intermittent PVCs versus aberrantly conducted beats  Recent Labs: None   ASSESSMENT / PLAN: Atrial fibrillation, chronic She seems to be doing relatively well for the most part with rate control. Has issues with her diltiazem she then switched over to [place] by her insurance company. His major nauseated. She is doing okay currently. Did not do well with Flecainide. Her heart rate is a little faster than I would like it to be.  Plan:   Increase diltiazem dose to 240 mg daily.  Continue warfarin  HYPERTENSION Well-controlled.  Exertional shortness of breath Again multifactorial. I don't think it is the eighth rib. Significance deconditioning, obesity, COPD and now worse because of her seasonal allergies.  Plan:  Continue additional rate control as above  Prednisone taper for allergies, along with OTC Zyrtec  Edema of both legs She is on Lasix as well as potassium. We tried to switch her from the potassium pills of potassium packets but for some reason this is not being  given to her when she goes for her prescriptions. I am not sure what else we can do besides right prescription prescription like it is for packets.  Obesity (BMI 35.0-39.9 without comorbidity) - 12lb weight loss from last visit. I congratulated her on her efforts. Continue to encourage dietary modification as well as increased exercise. Hopefully with getting her over her allergies she can get back into doing that now.    Orders Placed This Encounter  Procedures  . EKG 12-Lead   Meds ordered this encounter  Medications  . diltiazem (CARDIZEM CD) 240 MG 24 hr capsule    Sig: Take 1 capsule (240 mg total) by mouth daily.    Dispense:  90 capsule    Refill:  3  . predniSONE (STERAPRED UNI-PAK) 10 MG tablet    Sig: Take as directed    Dispense:  21 tablet    Refill:  4    Followup: 6 months  Levon Boettcher W. [name], M.D., M.S. Interventional Cardiologist [facility]

## 2013-08-31 NOTE — Assessment & Plan Note (Signed)
I congratulated her on her efforts. Continue to encourage dietary modification as well as increased exercise. Hopefully with getting her over her allergies she can get back into doing that now.

## 2013-09-07 ENCOUNTER — Other Ambulatory Visit: Payer: Self-pay | Admitting: Cardiovascular Disease

## 2013-09-08 LAB — PROTIME-INR
INR: 2.48 — ABNORMAL HIGH (ref <1.50)
PROTHROMBIN TIME: 26.2 s — AB (ref 11.6–15.2)

## 2013-09-10 ENCOUNTER — Telehealth: Payer: Self-pay | Admitting: *Deleted

## 2013-09-10 ENCOUNTER — Ambulatory Visit (INDEPENDENT_AMBULATORY_CARE_PROVIDER_SITE_OTHER): Payer: Medicare Other | Admitting: Pharmacist Clinician (PhC)/ Clinical Pharmacy Specialist

## 2013-09-10 DIAGNOSIS — Z7901 Long term (current) use of anticoagulants: Secondary | ICD-10-CM

## 2013-09-10 NOTE — Telephone Encounter (Signed)
Returned call and pt verified x 2.  Pt informed message received and per OV note, she was to change diltiazem (Cartia) to 240 mg once daily.  Pt stated she did not look at AVS, but that's all she called for.  Call ended.

## 2013-09-10 NOTE — Telephone Encounter (Signed)
Pt was calling in regards to her Cartia. She stated that she thought Dr. [name] told her to take bid and the Rx on the bottle says qd. She would like some clarification.  DH

## 2013-10-15 ENCOUNTER — Other Ambulatory Visit: Payer: Self-pay | Admitting: Cardiovascular Disease

## 2013-10-16 ENCOUNTER — Ambulatory Visit (INDEPENDENT_AMBULATORY_CARE_PROVIDER_SITE_OTHER): Payer: Medicare Other | Admitting: Pharmacist Clinician (PhC)/ Clinical Pharmacy Specialist

## 2013-10-16 DIAGNOSIS — Z7901 Long term (current) use of anticoagulants: Secondary | ICD-10-CM

## 2013-10-16 LAB — PROTIME-INR
INR: 2.15 — ABNORMAL HIGH (ref <1.50)
PROTHROMBIN TIME: 23.5 s — AB (ref 11.6–15.2)

## 2013-10-22 ENCOUNTER — Telehealth: Payer: Self-pay | Admitting: Internal Medicine

## 2013-10-22 MED ORDER — LEVOTHYROXINE SODIUM 125 MCG PO TABS: 125.0000 ug | ORAL_TABLET | Freq: Every day | ORAL | Status: DC

## 2013-10-22 NOTE — Telephone Encounter (Signed)
Pt needs appt before medication can be refilled. Pt notified and states she will CB in a little while to schedule OV

## 2013-10-22 NOTE — Telephone Encounter (Signed)
refill request for levothyroxine (GENERIC FOR SYNTHROID) #90 to [facility]

## 2013-10-23 ENCOUNTER — Other Ambulatory Visit: Payer: Self-pay

## 2013-10-23 MED ORDER — FUROSEMIDE 40 MG PO TABS: 40.0000 mg | ORAL_TABLET | Freq: Every day | ORAL | Status: DC

## 2013-10-23 NOTE — Telephone Encounter (Signed)
Rx was sent to pharmacy electronically. 

## 2013-11-13 ENCOUNTER — Encounter: Payer: Self-pay | Admitting: Family Medicine

## 2013-11-13 ENCOUNTER — Telehealth: Payer: Self-pay | Admitting: Cardiology

## 2013-11-13 ENCOUNTER — Ambulatory Visit (INDEPENDENT_AMBULATORY_CARE_PROVIDER_SITE_OTHER): Payer: Medicare Other | Admitting: Family Medicine

## 2013-11-13 VITALS — BP 110/74 | Wt 213.0 lb

## 2013-11-13 DIAGNOSIS — H6120 Impacted cerumen, unspecified ear: Secondary | ICD-10-CM

## 2013-11-13 DIAGNOSIS — E039 Hypothyroidism, unspecified: Secondary | ICD-10-CM

## 2013-11-13 NOTE — Telephone Encounter (Signed)
[name] was increased to 240mg 08/30/13 during her last visit here. Patient voiced understanding.  180mg dose taken off of med list. 

## 2013-11-13 NOTE — Telephone Encounter (Signed)
[name] was increased to 240mg  08/30/13 during her last visit here. Patient voiced understanding.  180mg  dose taken off of med list.

## 2013-11-13 NOTE — Progress Notes (Signed)
   Subjective:    Patient ID: [name], female    DOB: [birth year], 73 y.o.   MRN: [id number]  HPI She is here because of difficulty hearing from both ears. No pain, sore throat, congestion or coughing. She also needs her thyroid testing.   Review of Systems     Objective:   Physical Exam Alert and in no distress. Cerumen present in both ears and it was removed with lavage without difficulty. Tympanic membranes and canal are normal.       Assessment & Plan:  Cerumen impaction  HYPOTHYROIDISM - Plan: TSH  recommend she return here in the near future for complete exam.

## 2013-11-13 NOTE — Telephone Encounter (Signed)
Please let her know what milligram of Cartia does she take. Is it 180 mg or 240 mg?If she is not there leave it on answering machine.

## 2013-11-14 ENCOUNTER — Telehealth: Payer: Self-pay | Admitting: Family Medicine

## 2013-11-14 LAB — TSH: TSH: 0.066 u[IU]/mL — ABNORMAL LOW (ref 0.350–4.500)

## 2013-11-14 MED ORDER — LEVOTHYROXINE SODIUM 112 MCG PO TABS: 112.0000 ug | ORAL_TABLET | Freq: Every day | ORAL | Status: DC

## 2013-11-14 NOTE — Addendum Note (Signed)
Addended by: Ronnald Nian on: 11/14/2013 02:59 PM   Modules accepted: Orders

## 2013-11-14 NOTE — Progress Notes (Signed)
   Subjective:    Patient ID: [name], female    DOB: 1941/06/01, 73 y.o.   MRN: [id number]  HPI    Review of Systems     Objective:   Physical Exam        Assessment & Plan:  The TSH is low. I will readjust her Synthroid and have her recheck this in 2 months

## 2013-11-15 ENCOUNTER — Telehealth: Payer: Self-pay

## 2013-11-15 ENCOUNTER — Telehealth: Payer: Self-pay | Admitting: *Deleted

## 2013-11-15 ENCOUNTER — Ambulatory Visit (INDEPENDENT_AMBULATORY_CARE_PROVIDER_SITE_OTHER): Payer: Medicare Other | Admitting: Cardiology

## 2013-11-15 ENCOUNTER — Other Ambulatory Visit: Payer: Self-pay | Admitting: Cardiovascular Disease

## 2013-11-15 DIAGNOSIS — W540XXA Bitten by dog, initial encounter: Secondary | ICD-10-CM

## 2013-11-15 DIAGNOSIS — T148XXA Other injury of unspecified body region, initial encounter: Secondary | ICD-10-CM

## 2013-11-15 MED ORDER — LEVOTHYROXINE SODIUM 112 MCG PO TABS: 112.0000 ug | ORAL_TABLET | Freq: Every day | ORAL | Status: DC

## 2013-11-15 NOTE — Telephone Encounter (Signed)
Error

## 2013-11-15 NOTE — Progress Notes (Signed)
Pt seen by Burna Mortimer CMA for dog bite. Referred back to her primary MD who saw her for this yesterday.  Corine Shelter PA-C 11/15/2013 4:38 PM

## 2013-11-15 NOTE — Telephone Encounter (Signed)
error 

## 2013-11-15 NOTE — Telephone Encounter (Signed)
Patient walked-into office wanting someone to look at a 41 week old dog bite. She was evaluated for this by her orthopedic MD and given antibiotics which she only took for 1 week. She was then seen yesterday by her PCP, Dr. Sharlot Gowda for another issue  and she also showed it to him. Per patient he told her that it was fine. The site was not draining, discolored nor red to the touch. There was only visible puncture wounds. After consulting with Corine Shelter it was recommended that patient return to her PCP for additional evaluation and treatment. Patient voiced her understanding of this and will call his office tomorrow.

## 2013-11-15 NOTE — Telephone Encounter (Signed)
Medication switched to [facility]

## 2013-11-15 NOTE — Patient Instructions (Signed)
After consulting with Franky Macho. It is recommended that you return to your PCP for evaluation of the 61 week old dog bite.

## 2013-11-16 LAB — PROTIME-INR
INR: 2.64 — AB (ref <1.50)
Prothrombin Time: 27.5 seconds — ABNORMAL HIGH (ref 11.6–15.2)

## 2013-11-20 ENCOUNTER — Encounter: Payer: Medicare Other | Admitting: Family Medicine

## 2013-11-21 ENCOUNTER — Ambulatory Visit (INDEPENDENT_AMBULATORY_CARE_PROVIDER_SITE_OTHER): Payer: Medicare Other | Admitting: Pharmacist Clinician (PhC)/ Clinical Pharmacy Specialist

## 2013-11-21 ENCOUNTER — Telehealth: Payer: Self-pay | Admitting: Pharmacist Clinician (PhC)/ Clinical Pharmacy Specialist

## 2013-11-21 DIAGNOSIS — Z7901 Long term (current) use of anticoagulants: Secondary | ICD-10-CM

## 2013-11-21 NOTE — Telephone Encounter (Signed)
Patient states that she had her protime drawn last Thursday and has not heard the results.

## 2013-11-21 NOTE — Telephone Encounter (Signed)
See anticoag note

## 2013-12-24 ENCOUNTER — Other Ambulatory Visit: Payer: Self-pay | Admitting: Cardiology

## 2013-12-24 LAB — PROTIME-INR
INR: 3.09 — ABNORMAL HIGH (ref <1.50)
Prothrombin Time: 31.9 seconds — ABNORMAL HIGH (ref 11.6–15.2)

## 2013-12-26 ENCOUNTER — Ambulatory Visit: Payer: Medicare Other | Admitting: Pharmacist Clinician (PhC)/ Clinical Pharmacy Specialist

## 2013-12-26 DIAGNOSIS — Z7901 Long term (current) use of anticoagulants: Secondary | ICD-10-CM

## 2014-01-14 ENCOUNTER — Other Ambulatory Visit: Payer: Self-pay | Admitting: Pharmacist Clinician (PhC)/ Clinical Pharmacy Specialist

## 2014-01-14 MED ORDER — WARFARIN SODIUM 2.5 MG PO TABS: 2.5000 mg | ORAL_TABLET | Freq: Every day | ORAL | Status: DC

## 2014-01-21 ENCOUNTER — Telehealth: Payer: Self-pay | Admitting: Cardiovascular Disease

## 2014-01-21 MED ORDER — POTASSIUM CHLORIDE ER 10 MEQ PO CPCR: 10.0000 meq | ORAL_CAPSULE | Freq: Two times a day (BID) | ORAL | Status: DC

## 2014-01-21 NOTE — Telephone Encounter (Signed)
Pt need a new prescription for Klor-Con 10 mg please,she can not swallow the 20 mg.Please call it in to [facility].

## 2014-01-21 NOTE — Telephone Encounter (Signed)
Rx was changed to 10 mg capsule BID per Dr.Harding. Rx sent into patient pharmacy

## 2014-01-24 ENCOUNTER — Other Ambulatory Visit: Payer: Self-pay | Admitting: Cardiology

## 2014-01-25 ENCOUNTER — Ambulatory Visit (INDEPENDENT_AMBULATORY_CARE_PROVIDER_SITE_OTHER): Payer: Medicare Other | Admitting: Pharmacist Clinician (PhC)/ Clinical Pharmacy Specialist

## 2014-01-25 DIAGNOSIS — Z7901 Long term (current) use of anticoagulants: Secondary | ICD-10-CM

## 2014-01-25 LAB — PROTIME-INR
INR: 3.51 — ABNORMAL HIGH (ref <1.50)
Prothrombin Time: 35.2 seconds — ABNORMAL HIGH (ref 11.6–15.2)

## 2014-01-28 ENCOUNTER — Telehealth: Payer: Self-pay | Admitting: Cardiology

## 2014-01-28 ENCOUNTER — Other Ambulatory Visit: Payer: Self-pay | Admitting: Pharmacist Clinician (PhC)/ Clinical Pharmacy Specialist

## 2014-01-28 MED ORDER — POTASSIUM CHLORIDE ER 10 MEQ PO TBCR: EXTENDED_RELEASE_TABLET | ORAL | Status: DC

## 2014-01-28 NOTE — Telephone Encounter (Signed)
Please call,concerning a new medicine she received in place of Klor-Con.

## 2014-01-28 NOTE — Telephone Encounter (Signed)
Pt called stated she needs you to call her pharmacy [phone] and ask for a teir exception on her potassium supplement

## 2014-01-29 ENCOUNTER — Telehealth: Payer: Self-pay | Admitting: Pharmacist Clinician (PhC)/ Clinical Pharmacy Specialist

## 2014-01-29 NOTE — Telephone Encounter (Signed)
Please call,said she forgot how you said she was suppose to take her potassium medicine.

## 2014-01-29 NOTE — Telephone Encounter (Signed)
Pt correct dose should be 40 mEq, can take 20 bid or 40 qd.  Pt voiced understanding

## 2014-01-30 ENCOUNTER — Telehealth: Payer: Self-pay | Admitting: Cardiology

## 2014-01-30 NOTE — Telephone Encounter (Signed)
Received form and when complete  Will fax

## 2014-01-30 NOTE — Telephone Encounter (Signed)
When you call ask for Part D.A form was faxed on [phone] regarding her Potassium,she was just following up on that form.

## 2014-01-31 ENCOUNTER — Telehealth: Payer: Self-pay | Admitting: Cardiology

## 2014-01-31 NOTE — Telephone Encounter (Signed)
Pt just called,please call so she wont have to pay for her Potassium . Please do this today.

## 2014-01-31 NOTE — Telephone Encounter (Signed)
FAXED TIER EXCEPTION FORM 1 [phone] DR HARDING SIGNED TODAY

## 2014-01-31 NOTE — Telephone Encounter (Signed)
Please ask for Part D when you call. She is trying to get her Potassium approved,this is the 3rd attempt.If there is no answer in 24 hrs it will be denied.

## 2014-02-04 NOTE — Telephone Encounter (Signed)
Notified patient. expection tier 1 has been approved thorough 02/01/15 Verbalized understanding.

## 2014-02-08 ENCOUNTER — Other Ambulatory Visit: Payer: Self-pay | Admitting: Cardiology

## 2014-02-09 LAB — PROTIME-INR
INR: 3.33 — AB (ref <1.50)
Prothrombin Time: 33.8 seconds — ABNORMAL HIGH (ref 11.6–15.2)

## 2014-02-13 ENCOUNTER — Ambulatory Visit (INDEPENDENT_AMBULATORY_CARE_PROVIDER_SITE_OTHER): Payer: Medicare Other | Admitting: Pharmacist Clinician (PhC)/ Clinical Pharmacy Specialist

## 2014-02-13 DIAGNOSIS — Z7901 Long term (current) use of anticoagulants: Secondary | ICD-10-CM

## 2014-02-14 ENCOUNTER — Ambulatory Visit (INDEPENDENT_AMBULATORY_CARE_PROVIDER_SITE_OTHER): Payer: Medicare Other | Admitting: Family Medicine

## 2014-02-14 ENCOUNTER — Encounter: Payer: Self-pay | Admitting: Family Medicine

## 2014-02-14 ENCOUNTER — Telehealth: Payer: Self-pay | Admitting: Family Medicine

## 2014-02-14 VITALS — BP 120/60 | HR 60 | Wt 202.0 lb

## 2014-02-14 DIAGNOSIS — E039 Hypothyroidism, unspecified: Secondary | ICD-10-CM

## 2014-02-14 DIAGNOSIS — I482 Chronic atrial fibrillation, unspecified: Secondary | ICD-10-CM

## 2014-02-14 DIAGNOSIS — I4891 Unspecified atrial fibrillation: Secondary | ICD-10-CM

## 2014-02-14 MED ORDER — LEVOTHYROXINE SODIUM 112 MCG PO TABS: 112.0000 ug | ORAL_TABLET | Freq: Every day | ORAL | Status: DC

## 2014-02-14 NOTE — Telephone Encounter (Signed)
Pt called she had gotten a notification from her pharmacy about her medication dosage changing back in June.  So she never decreased the amount of Levothryoxine to 112 mcg she has been taking the 125 mcg.  She didn't get the MyChart notification that she had too much Thyroid on board and to decrease her meds and return in 2 months.  Pt had signed up for MY CHART but her computer died and she did not know any of this.  I have de-activated her MY CHART, pt aware.  Pt will come in today for visit, she states not feeling good, sweating a lot, blood count wrong.

## 2014-02-14 NOTE — Progress Notes (Signed)
   Subjective:    Patient ID: [name], female    DOB: 01/11/1941, 73 y.o.   MRN: [id number]  HPI She is here for recheck. Due to communication issues she did not get information stating she should cut back on her thyroid. She has been on 125 mcg dosing for the last several months. She states that over the last several months she has become quite fatigued, had difficulty with sweating as well as heat intolerance. She complains of sleep disturbance but then blames the animals that sleep in the bed with her.   Review of Systems     Objective:   Physical Exam Alert and in no distress. Skin shows normal. DTRs are 2-3+. Cardiac exam shows an irregular rhythm.       Assessment & Plan:  HYPOTHYROIDISM - Plan: levothyroxine (SYNTHROID, LEVOTHROID) 112 MCG tablet  Atrial fibrillation, chronic  her symptoms could be due to too much thyroid and I will therefore cut her back to the 112 mcg dosing and recheck this in roughly 10 weeks. She is comfortable with this.

## 2014-03-06 ENCOUNTER — Other Ambulatory Visit: Payer: Self-pay | Admitting: Cardiology

## 2014-03-06 LAB — PROTIME-INR
INR: 1.97 — AB (ref <1.50)
PROTHROMBIN TIME: 22.4 s — AB (ref 11.6–15.2)

## 2014-03-08 ENCOUNTER — Ambulatory Visit (INDEPENDENT_AMBULATORY_CARE_PROVIDER_SITE_OTHER): Payer: Medicare Other | Admitting: Pharmacist Clinician (PhC)/ Clinical Pharmacy Specialist

## 2014-03-25 ENCOUNTER — Encounter: Payer: Self-pay | Admitting: Cardiology

## 2014-03-25 ENCOUNTER — Ambulatory Visit (INDEPENDENT_AMBULATORY_CARE_PROVIDER_SITE_OTHER): Payer: Medicare Other | Admitting: Cardiology

## 2014-03-25 VITALS — BP 110/70 | HR 85 | Ht 63.0 in | Wt 201.9 lb

## 2014-03-25 DIAGNOSIS — I482 Chronic atrial fibrillation, unspecified: Secondary | ICD-10-CM

## 2014-03-25 DIAGNOSIS — I10 Essential (primary) hypertension: Secondary | ICD-10-CM

## 2014-03-25 DIAGNOSIS — R0602 Shortness of breath: Secondary | ICD-10-CM

## 2014-03-25 DIAGNOSIS — R600 Localized edema: Secondary | ICD-10-CM

## 2014-03-25 DIAGNOSIS — E878 Other disorders of electrolyte and fluid balance, not elsewhere classified: Secondary | ICD-10-CM

## 2014-03-25 DIAGNOSIS — E669 Obesity, unspecified: Secondary | ICD-10-CM

## 2014-03-25 MED ORDER — POTASSIUM CHLORIDE ER 10 MEQ PO TBCR: EXTENDED_RELEASE_TABLET | ORAL | Status: DC

## 2014-03-25 NOTE — Patient Instructions (Addendum)
Your physician recommends that you schedule a follow-up appointment in: 6 months With Dr. [name]  We have ordered labs for you to get done

## 2014-03-26 ENCOUNTER — Encounter: Payer: Self-pay | Admitting: Cardiology

## 2014-03-26 ENCOUNTER — Telehealth: Payer: Self-pay | Admitting: Cardiology

## 2014-03-26 LAB — COMPLETE METABOLIC PANEL WITH GFR
ALBUMIN: 4 g/dL (ref 3.5–5.2)
ALK PHOS: 83 U/L (ref 39–117)
ALT: 14 U/L (ref 0–35)
AST: 19 U/L (ref 0–37)
BILIRUBIN TOTAL: 0.5 mg/dL (ref 0.2–1.2)
BUN: 13 mg/dL (ref 6–23)
CO2: 29 meq/L (ref 19–32)
Calcium: 9.7 mg/dL (ref 8.4–10.5)
Chloride: 100 mEq/L (ref 96–112)
Creat: 0.99 mg/dL (ref 0.50–1.10)
GFR, EST AFRICAN AMERICAN: 65 mL/min
GFR, EST NON AFRICAN AMERICAN: 57 mL/min — AB
GLUCOSE: 73 mg/dL (ref 70–99)
POTASSIUM: 3.9 meq/L (ref 3.5–5.3)
SODIUM: 140 meq/L (ref 135–145)
TOTAL PROTEIN: 7.3 g/dL (ref 6.0–8.3)

## 2014-03-26 LAB — MAGNESIUM: MAGNESIUM: 1.7 mg/dL (ref 1.5–2.5)

## 2014-03-26 MED ORDER — POTASSIUM CHLORIDE ER 10 MEQ PO TBCR: 10.0000 meq | EXTENDED_RELEASE_TABLET | Freq: Four times a day (QID) | ORAL | Status: DC

## 2014-03-26 NOTE — Telephone Encounter (Signed)
Pt called in stating that she needs a refill of her Potassium 10 MEQ sent in to [facility]. She also stated that the directions should be that she takes this qid instead of bid. Please call  thanks

## 2014-03-26 NOTE — Assessment & Plan Note (Signed)
Well-controlled with calcium channel blocker only.

## 2014-03-26 NOTE — Progress Notes (Signed)
PCP: Carollee HerterLALONDE,JOHN CHARLES, MD  Clinic Note: Chief Complaint  Patient presents with  . Follow-up    8011m; tightness in chest, SOB, some slight dizziness, no other complaints    HPI: [name] is a 73 y.o. female with a PMH below who presents today for followup of what is now essentially chronic persistent atrial fibrillation, hypertension and diastolic dysfunction.   She is a former patient of Dr. Clarene DukeLittle who has failed cardioversion. She was initially on Flecainide that was no longer able to keep her in NSR.  We did try Amiodarone for a short term -- but she did not tolerate it due to GI upset -N/V & diarrhea. She also has significant COPD & seasonal allergies.  Past Medical History  Diagnosis Date  . COPD (chronic obstructive pulmonary disease)   . TIA (transient ischemic attack) 2010    A.) Carotid Duplex study, 10/08/2008, minimal disease  . Thyroid disease     HYPOTHYROID  . Chronic a-fib     A.) Cardioversion, Date: 08/16/2012, low tolerance B.) Echo, Date:09/06/2012, LV EF 60%-65%  . Obesity   . Chronic fatigue   . Chronic pain   . Hypothyroidism   . Dyspnea on exertion     A.) Lexican Mycardial Perfusion with Wall Motion LVEF, Date:09/07/2011, TID ratio 1.03, lung to heart ratio 26%.  . Seasonal allergies     Prior Cardiac Evaluation and Past Surgical History: Past Surgical History  Procedure Laterality Date  . Joint replacement  12/08/2006    LEFT HIP   . Total hip arthroplasty  [id number],2012  . Tonsillectomy    . Cardioversion N/A 08/16/2012    Procedure: CARDIOVERSION;  Surgeon: Marykay Lexavid W Udell Mazzocco, MD;  [facility];  Service: Endoscopy;  Laterality: N/A;  labs on arrival   . Cardioversion N/A 09/14/2012    Procedure: CARDIOVERSION;  Surgeon: Chrystie NoseKenneth C. Hilty, MD;  [facility];  Service: Cardiovascular;  Laterality: N/A;  . Transthoracic echocardiogram  09-2012    Moderate concentric LVH. EF 60-65%.; Mild LA dilation.  Marland Kitchen. Nm myoview ltd  April 2013      No evidence of ischemia or infarction. LOW RISK;  normal EF   Interval History:  presents today complaining of what she considers an intolerance to [place] XT -- she states that ever since she changed from diltiazem to [place], she isn't having significant hip and calf cramps. They're not notable when walking but usually when she's been sitting for a while. She is not a difficult time getting potassium he'll because of issues with formulary changes in her ability to take capsule but not tablets. She also didn't have coverage for potassium packets either. Cardiac standpoint, she really does not note any bowel A. fib, she doesn't notice it using a troponin with a sensation of rapid or irregular heartbeats. She does get her baseline exertional dyspnea but that is improved since he has lost about 17 pounds in the last 6 months. He states that a lot of this started back when she was put on some medicine that made her have vomiting diarrhea. She had been with medication-wise, the only medication that can think of would of been amiodarone. She has not been on amiodarone for quite some time now is only rate control with calcium channel blocker. She is anticoagulated with warfarin he denies any melena, hematochezia, hematuria or epistaxis. She denies any chest tightness pressure or dyspnea at rest or with normal levels of exertion. He denies any significant PND, orthopnea or  edema. Her allergies are actually doing relatively well now her breathing is stable as it has been for a while. She is sleeping better.    The leg discomfort she describes is not with walking or with motion, simply when rest. She does get significant cramps.   ROS: A comprehensive was performed. Review of Systems  Constitutional: Positive for weight loss.       Partially intentional, partially not.  HENT: Positive for congestion. Negative for nosebleeds and sore throat.   Respiratory: Positive for cough and wheezing. Negative for  hemoptysis, sputum production and shortness of breath.        Wheezing when she has allergies acting up.  Cardiovascular: Negative for claudication and leg swelling.  Gastrointestinal: Positive for heartburn. Negative for blood in stool and melena.       Takes OTC H2 blockers.  Genitourinary: Negative for hematuria.  Musculoskeletal:       She suffered a dog bite about a week ago and has a very significant bruising from it. Leg cramping in her hips and thighs as noted above.  Neurological: Negative for dizziness, sensory change, speech change, focal weakness, seizures and loss of consciousness.  Psychiatric/Behavioral: Negative for depression. The patient is not nervous/anxious.   All other systems reviewed and are negative.   Current Outpatient Prescriptions on File Prior to Visit  Medication Sig Dispense Refill  . aspirin 81 MG tablet Take 81 mg by mouth daily.        . furosemide (LASIX) 40 MG tablet Take 1 tablet (40 mg total) by mouth daily.  90 tablet  3  . levothyroxine (SYNTHROID, LEVOTHROID) 112 MCG tablet Take 1 tablet (112 mcg total) by mouth daily.  90 tablet  0  . warfarin (COUMADIN) 2.5 MG tablet Take 1 tablet (2.5 mg total) by mouth daily.  90 tablet  1   No current facility-administered medications on file prior to visit.   ALLERGIES REVIEWED IN EPIC -- No change SOCIAL AND FAMILY HISTORY REVIEWED IN EPIC -- No change  Wt Readings from Last 3 Encounters:  03/25/14 201 lb 14.4 oz (91.581 kg)  02/14/14 202 lb (91.627 kg)  11/13/13 213 lb (96.616 kg)  08/31/2013 218 lb which was 12 lb down from 04/2013 04/30/2013 230 lb  PHYSICAL EXAM BP 110/70  Pulse 85  Ht 5\' 3"  (1.6 m)  Wt 201 lb 14.4 oz (91.581 kg)  BMI 35.77 kg/m2  General appearance: alert, cooperative, appears stated age, no distress and moderately obese  HEENT: /AT, EOMI, MMM, anicteric sclera  Neck: no adenopathy, no carotid bruit and no JVD  Lungs: Mostly clear, with mild expiratory stridor and  wheezing; normal percussion bilaterally and non-labored  Heart: Irreg Irreg rhythm with normal rate, S1, S2 normal, 1/6 HSM at LLSB; no click, rub or gallop ; nondisplaced PMI  Abdomen: soft, non-tender; bowel sounds normal; no masses, no organomegaly; truncal obesity  Extremities: extremities normal, atraumatic, no cyanosis, and 1+ edema  Pulses: 2+ and symmetric;  Neurologic: Mental status: Alert, oriented, thought content appropriate; Cranial nerves: normal (II-XII grossly intact)   Adult ECG Report  Rate: 85 ;  Rhythm: atrial fibrillation with CVR. Cannot rule out Inferior MI, age undetermined  Narrative Interpretation: Stable EKG  Recent Labs:  None readily available at time of visit.  See Addendum below for labs checked following clinic today  ASSESSMENT / PLAN: Atrial fibrillation, chronic Stability. Rate controlled on calcium channel blocker with adequate rate control.. I do not think that her cramping  episodes are related to [place] mostly because I don't see it listed as a side effect. I think she may have some electrolyte abnormalities but also may just simply be needing to do more stretching and range of motion activities. Anticoagulated with warfarin.  Essential hypertension Well-controlled with calcium channel blocker only.  Obesity (BMI 35.0-39.9 without comorbidity) - 12lb weight loss from last visit. She continues to be dropping weight. She is now 29 pounds down from last year. She claims is because of her GI issues, but last October she was actually watching her dietary intake and trying to lose weight. With cramping, I think it's important for her to make sure that she is at least getting a steady diet, and staying adequately hydrated. We also think is very pertinent that she stays active and exercises with stretching exercises and walking.  Exertional shortness of breath Less noticeable today than has been in the past. Her allergies seem to be more stable. Rate is  improved. Hopefully as she continues to stabilize from a rate standpoint, his dyspnea will continue to improve.  Edema of both legs Stable with Lasix, however she needs to supplement her potassium. With her being on the diuretic I won't make sure we are stable with fascial level and using levels given her cramping.  Check CMP magnesium    Orders Placed This Encounter  Procedures  . Magnesium  . COMPLETE METABOLIC PANEL WITH GFR    Standing Status: Future     Number of Occurrences: 1     Standing Expiration Date: 03/26/2015  . EKG 12-Lead   Meds ordered this encounter  Medications  . CARTIA XT 240 MG 24 hr capsule    Sig: Take 1 capsule by mouth daily.  Marland Kitchen. DISCONTD: potassium chloride (K-DUR) 10 MEQ tablet    Sig: Take 1 tablets (20 mEq) by mouth four times daily.    Dispense:  120 tablet    Refill:  3    Last rx had wrong directions, please fill with smallest available tab/capsule as pt has difficulty in swallowing.  Please hold for now, pt to contact when ready to fill.    Followup: 6 MONTHS   Smita Lesh W, M.D., M.S. Interventional Cardiologist   Pager # [phone]   CMP     Component Value Date/Time   NA 140 03/25/2014 1636   K 3.9 03/25/2014 1636   CL 100 03/25/2014 1636   CO2 29 03/25/2014 1636   GLUCOSE 73 03/25/2014 1636   BUN 13 03/25/2014 1636   CREATININE 0.99 03/25/2014 1636   CREATININE 1.19* 09/14/2012 1235   CALCIUM 9.7 03/25/2014 1636   PROT 7.3 03/25/2014 1636   ALBUMIN 4.0 03/25/2014 1636   AST 19 03/25/2014 1636   ALT 14 03/25/2014 1636   ALKPHOS 83 03/25/2014 1636   BILITOT 0.5 03/25/2014 1636   GFRNONAA 57* 03/25/2014 1636   GFRNONAA 44* 09/14/2012 1235   GFRAA 65 03/25/2014 1636   GFRAA 52* 09/14/2012 1235   She will need to increase her magnesium intake. - Current level is 1.7.

## 2014-03-26 NOTE — Assessment & Plan Note (Signed)
She continues to be dropping weight. She is now 29 pounds down from last year. She claims is because of her GI issues, but last October she was actually watching her dietary intake and trying to lose weight. With cramping, I think it's important for her to make sure that she is at least getting a steady diet, and staying adequately hydrated. We also think is very pertinent that she stays active and exercises with stretching exercises and walking.

## 2014-03-26 NOTE — Assessment & Plan Note (Signed)
Stability. Rate controlled on calcium channel blocker with adequate rate control.. I do not think that her cramping episodes are related to Cartia mostly because I don't see it listed as a side effect. I think she may have some electrolyte abnormalities but also may just simply be needing to do more stretching and range of motion activities. Anticoagulated with warfarin.

## 2014-03-26 NOTE — Telephone Encounter (Signed)
Rx was sent to pharmacy electronically. 

## 2014-03-26 NOTE — Assessment & Plan Note (Addendum)
Stable with Lasix, however she needs to supplement her potassium. With her being on the diuretic I won't make sure we are stable with fascial level and using levels given her cramping.  Check CMP magnesium

## 2014-03-26 NOTE — Assessment & Plan Note (Addendum)
Less noticeable today than has been in the past. Her allergies seem to be more stable. Rate is improved. Hopefully as she continues to stabilize from a rate standpoint, his dyspnea will continue to improve.

## 2014-03-27 ENCOUNTER — Telehealth: Payer: Self-pay | Admitting: Cardiology

## 2014-03-27 ENCOUNTER — Telehealth: Payer: Self-pay | Admitting: *Deleted

## 2014-03-27 NOTE — Telephone Encounter (Signed)
Message copied by Tobin ChadMARTIN, Dajiah Kooi V. on Wed Mar 27, 2014  3:39 PM ------      Message from: Marykay LexHARDING, DAVID W      Created: Tue Mar 26, 2014 11:42 PM       Labs look pretty good - Potassium is a bit on the "low end of normal" - not low enough for cramps.  Magnesium is also on the "lowish" side.  I am reluctant to recommend Magnesium supplementation b/c these tend to promote loose stools.            Renal function is "normal" as well - stable.      Liver function is OK.            Marykay LexHARDING,DAVID W, M.D., M.S.      Interventional Cardiologist             Pager # [phone]                         ------

## 2014-03-27 NOTE — Telephone Encounter (Signed)
[facility] with [facility] staff. OK to refill #360 for 90 day supply >> sent to pharmacy yesterday

## 2014-03-27 NOTE — Telephone Encounter (Signed)
Reference#-[id number]  Would like to increase the Potassium to a 90 days supply,which would be #360.

## 2014-03-27 NOTE — Telephone Encounter (Signed)
Spoke to patient. Result given . Verbalized understanding  

## 2014-04-02 ENCOUNTER — Telehealth: Payer: Self-pay | Admitting: Cardiology

## 2014-04-02 MED ORDER — POTASSIUM CHLORIDE ER 10 MEQ PO CPCR: 10.0000 meq | ORAL_CAPSULE | Freq: Four times a day (QID) | ORAL | Status: DC

## 2014-04-02 NOTE — Telephone Encounter (Signed)
Rx was sent to pharmacy electronically for potassium capsules

## 2014-04-02 NOTE — Telephone Encounter (Signed)
[name] from [facility] is calling to get clarification on the Potassium Chloride. The prescription is sent in for the tablet form and she is unable to swallow those and they need a new prescription for the capsules . Please call at [phone] . Reference# [id number]  Thanks

## 2014-04-08 ENCOUNTER — Other Ambulatory Visit: Payer: Self-pay | Admitting: Cardiology

## 2014-04-08 LAB — PROTIME-INR
INR: 2.08 — ABNORMAL HIGH (ref <1.50)
Prothrombin Time: 23.4 seconds — ABNORMAL HIGH (ref 11.6–15.2)

## 2014-04-11 ENCOUNTER — Ambulatory Visit (INDEPENDENT_AMBULATORY_CARE_PROVIDER_SITE_OTHER): Payer: Medicare Other | Admitting: Pharmacist Clinician (PhC)/ Clinical Pharmacy Specialist

## 2014-04-17 ENCOUNTER — Telehealth: Payer: Self-pay | Admitting: Cardiology

## 2014-04-17 NOTE — Telephone Encounter (Signed)
Routed to Kristin/Dr. [name] to advise on supplements.. On warfarin

## 2014-04-17 NOTE — Telephone Encounter (Signed)
Pt called in wanting to know if it is ok for her to take B12 and Vitamin C. She says that you can leave the message on her answering machine if she does not answer

## 2014-04-17 NOTE — Telephone Encounter (Signed)
Returned call to patient - ok to take B12 and C.  Pt voiced understanding.

## 2014-04-19 ENCOUNTER — Telehealth: Payer: Self-pay | Admitting: Cardiology

## 2014-04-19 NOTE — Telephone Encounter (Signed)
States most recent Rx received for Potassium is the capsules but 2 weeks prior to that she got the tablets and is unable to swallow.  Pharmacy won't take them back and she is upset she is out the $15 and says the wrong pill form was sent to her mail order.  I apologized and suggested she break the pills up and put in apple sauce but states she was told not to do this.

## 2014-04-19 NOTE — Telephone Encounter (Signed)
Please call,concering her Potassium. She said the wrong Potassium was called in and she wants to be reimbursed.

## 2014-04-25 ENCOUNTER — Ambulatory Visit (INDEPENDENT_AMBULATORY_CARE_PROVIDER_SITE_OTHER): Payer: Medicare Other | Admitting: Family Medicine

## 2014-04-25 ENCOUNTER — Encounter: Payer: Self-pay | Admitting: Family Medicine

## 2014-04-25 VITALS — BP 126/80 | HR 70 | Wt 198.0 lb

## 2014-04-25 DIAGNOSIS — E039 Hypothyroidism, unspecified: Secondary | ICD-10-CM

## 2014-04-25 LAB — TSH: TSH: 0.036 u[IU]/mL — ABNORMAL LOW (ref 0.350–4.500)

## 2014-04-25 NOTE — Progress Notes (Signed)
   Subjective:    Patient ID: [name], female    DOB: August 13, 1940, 73 y.o.   MRN: [id number]  HPI She is here for recheck on her thyroid function. Her last TSH was low and her thyroid was readjusted  She is having no difficulty.   Review of Systems     Objective:   Physical Exam alert and in no distress otherwise not examined      Assessment & Plan:  Hypothyroidism, unspecified hypothyroidism type - Plan: TSH

## 2014-04-26 MED ORDER — LEVOTHYROXINE SODIUM 100 MCG PO TABS: 100.0000 ug | ORAL_TABLET | Freq: Every day | ORAL | Status: DC

## 2014-04-26 NOTE — Progress Notes (Signed)
   Subjective:    Patient ID: [name], female    DOB: 1941-04-30, 73 y.o.   MRN: [id number]  HPI    Review of Systems     Objective:   Physical Exam        Assessment & Plan:  TSH is still too low. I will readjust her Synthroid and recheck in 2 months

## 2014-04-26 NOTE — Addendum Note (Signed)
Addended by: Ronnald NianLALONDE, Medard Decuir C on: 04/26/2014 08:43 AM   Modules accepted: Orders

## 2014-04-29 ENCOUNTER — Other Ambulatory Visit: Payer: Self-pay

## 2014-04-29 DIAGNOSIS — R7989 Other specified abnormal findings of blood chemistry: Secondary | ICD-10-CM

## 2014-05-10 ENCOUNTER — Telehealth: Payer: Self-pay | Admitting: Pharmacist Clinician (PhC)/ Clinical Pharmacy Specialist

## 2014-05-10 NOTE — Telephone Encounter (Signed)
Pt concerned, got information from mail order pharmacy that several of her medications interact with each other.  Specifically levothyroxine and warfarin.    LMOM for patient that these are both monitored drugs, and she has nothing to worry about.

## 2014-05-13 ENCOUNTER — Other Ambulatory Visit: Payer: Self-pay | Admitting: Cardiology

## 2014-05-14 LAB — PROTIME-INR
INR: 2.05 — ABNORMAL HIGH (ref <1.50)
PROTHROMBIN TIME: 23.1 s — AB (ref 11.6–15.2)

## 2014-05-16 ENCOUNTER — Ambulatory Visit (INDEPENDENT_AMBULATORY_CARE_PROVIDER_SITE_OTHER): Payer: Medicare Other | Admitting: Pharmacist Clinician (PhC)/ Clinical Pharmacy Specialist

## 2014-06-10 ENCOUNTER — Other Ambulatory Visit: Payer: Self-pay | Admitting: Cardiology

## 2014-06-11 LAB — PROTIME-INR
INR: 1.77 — ABNORMAL HIGH (ref <1.50)
Prothrombin Time: 20.6 seconds — ABNORMAL HIGH (ref 11.6–15.2)

## 2014-06-12 ENCOUNTER — Ambulatory Visit (INDEPENDENT_AMBULATORY_CARE_PROVIDER_SITE_OTHER): Payer: Self-pay | Admitting: Pharmacist Clinician (PhC)/ Clinical Pharmacy Specialist

## 2014-06-18 ENCOUNTER — Telehealth: Payer: Self-pay | Admitting: Cardiology

## 2014-06-18 MED ORDER — WARFARIN SODIUM 2.5 MG PO TABS: 2.5000 mg | ORAL_TABLET | Freq: Every day | ORAL | Status: DC

## 2014-06-18 MED ORDER — DILTIAZEM HCL ER COATED BEADS 240 MG PO CP24: 240.0000 mg | ORAL_CAPSULE | Freq: Every day | ORAL | Status: DC

## 2014-06-18 NOTE — Telephone Encounter (Signed)
Pt called in stating that she needs a refill on her Cartia and Warfarin called in to [facility]. She also stated that since  She has changed insurances , she now has to pay $58 for her Micro- K. She would like to know if any samples are available for her to have. Please call  Thanks

## 2014-06-18 NOTE — Telephone Encounter (Signed)
Cartia & warfarin refills submitted. Called pt to inform, also informed we do not have samples of the requested potassium medication. Pt voiced understanding.

## 2014-07-01 ENCOUNTER — Other Ambulatory Visit: Payer: Self-pay

## 2014-07-03 ENCOUNTER — Other Ambulatory Visit: Payer: Medicare Other

## 2014-07-03 DIAGNOSIS — R7989 Other specified abnormal findings of blood chemistry: Secondary | ICD-10-CM

## 2014-07-03 LAB — TSH: TSH: 0.435 u[IU]/mL (ref 0.350–4.500)

## 2014-07-04 ENCOUNTER — Other Ambulatory Visit: Payer: Self-pay

## 2014-07-04 MED ORDER — LEVOTHYROXINE SODIUM 100 MCG PO TABS: 100.0000 ug | ORAL_TABLET | Freq: Every day | ORAL | Status: DC

## 2014-07-08 ENCOUNTER — Other Ambulatory Visit: Payer: Self-pay | Admitting: Cardiology

## 2014-07-08 LAB — PROTIME-INR
INR: 1.91 — ABNORMAL HIGH (ref <1.50)
PROTHROMBIN TIME: 21.9 s — AB (ref 11.6–15.2)

## 2014-07-10 ENCOUNTER — Ambulatory Visit (INDEPENDENT_AMBULATORY_CARE_PROVIDER_SITE_OTHER): Payer: Self-pay | Admitting: Pharmacist Clinician (PhC)/ Clinical Pharmacy Specialist

## 2014-07-15 ENCOUNTER — Telehealth: Payer: Self-pay | Admitting: Family Medicine

## 2014-07-16 NOTE — Telephone Encounter (Signed)
[employer] called to state that Tier exception was approved til 07/16/15 and they notified the pt

## 2014-08-12 ENCOUNTER — Other Ambulatory Visit: Payer: Self-pay | Admitting: Cardiology

## 2014-08-13 ENCOUNTER — Ambulatory Visit (INDEPENDENT_AMBULATORY_CARE_PROVIDER_SITE_OTHER): Payer: Self-pay | Admitting: Pharmacist Clinician (PhC)/ Clinical Pharmacy Specialist

## 2014-08-13 DIAGNOSIS — Z7901 Long term (current) use of anticoagulants: Secondary | ICD-10-CM

## 2014-08-13 LAB — PROTIME-INR
INR: 2.16 — AB (ref <1.50)
Prothrombin Time: 24.1 seconds — ABNORMAL HIGH (ref 11.6–15.2)

## 2014-09-11 ENCOUNTER — Other Ambulatory Visit: Payer: Self-pay | Admitting: Cardiology

## 2014-09-12 ENCOUNTER — Encounter: Payer: Self-pay | Admitting: Family Medicine

## 2014-09-12 ENCOUNTER — Ambulatory Visit (INDEPENDENT_AMBULATORY_CARE_PROVIDER_SITE_OTHER): Payer: Medicare Other | Admitting: Family Medicine

## 2014-09-12 VITALS — BP 124/72 | HR 76 | Wt 208.0 lb

## 2014-09-12 DIAGNOSIS — M2550 Pain in unspecified joint: Secondary | ICD-10-CM

## 2014-09-12 DIAGNOSIS — M791 Myalgia, unspecified site: Secondary | ICD-10-CM

## 2014-09-12 DIAGNOSIS — E038 Other specified hypothyroidism: Secondary | ICD-10-CM | POA: Diagnosis not present

## 2014-09-12 LAB — PROTIME-INR
INR: 1.98 — ABNORMAL HIGH (ref <1.50)
PROTHROMBIN TIME: 22.5 s — AB (ref 11.6–15.2)

## 2014-09-12 MED ORDER — THYROID 90 MG PO TABS: 90.0000 mg | ORAL_TABLET | Freq: Every day | ORAL | Status: DC

## 2014-09-12 NOTE — Patient Instructions (Signed)
You can use Claritin or Allegra for the sneezing itchy watery eyes when he knows. You can use Flonase for the nasal congestion and the other symptoms you can use Advil for headache

## 2014-09-12 NOTE — Progress Notes (Signed)
   Subjective:    Patient ID: [name], female    DOB: 07/27/1940, 74 y.o.   MRN: [id number]  HPI She complains of a 1-1/2 year history of difficulty with arthralgias and myalgias. She thinks this could be her thyroid dictation although she has been on this for 12 years. She would like to start on Armour Thyroid as she has read an article stating this might possibly help.   Review of Systems     Objective:   Physical Exam Alert and in no distress otherwise not examined. I did review blood work from October 2015 which was negative.       Assessment & Plan:  Other specified hypothyroidism - Plan: thyroid (ARMOUR THYROID) 90 MG tablet  Arthralgia  Myalgia I explained that it's probably not related to her thyroid medication but would try her on the Armour Thyroid. She is to return here in 2 months for recheck

## 2014-09-13 ENCOUNTER — Ambulatory Visit (INDEPENDENT_AMBULATORY_CARE_PROVIDER_SITE_OTHER): Payer: Self-pay | Admitting: Pharmacist Clinician (PhC)/ Clinical Pharmacy Specialist

## 2014-09-27 ENCOUNTER — Encounter: Payer: Self-pay | Admitting: Cardiology

## 2014-09-27 ENCOUNTER — Ambulatory Visit (INDEPENDENT_AMBULATORY_CARE_PROVIDER_SITE_OTHER): Payer: Medicare Other | Admitting: Cardiology

## 2014-09-27 VITALS — BP 118/66 | HR 94 | Ht 63.0 in | Wt 207.4 lb

## 2014-09-27 DIAGNOSIS — J302 Other seasonal allergic rhinitis: Secondary | ICD-10-CM

## 2014-09-27 DIAGNOSIS — I482 Chronic atrial fibrillation, unspecified: Secondary | ICD-10-CM

## 2014-09-27 DIAGNOSIS — I10 Essential (primary) hypertension: Secondary | ICD-10-CM

## 2014-09-27 DIAGNOSIS — Z7901 Long term (current) use of anticoagulants: Secondary | ICD-10-CM

## 2014-09-27 DIAGNOSIS — G458 Other transient cerebral ischemic attacks and related syndromes: Secondary | ICD-10-CM

## 2014-09-27 DIAGNOSIS — R600 Localized edema: Secondary | ICD-10-CM

## 2014-09-27 DIAGNOSIS — E669 Obesity, unspecified: Secondary | ICD-10-CM

## 2014-09-27 NOTE — Patient Instructions (Signed)
Your physician wants you to follow-up in: 6 months with Dr. Harding. You will receive a reminder letter in the mail two months in advance. If you don't receive a letter, please call our office to schedule the follow-up appointment.  

## 2014-09-27 NOTE — Progress Notes (Signed)
PCP: Carollee Herter, MD  Clinic Note: Chief Complaint  Patient presents with  . 6 month visit    c/o chest pain related to allergies; c/o SOB with some exertion  . Atrial Fibrillation    HPI: [name] is a 74 y.o. female with a PMH below who presents today for six-month followup of chronic atrial fibrillation.  Past Medical History  Diagnosis Date  . COPD (chronic obstructive pulmonary disease)   . TIA (transient ischemic attack) 2010    A.) Carotid Duplex study, 10/08/2008, minimal disease  . Thyroid disease     HYPOTHYROID  . Chronic a-fib     A.) Cardioversion, Date: 08/16/2012, low tolerance B.) Echo, Date:09/06/2012, LV EF 60%-65%  . Obesity   . Chronic fatigue   . Chronic pain   . Hypothyroidism   . Dyspnea on exertion     A.) Lexican Mycardial Perfusion with Wall Motion LVEF, Date:09/07/2011, TID ratio 1.03, lung to heart ratio 26%.  . Seasonal allergies     Prior Cardiac Evaluation and Past Surgical History: Past Surgical History  Procedure Laterality Date  . Joint replacement  12/08/2006    LEFT HIP   . Total hip arthroplasty  1610,9604  . Tonsillectomy    . Cardioversion N/A 08/16/2012    Procedure: CARDIOVERSION;  Surgeon: Marykay Lex, MD;  Location: [facility] ENDOSCOPY;  Service: Endoscopy;  Laterality: N/A;  labs on arrival   . Cardioversion N/A 09/14/2012    Procedure: CARDIOVERSION;  Surgeon: Chrystie Nose, MD;  Location: [facility]COPY;  Service: Cardiovascular;  Laterality: N/A;  . Transthoracic echocardiogram  09-2012    Moderate concentric LVH. EF 60-65%.; Mild LA dilation.  Marland Kitchen Nm myoview ltd  April 2013    No evidence of ischemia or infarction. LOW RISK;  normal EF    Interval History: Troubled most by allergies. Remains asymptomatic with Afib - no SSx of palipiations.  overall relatively well cardiac standpoint. Major complaints today are related to her allergies.edema has been well controlled. She thinks a lot of her shortness of breath is  related to her asthma and allergies as it is not associated with PND or orthopnea. She is having some chest discomfort from coughing but not at rest or with exertion otherwise.  Cardiovascular ROS: positive for - chest pain, dyspnea on exertion, shortness of breath and related to RAD / wheezing from allergies negative for - edema, irregular heartbeat, loss of consciousness, orthopnea, palpitations, paroxysmal nocturnal dyspnea, rapid heart rate or TIA/amaurosis fugax, syncope / near syncope. No claudication.  ROS: A comprehensive was performed. Review of Systems  Constitutional: Negative for malaise/fatigue.  HENT: Positive for congestion. Negative for nosebleeds.        Allergy / congestion  Respiratory: Positive for cough, sputum production and wheezing.   Cardiovascular: Negative for palpitations and claudication.  Gastrointestinal: Negative for blood in stool and melena.  Genitourinary: Negative for hematuria.  Musculoskeletal: Positive for joint pain.  Endo/Heme/Allergies: Does not bruise/bleed easily.  Psychiatric/Behavioral: Negative.   All other systems reviewed and are negative.   Current Outpatient Prescriptions on File Prior to Visit  Medication Sig Dispense Refill  . aspirin 81 MG tablet Take 81 mg by mouth daily.      Marland Kitchen diltiazem (CARTIA XT) 240 MG 24 hr capsule Take 1 capsule (240 mg total) by mouth daily. 90 capsule 2  . furosemide (LASIX) 40 MG tablet Take 1 tablet (40 mg total) by mouth daily. 90 tablet 3  . potassium chloride (MICRO-K)  10 MEQ CR capsule Take 1 capsule (10 mEq total) by mouth 4 (four) times daily. 360 capsule 1  . thyroid (ARMOUR THYROID) 90 MG tablet Take 1 tablet (90 mg total) by mouth daily. 30 tablet 1  . warfarin (COUMADIN) 2.5 MG tablet Take 1 tablet (2.5 mg total) by mouth daily. 90 tablet 0   No current facility-administered medications on file prior to visit.   Allergies  Allergen Reactions  . Amiodarone Diarrhea    History  Substance  Use Topics  . Smoking status: Former Smoker    Quit date: 06/07/2004  . Smokeless tobacco: Never Used  . Alcohol Use: No     Comment: occasionally   History reviewed. No pertinent family history.   Wt Readings from Last 3 Encounters:  09/27/14 207 lb 6.4 oz (94.076 kg)  09/12/14 208 lb (94.348 kg)  04/25/14 198 lb (89.812 kg)    PHYSICAL EXAM BP 118/66 mmHg  Pulse 94  Ht  (1.6 m)  Wt 207 lb 6.4 oz (94.076 kg)  BMI 36.75 kg/m2 General appearance: alert, cooperative, appears stated age, no distress and moderately obese  HEENT: [place]/AT, EOMI, MMM, anicteric sclera  Neck: no adenopathy, no carotid bruit and no JVD  Lungs: Mostly clear, with mild expiratory stridor and wheezing; normal percussion bilaterally and non-labored  Heart: Irreg Irreg rhythm with normal rate, S1, S2 normal, 1/6 HSM at LLSB; no click, rub or gallop ; nondisplaced PMI  Abdomen: soft, non-tender; bowel sounds normal; no masses, no organomegaly; truncal obesity  Extremities: extremities normal, atraumatic, no cyanosis, and 1+ edema  Pulses: 2+ and symmetric;  Neurologic: Mental status: Alert, oriented, thought content appropriate; Cranial nerves: normal (II-XII grossly intact) PSYCH: Normal mood & affect - in good humor.    Adult ECG Report  Rate: 94 ;  Rhythm: atrial fibrillation, premature atrial contractions (PAC) and Non-specific ST-T wave abnormalities  Narrative Interpretation: stable EKG  Recent Labs:  None available  ASSESSMENT / PLAN: Stable - no change  Problem List Items Addressed This Visit    Atrial fibrillation, chronic - Primary (Chronic)    Relatively stable without any active symptoms. Continue rate control with calcium channel blockers. She is doing better with current dose of Cartia. Anticoagulated with warfarin.      Edema of both legs (Chronic)    Stable with current dose of Lasix. Crampings his muscle problem which would suggest that her potassium levels are stable.   Continue supplementation.      Essential hypertension (Chronic)    Well controlled with calcium channel blocker.      Long term current use of anticoagulant therapy (Chronic)    INR check followup with Phillips Hay, RPH-CRP. No active bleeding      Obesity (BMI 35.0-39.9 without comorbidity) -  (Chronic)    Unfortunately she gained back 9 over 12 pounds she previously lost. This tends to happen during the spells when her reactive airways disease becomes a problem and she cannot exercise. We discussed the importance of dietary modification and continued on with exercise.      Seasonal allergies (Chronic)    Taking antihistamines.        TIA (transient ischemic attack) (Chronic)    No recent symptoms of TIA. Remains on warfarin plus aspirin. No active bleeding         No orders of the defined types were placed in this encounter.   Meds ordered this encounter  Medications  . latanoprost (XALATAN) 0.005 % ophthalmic solution  Sig: Place 1 drop into the left eye at bedtime.    Refill:  1     Followup: 6   HARDING, Piedad ClimesAVID W, M.D., M.S. Interventional Cardiologist   Pager # [phone]

## 2014-09-29 ENCOUNTER — Encounter: Payer: Self-pay | Admitting: Cardiology

## 2014-09-29 NOTE — Assessment & Plan Note (Signed)
No recent symptoms of TIA. Remains on warfarin plus aspirin. No active bleeding

## 2014-09-29 NOTE — Assessment & Plan Note (Signed)
Stable with current dose of Lasix. Crampings his muscle problem which would suggest that her potassium levels are stable.  Continue supplementation.

## 2014-09-29 NOTE — Assessment & Plan Note (Signed)
Well controlled with calcium channel blocker.

## 2014-09-29 NOTE — Assessment & Plan Note (Signed)
Unfortunately she gained back 9 over 12 pounds she previously lost. This tends to happen during the spells when her reactive airways disease becomes a problem and she cannot exercise. We discussed the importance of dietary modification and continued on with exercise.

## 2014-09-29 NOTE — Assessment & Plan Note (Signed)
INR check followup with [name], RPH-CRP. No active bleeding

## 2014-09-29 NOTE — Assessment & Plan Note (Signed)
Relatively stable without any active symptoms. Continue rate control with calcium channel blockers. She is doing better with current dose of Cartia. Anticoagulated with warfarin.

## 2014-09-29 NOTE — Assessment & Plan Note (Signed)
Taking antihistamines.

## 2014-10-09 ENCOUNTER — Other Ambulatory Visit: Payer: Self-pay | Admitting: Cardiology

## 2014-10-09 ENCOUNTER — Other Ambulatory Visit: Payer: Self-pay

## 2014-10-09 NOTE — Telephone Encounter (Signed)
Rx(s) sent to pharmacy electronically.  

## 2014-10-10 ENCOUNTER — Ambulatory Visit (INDEPENDENT_AMBULATORY_CARE_PROVIDER_SITE_OTHER): Payer: Medicare Other | Admitting: Pharmacist Clinician (PhC)/ Clinical Pharmacy Specialist

## 2014-10-10 DIAGNOSIS — Z7901 Long term (current) use of anticoagulants: Secondary | ICD-10-CM | POA: Insufficient documentation

## 2014-10-10 DIAGNOSIS — I482 Chronic atrial fibrillation, unspecified: Secondary | ICD-10-CM

## 2014-10-10 LAB — PROTIME-INR
INR: 2.37 — ABNORMAL HIGH (ref <1.50)
PROTHROMBIN TIME: 25.9 s — AB (ref 11.6–15.2)

## 2014-10-10 MED ORDER — WARFARIN SODIUM 2.5 MG PO TABS: 2.5000 mg | ORAL_TABLET | Freq: Every day | ORAL | Status: DC

## 2014-10-18 ENCOUNTER — Telehealth: Payer: Self-pay | Admitting: Cardiology

## 2014-10-18 ENCOUNTER — Telehealth: Payer: Self-pay | Admitting: *Deleted

## 2014-10-18 NOTE — Telephone Encounter (Signed)
RECEIVED A TIER EXCEPTION FORM FOR CARTIA XT  FORMED FILLED OUT AND FAXED TO [employer]  PATIENT HAS TIRED CARTIA 180 MG, FLECAINIDE- IN THE PAST. AWAITING FOR RESPONSE

## 2014-10-18 NOTE — Telephone Encounter (Signed)
[name] called in wanting to inform Dr. Elissa HeftyHarding's nurse that the pt's [name] was denied and the patient has been notified. Please f/u if need to  Thanks

## 2014-10-18 NOTE — Telephone Encounter (Signed)
New Message   Patient would like a call from the nurse in regards to medication. Please give patient a call back.

## 2014-10-18 NOTE — Telephone Encounter (Signed)
Left message on patient's home phone to call back

## 2014-10-21 NOTE — Telephone Encounter (Signed)
The tier acceptance have been approved.

## 2014-10-21 NOTE — Telephone Encounter (Signed)
Patient aware medication was approved for tier 1 price per patient

## 2014-11-05 ENCOUNTER — Telehealth: Payer: Self-pay | Admitting: Family Medicine

## 2014-11-05 DIAGNOSIS — R7989 Other specified abnormal findings of blood chemistry: Secondary | ICD-10-CM

## 2014-11-05 NOTE — Telephone Encounter (Signed)
Pt states she has to go to [facility] this week and have blood level checked for Warfarin.  Wants you to send order for thyroid to be tested at the same time & that way you will have the results by her appt on 6/7 with you.  [facility] t# [phone]

## 2014-11-05 NOTE — Telephone Encounter (Signed)
Take care of this 

## 2014-11-08 ENCOUNTER — Other Ambulatory Visit: Payer: Self-pay | Admitting: Cardiology

## 2014-11-08 ENCOUNTER — Other Ambulatory Visit: Payer: Self-pay | Admitting: Family Medicine

## 2014-11-09 LAB — TSH: TSH: 0.211 u[IU]/mL — AB (ref 0.350–4.500)

## 2014-11-09 LAB — PROTIME-INR
INR: 2.43 — ABNORMAL HIGH (ref <1.50)
Prothrombin Time: 26.4 seconds — ABNORMAL HIGH (ref 11.6–15.2)

## 2014-11-12 ENCOUNTER — Encounter: Payer: Self-pay | Admitting: Family Medicine

## 2014-11-12 ENCOUNTER — Ambulatory Visit (INDEPENDENT_AMBULATORY_CARE_PROVIDER_SITE_OTHER): Payer: Medicare Other | Admitting: Family Medicine

## 2014-11-12 VITALS — BP 130/80 | HR 70 | Wt 207.6 lb

## 2014-11-12 DIAGNOSIS — R7989 Other specified abnormal findings of blood chemistry: Secondary | ICD-10-CM | POA: Diagnosis not present

## 2014-11-12 MED ORDER — THYROID 65 MG PO TABS: 65.0000 mg | ORAL_TABLET | Freq: Every day | ORAL | Status: DC

## 2014-11-12 NOTE — Progress Notes (Signed)
   Subjective:    Patient ID: [name], female    DOB: 04-11-1941, 74 y.o.   MRN: [id number]  HPI She is here for follow-up visit. Approximately 2 months ago she was switched to Armour thyroid at her request. She thought that the other thyroid medication was causing leg cramps spray she does state that her cramps have improved. Recent blood work did show TSH to be slightly too low.   Review of Systems     Objective:   Physical Exam Alert and in no distress otherwise not examined       Assessment & Plan:  Abnormal TSH - Plan: thyroid (ARMOUR) 65 MG tablet I will lower her to this next lower dose on Armour Thyroid and recheck in 2 months. At the end of the encounter , she then mentions difficulty with tension headache and blames it on her longtime roommate. Apparently there are some tensions going on there. I recommended that they both come in to discuss how to resolve this rather than treat the tension headache. Explained that it be better to treat the cause of the headache rather than the headache.

## 2014-11-14 ENCOUNTER — Ambulatory Visit (INDEPENDENT_AMBULATORY_CARE_PROVIDER_SITE_OTHER): Payer: Medicare Other | Admitting: Pharmacist Clinician (PhC)/ Clinical Pharmacy Specialist

## 2014-11-14 DIAGNOSIS — I482 Chronic atrial fibrillation, unspecified: Secondary | ICD-10-CM

## 2014-11-14 DIAGNOSIS — Z7901 Long term (current) use of anticoagulants: Secondary | ICD-10-CM | POA: Diagnosis not present

## 2014-12-12 ENCOUNTER — Other Ambulatory Visit: Payer: Self-pay | Admitting: Cardiology

## 2014-12-13 LAB — PROTIME-INR
INR: 3.04 — ABNORMAL HIGH (ref <1.50)
Prothrombin Time: 31.5 seconds — ABNORMAL HIGH (ref 11.6–15.2)

## 2014-12-18 ENCOUNTER — Ambulatory Visit (INDEPENDENT_AMBULATORY_CARE_PROVIDER_SITE_OTHER): Payer: Medicare Other | Admitting: Pharmacist Clinician (PhC)/ Clinical Pharmacy Specialist

## 2014-12-18 DIAGNOSIS — Z7901 Long term (current) use of anticoagulants: Secondary | ICD-10-CM

## 2014-12-18 DIAGNOSIS — I482 Chronic atrial fibrillation, unspecified: Secondary | ICD-10-CM

## 2014-12-30 ENCOUNTER — Other Ambulatory Visit: Payer: Self-pay | Admitting: Cardiology

## 2014-12-31 ENCOUNTER — Ambulatory Visit (INDEPENDENT_AMBULATORY_CARE_PROVIDER_SITE_OTHER): Payer: Medicare Other | Admitting: Medical

## 2014-12-31 ENCOUNTER — Encounter: Payer: Self-pay | Admitting: Medical

## 2014-12-31 VITALS — BP 100/70 | HR 95 | Temp 98.1°F | Resp 14 | Wt 204.0 lb

## 2014-12-31 DIAGNOSIS — S00412A Abrasion of left ear, initial encounter: Secondary | ICD-10-CM | POA: Diagnosis not present

## 2014-12-31 DIAGNOSIS — H6122 Impacted cerumen, left ear: Secondary | ICD-10-CM

## 2014-12-31 DIAGNOSIS — H938X2 Other specified disorders of left ear: Secondary | ICD-10-CM | POA: Diagnosis not present

## 2014-12-31 NOTE — Progress Notes (Signed)
Subjective Here for few day hx/o left ear fullness, possible wax build up.  Has had this before similarly.  Has been swimming a lot, but no pain, no drainage, no swelling of ear, just fullness.  Can't hear out of left ear.  Has used qtip, got a little blood on qtip.  No other c/o.  ROS as in subjective   Objective: BP 100/70 mmHg  Pulse 95  Temp(Src) 98.1 F (36.7 C) (Oral)  Resp 14  Wt 204 lb (92.534 kg)  Gen: wd, wn, nad Ears: left ear canal with impacted cerumen, slight amount of bright red blood along inferior and posterior canal wall likely due to abrasion from qtip, right ear and canal and eardrum unremarkable hent otherwise unremarkable   Assessment: Encounter Diagnoses  Name Primary?  . Ear fullness, left Yes  . Impacted cerumen of left ear   . Abrasion of ear canal, left, initial encounter     Plan: Discussed findings.  Discussed risk/benefits of procedure and patient agrees to procedure. Successfully used warm water lavage to remove impacted cerumen from left ear canal. Patient tolerated procedure well. Advised they avoid using any cotton swabs or other devices to clean the ear canals.  Use basic hygiene as discussed.  Follow up prn.

## 2015-01-07 ENCOUNTER — Telehealth: Payer: Self-pay | Admitting: Family Medicine

## 2015-01-07 DIAGNOSIS — I10 Essential (primary) hypertension: Secondary | ICD-10-CM

## 2015-01-07 DIAGNOSIS — E669 Obesity, unspecified: Secondary | ICD-10-CM

## 2015-01-07 DIAGNOSIS — E038 Other specified hypothyroidism: Secondary | ICD-10-CM

## 2015-01-07 DIAGNOSIS — I482 Chronic atrial fibrillation, unspecified: Secondary | ICD-10-CM

## 2015-01-07 NOTE — Telephone Encounter (Signed)
Requesting that labs orders be sent to [facility] @ phone # [phone] so that she can have labwork done in next few days for her appointment on 01/13/15 with Dr Susann Givens

## 2015-01-07 NOTE — Telephone Encounter (Signed)
I put future orders in 

## 2015-01-08 ENCOUNTER — Telehealth: Payer: Self-pay | Admitting: Family Medicine

## 2015-01-08 NOTE — Telephone Encounter (Signed)
Pt states lab order still not at [facility], so I faxed orders to t# [phone]

## 2015-01-09 ENCOUNTER — Other Ambulatory Visit: Payer: Self-pay | Admitting: Cardiology

## 2015-01-09 ENCOUNTER — Other Ambulatory Visit: Payer: Self-pay | Admitting: Family Medicine

## 2015-01-09 LAB — COMPREHENSIVE METABOLIC PANEL
ALBUMIN: 3.8 g/dL (ref 3.6–5.1)
ALK PHOS: 66 U/L (ref 33–130)
ALT: 18 U/L (ref 6–29)
AST: 29 U/L (ref 10–35)
BUN: 11 mg/dL (ref 7–25)
CO2: 27 mmol/L (ref 20–31)
Calcium: 9.3 mg/dL (ref 8.6–10.4)
Chloride: 101 mmol/L (ref 98–110)
Creat: 0.88 mg/dL (ref 0.60–0.93)
Glucose, Bld: 96 mg/dL (ref 65–99)
Potassium: 3.2 mmol/L — ABNORMAL LOW (ref 3.5–5.3)
Sodium: 139 mmol/L (ref 135–146)
TOTAL PROTEIN: 7.2 g/dL (ref 6.1–8.1)
Total Bilirubin: 0.5 mg/dL (ref 0.2–1.2)

## 2015-01-09 LAB — CBC WITH DIFFERENTIAL/PLATELET
Basophils Absolute: 0 10*3/uL (ref 0.0–0.1)
Basophils Relative: 0 % (ref 0–1)
EOS ABS: 0.1 10*3/uL (ref 0.0–0.7)
Eosinophils Relative: 1 % (ref 0–5)
HCT: 39.3 % (ref 36.0–46.0)
Hemoglobin: 13.4 g/dL (ref 12.0–15.0)
LYMPHS ABS: 1.4 10*3/uL (ref 0.7–4.0)
LYMPHS PCT: 23 % (ref 12–46)
MCH: 30.7 pg (ref 26.0–34.0)
MCHC: 34.1 g/dL (ref 30.0–36.0)
MCV: 89.9 fL (ref 78.0–100.0)
MPV: 11.9 fL (ref 8.6–12.4)
Monocytes Absolute: 0.6 10*3/uL (ref 0.1–1.0)
Monocytes Relative: 9 % (ref 3–12)
Neutro Abs: 4.2 10*3/uL (ref 1.7–7.7)
Neutrophils Relative %: 67 % (ref 43–77)
PLATELETS: 292 10*3/uL (ref 150–400)
RBC: 4.37 MIL/uL (ref 3.87–5.11)
RDW: 13.8 % (ref 11.5–15.5)
WBC: 6.2 10*3/uL (ref 4.0–10.5)

## 2015-01-09 LAB — TSH: TSH: 2.173 u[IU]/mL (ref 0.350–4.500)

## 2015-01-10 LAB — PROTIME-INR
INR: 2.91 — ABNORMAL HIGH (ref <1.50)
PROTHROMBIN TIME: 30.4 s — AB (ref 11.6–15.2)

## 2015-01-13 ENCOUNTER — Encounter: Payer: Self-pay | Admitting: Family Medicine

## 2015-01-13 ENCOUNTER — Ambulatory Visit (INDEPENDENT_AMBULATORY_CARE_PROVIDER_SITE_OTHER): Payer: Medicare Other | Admitting: Family Medicine

## 2015-01-13 VITALS — BP 120/70 | HR 70 | Ht 63.0 in | Wt 204.0 lb

## 2015-01-13 DIAGNOSIS — Z7901 Long term (current) use of anticoagulants: Secondary | ICD-10-CM | POA: Diagnosis not present

## 2015-01-13 DIAGNOSIS — J302 Other seasonal allergic rhinitis: Secondary | ICD-10-CM

## 2015-01-13 DIAGNOSIS — Z23 Encounter for immunization: Secondary | ICD-10-CM | POA: Diagnosis not present

## 2015-01-13 DIAGNOSIS — G458 Other transient cerebral ischemic attacks and related syndromes: Secondary | ICD-10-CM | POA: Diagnosis not present

## 2015-01-13 DIAGNOSIS — E038 Other specified hypothyroidism: Secondary | ICD-10-CM

## 2015-01-13 DIAGNOSIS — I482 Chronic atrial fibrillation, unspecified: Secondary | ICD-10-CM

## 2015-01-13 DIAGNOSIS — E669 Obesity, unspecified: Secondary | ICD-10-CM

## 2015-01-13 DIAGNOSIS — I10 Essential (primary) hypertension: Secondary | ICD-10-CM

## 2015-01-13 MED ORDER — THYROID 65 MG PO TABS: 65.0000 mg | ORAL_TABLET | Freq: Every day | ORAL | Status: DC

## 2015-01-13 NOTE — Progress Notes (Signed)
   Subjective:    Patient ID: [name], female    DOB: May 27, 1941, 74 y.o.   MRN: [id number]  HPI    Review of Systems     Objective:   Physical Exam        Assessment & Plan:  Over 45 minutes spent with greater than 50% spent in counseling and coordination of care.

## 2015-01-13 NOTE — Progress Notes (Signed)
   Subjective:    Patient ID: [name], female    DOB: 10-16-1940, 74 y.o.   MRN: [id number]  HPI she is here for a medication management visit. She does have underlying atrial fibrillation and continues on Coumadin as well as Cartia. She is doing well on this. She does have underlying allergies and uses OTC medications for this. She has a remote history of TIA but presently is having no weakness, numbness, tingling. She is a former smoker. She does not exercise and does admit to being deconditioned. Her social and family history as well as health maintenance and immunizations were reviewed. She has no GI or GU issues.  Review of Systems  All other systems reviewed and are negative.      Objective:   Physical Exam Alert and in no distress. Tympanic membranes and canals are normal. Pharyngeal area is normal. Neck is supple without adenopathy or thyromegaly. Cardiac exam shows a regular sinus rhythm without murmurs or gallops. Lungs are clear to auscultation. Abdominal exam shows normal bowel sounds without masses or tenderness.       Assessment & Plan:  Other specified hypothyroidism - Plan: thyroid (ARMOUR) 65 MG tablet  Seasonal allergies  Atrial fibrillation, chronic  Long-term (current) use of anticoagulants  Long term current use of anticoagulant therapy  Other specified transient cerebral ischemias  Need for prophylactic vaccination against Streptococcus pneumoniae (pneumococcus) - Plan: Pneumococcal conjugate vaccine 13-valent  Essential hypertension  Obesity (BMI 35.0-39.9 without comorbidity) -   recommend she discuss possibly switching to a different anti-quite good with her cardiologist next visit. Continue to treat her allergies as needed. Encouraged her to become more physically active.

## 2015-01-14 ENCOUNTER — Telehealth: Payer: Self-pay | Admitting: Family Medicine

## 2015-01-14 ENCOUNTER — Other Ambulatory Visit: Payer: Self-pay

## 2015-01-14 MED ORDER — THYROID 60 MG PO TABS: 60.0000 mg | ORAL_TABLET | Freq: Every day | ORAL | Status: DC

## 2015-01-14 NOTE — Telephone Encounter (Signed)
i have canceled the  with [facility] mg [facility]

## 2015-01-14 NOTE — Telephone Encounter (Signed)
Clear this up with her as our record indicates 65. It is 60, then change it to the drugstore she would like.

## 2015-01-14 NOTE — Telephone Encounter (Signed)
Pt called questioning if she should be getting Armour 65mg  or <BADTE  but a script for  was sent in yesterday. Also this script should have gone to [facility] and not [facility]

## 2015-01-16 ENCOUNTER — Ambulatory Visit (INDEPENDENT_AMBULATORY_CARE_PROVIDER_SITE_OTHER): Payer: Medicare Other | Admitting: Pharmacist Clinician (PhC)/ Clinical Pharmacy Specialist

## 2015-01-16 DIAGNOSIS — I482 Chronic atrial fibrillation, unspecified: Secondary | ICD-10-CM

## 2015-01-16 DIAGNOSIS — Z7901 Long term (current) use of anticoagulants: Secondary | ICD-10-CM

## 2015-01-22 ENCOUNTER — Telehealth: Payer: Self-pay | Admitting: Cardiology

## 2015-01-27 NOTE — Telephone Encounter (Signed)
Close encounter 

## 2015-02-14 ENCOUNTER — Telehealth: Payer: Self-pay | Admitting: Physician Assistant

## 2015-02-14 ENCOUNTER — Other Ambulatory Visit: Payer: Self-pay | Admitting: Cardiology

## 2015-02-14 LAB — PROTIME-INR
INR: 3.04 — ABNORMAL HIGH (ref <1.50)
Prothrombin Time: 31.9 seconds — ABNORMAL HIGH (ref 11.6–15.2)

## 2015-02-14 NOTE — Telephone Encounter (Signed)
[facility] labs called after-hours line with PT/INR level. I do not really see any documentation of where this came from (no Coumadin clinic appt today) so I called patient at home. She says she gets this checked at [facility] on the first floor at the lab. ?Standing lab order. The level is then managed by Phillips Hay in Coumadin clinic. She is taking 2.5mg  daily. She recently noticed a bruise on her knee after kneeling on the sidewalk but otherwise denies any bleeding. I told her I wanted to speak with pharmacy @ [facility] to discuss possibly adjusting her dose since INR is marginally supratherapeutic but the patient refused. She did not want me to call them - she said she would rather this result go to [facility] to follow-up with on Monday. She understands there is an increased risk of bleeding with higher INR and accepts this risk. I asked her one more time and she affirmed her decision - said she would increase her intake of greens.              Will forward to [facility] for further management.  Arlene Genova PA-C

## 2015-02-17 ENCOUNTER — Ambulatory Visit (INDEPENDENT_AMBULATORY_CARE_PROVIDER_SITE_OTHER): Payer: Medicare Other | Admitting: Pharmacist Clinician (PhC)/ Clinical Pharmacy Specialist

## 2015-02-17 DIAGNOSIS — I482 Chronic atrial fibrillation, unspecified: Secondary | ICD-10-CM

## 2015-02-17 DIAGNOSIS — Z7901 Long term (current) use of anticoagulants: Secondary | ICD-10-CM

## 2015-03-13 ENCOUNTER — Other Ambulatory Visit: Payer: Self-pay | Admitting: Cardiology

## 2015-03-14 LAB — PROTIME-INR
INR: 2.7 — AB (ref <1.50)
Prothrombin Time: 29.1 seconds — ABNORMAL HIGH (ref 11.6–15.2)

## 2015-03-17 ENCOUNTER — Telehealth: Payer: Self-pay | Admitting: Pharmacist Clinician (PhC)/ Clinical Pharmacy Specialist

## 2015-03-17 ENCOUNTER — Ambulatory Visit (INDEPENDENT_AMBULATORY_CARE_PROVIDER_SITE_OTHER): Payer: Medicare Other | Admitting: Pharmacist

## 2015-03-17 DIAGNOSIS — Z7901 Long term (current) use of anticoagulants: Secondary | ICD-10-CM

## 2015-03-17 DIAGNOSIS — I482 Chronic atrial fibrillation, unspecified: Secondary | ICD-10-CM

## 2015-03-17 NOTE — Telephone Encounter (Signed)
Spoke with pt.  Gave her INR results and instructions.  See anti-coag note for details.

## 2015-03-17 NOTE — Telephone Encounter (Signed)
Patient needs her PT--INR results and coumadin instructions.

## 2015-04-03 ENCOUNTER — Ambulatory Visit (INDEPENDENT_AMBULATORY_CARE_PROVIDER_SITE_OTHER): Payer: Medicare Other | Admitting: Cardiology

## 2015-04-03 VITALS — BP 130/74 | HR 81 | Ht 63.0 in | Wt 205.4 lb

## 2015-04-03 DIAGNOSIS — E669 Obesity, unspecified: Secondary | ICD-10-CM | POA: Diagnosis not present

## 2015-04-03 DIAGNOSIS — I482 Chronic atrial fibrillation, unspecified: Secondary | ICD-10-CM

## 2015-04-03 DIAGNOSIS — Z7901 Long term (current) use of anticoagulants: Secondary | ICD-10-CM

## 2015-04-03 DIAGNOSIS — I10 Essential (primary) hypertension: Secondary | ICD-10-CM

## 2015-04-03 MED ORDER — WARFARIN SODIUM 2.5 MG PO TABS: 2.5000 mg | ORAL_TABLET | Freq: Every day | ORAL | Status: DC

## 2015-04-03 MED ORDER — DILTIAZEM HCL 60 MG PO TABS: 60.0000 mg | ORAL_TABLET | Freq: Four times a day (QID) | ORAL | Status: DC

## 2015-04-03 NOTE — Patient Instructions (Addendum)
STOP TAKING ASPIRIN  IF HEART RATE IS ABOVE 110, MAY USE DILTIAZEM 60 MG  MAY USE EVERY 6 HOURS IF NEEDED UP TO 4 TIMES- CALL OFFICE  Your physician wants you to follow-up in 6 month with Dr [name].  You will receive a reminder letter in the mail two months in advance. If you don't receive a letter, please call our office to schedule the follow-up appointment.  If you need a refill on your cardiac medications before your next appointment, please call your pharmacy.

## 2015-04-03 NOTE — Progress Notes (Signed)
PCP: Carollee Herter, MD  Clinic Note: Chief Complaint  Patient presents with  . Follow-up    6 mo//pt c/o SOB on exertion, chest pain from SOB  . Atrial Fibrillation    HPI: [name] is a 74 y.o. female with a PMH below who presents today for 6 month f/u of PAF (Chronic Afib).  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  [name] was last seen in April 2016. Was dealing with allergies.  Recent Hospitalizations: none  Studies Reviewed: none  Interval History: [name] presents for routine f/u without any major complaints besides easy bruising.  Still dealing with a lot of allergies, so she has been a bit more SOB than usual & has some chest tightness with wheezing. With the exception of the "asthma" related shortness of breath & chest tightness she has not had any anginal type of CP with rest or exertion.  No rapid / irregular heart beats to suggest symptomatic Afib.   No PND, orthopnea or edema. No palpitations, lightheadedness, dizziness, weakness or syncope/near syncope. No TIA/amaurosis fugax symptoms. No claudication.  ROS: A comprehensive was performed. Review of Systems  Constitutional: Negative for malaise/fatigue.  Cardiovascular: Negative for palpitations and claudication.  Gastrointestinal: Negative for blood in stool and melena.  Genitourinary: Negative for hematuria.  Musculoskeletal: Positive for joint pain. Negative for falls.  Neurological: Negative for dizziness and weakness.  Endo/Heme/Allergies: Bruises/bleeds easily.  All other systems reviewed and are negative.  Past Medical History  Diagnosis Date  . COPD (chronic obstructive pulmonary disease) (HCC)   . TIA (transient ischemic attack) 2010    A.) Carotid Duplex study, 10/08/2008, minimal disease  . Thyroid disease     HYPOTHYROID  . Chronic a-fib (HCC)     A.) Cardioversion, Date: 08/16/2012, low tolerance B.) Echo, Date:09/06/2012, LV EF 60%-65%  . Obesity   . Chronic fatigue   .  Chronic pain   . Hypothyroidism   . Dyspnea on exertion     A.) Lexican Mycardial Perfusion with Wall Motion LVEF, Date:09/07/2011, TID ratio 1.03, lung to heart ratio 26%.  . Seasonal allergies     Past Surgical History  Procedure Laterality Date  . Joint replacement  12/08/2006    LEFT HIP   . Total hip arthroplasty  1610,9604  . Tonsillectomy    . Cardioversion N/A 08/16/2012    Procedure: CARDIOVERSION;  Surgeon: Marykay Lex, MD;  Location: [facility] ENDOSCOPY;  Service: Endoscopy;  Laterality: N/A;  labs on arrival   . Cardioversion N/A 09/14/2012    Procedure: CARDIOVERSION;  Surgeon: Chrystie Nose, MD;  Location: [facility] ENDOSCOPY;  Service: Cardiovascular;  Laterality: N/A;  . Transthoracic echocardiogram  09-2012    Moderate concentric LVH. EF 60-65%.; Mild LA dilation.  Marland Kitchen Nm myoview ltd  April 2013    No evidence of ischemia or infarction. LOW RISK;  normal EF    Prior to Admission medications   Medication Sig Start Date End Date Taking? Authorizing Provider  aspirin 81 MG tablet Take 81 mg by mouth daily.     Yes Historical Provider, MD  diltiazem (CARTIA XT) 240 MG 24 hr capsule Take 1 capsule (240 mg total) by mouth daily. 06/18/14  Yes Marykay Lex, MD  furosemide (LASIX) 40 MG tablet TAKE 1 BY MOUTH DAILY 12/30/14  Yes Marykay Lex, MD  thyroid (ARMOUR) 60 MG tablet Take 1 tablet (60 mg total) by mouth daily before breakfast. 01/14/15  Yes Ronnald Nian, MD  warfarin (COUMADIN) 2.5 MG tablet Take 1 tablet (2.5 mg total) by mouth daily. 10/10/14  Yes Marykay Lexavid W Harding, MD   Allergies  Allergen Reactions  . Amiodarone Diarrhea    Social History   Social History  . Marital Status: Divorced    Spouse Name: N/A  . Number of Children: N/A  . Years of Education: N/A   Social History Main Topics  . Smoking status: Former Smoker    Quit date: 06/07/2004  . Smokeless tobacco: Never Used  . Alcohol Use: No     Comment: occasionally  . Drug Use: No  . Sexual Activity: Not  Currently   Other Topics Concern  . None   Social History Narrative   She is divorced, and is accompanied by her long-time companion/friend who is always with her. They are both transplants from [place] (she is from [place]   She is a former smoker, who quit in 2006. She claims to never have "inhaled."   She does not get routine exercise but she and her friend are very active.   History reviewed. No pertinent family history.  Wt Readings from Last 3 Encounters:  04/03/15 205 lb 6.4 oz (93.169 kg)  01/13/15 204 lb (92.534 kg)  12/31/14 204 lb (92.534 kg)    PHYSICAL EXAM BP 130/74 mmHg  Pulse 81  Ht 5\' 3"  (1.6 m)  Wt 205 lb 6.4 oz (93.169 kg)  BMI 36.39 kg/m2 General appearance: alert, cooperative, appears stated age, no distress and moderately obese  HEENT: [place]/AT, EOMI, MMM, anicteric sclera  Neck: no adenopathy, no carotid bruit and no JVD  Lungs: Mostly clear, with mild expiratory stridor and wheezing; normal percussion bilaterally and non-labored  Heart: Irreg Irreg rhythm with normal rate, S1, S2 normal, 1/6 HSM at LLSB; no click, rub or gallop ; nondisplaced PMI  Abdomen: soft, non-tender; bowel sounds normal; no masses, no organomegaly; truncal obesity  Extremities: extremities normal, atraumatic, no cyanosis, and 1+ edema  Pulses: 2+ and symmetric;  Neurologic: Mental status: Alert, oriented, thought content appropriate; Cranial nerves: normal (II-XII grossly intact) PSYCH: Normal mood & affect - in good humor.   Adult ECG Report  Rate: 81 ;  Rhythm: atrial fibrillation; Low voltage; otherwise normal axis, durations & intervals.  Narrative Interpretation: stable ECG   Other studies Reviewed: Additional studies/ records that were reviewed today include:  Recent Labs:  No new labs available   ASSESSMENT / PLAN: Problem List Items Addressed This Visit    Obesity (BMI 35.0-39.9 without comorbidity) -  (Chronic)    The patient understands the need to lose  weight with diet and exercise. We have discussed specific strategies for this.      Long term current use of anticoagulant therapy (Chronic)    Stable INR levels.   Can d/c ASA due to bruising      Relevant Orders   EKG 12-Lead   Essential hypertension (Chronic)    BP stable on current medications      Relevant Medications   warfarin (COUMADIN) 2.5 MG tablet   diltiazem (CARDIZEM) 60 MG tablet   Other Relevant Orders   EKG 12-Lead   Atrial fibrillation, chronic (HCC) - Primary (Chronic)    Relatively asymptomatic with adequate Rate Control on CCB. Anticoagulated with warfarin. This patients CHA2DS2-VASc Score and unadjusted Ischemic Stroke Rate (% per year) is equal to 4.8 % stroke rate/year from a score of 4  Above score calculated as 1 point each if present [CHF, HTN, DM,  Vascular=MI/PAD/Aortic Plaque, Age if 30-74, or Female] Above score calculated as 2 points each if present [Age > 75, or Stroke/TIA/TE]      Relevant Medications   warfarin (COUMADIN) 2.5 MG tablet   diltiazem (CARDIZEM) 60 MG tablet   Other Relevant Orders   EKG 12-Lead      Current medicines are reviewed at length with the patient today. (+/- concerns) bruising The following changes have been made:  STOP TAKING ASPIRIN  IF HEART RATE IS ABOVE 110, MAY USE DILTIAZEM 60 MG  MAY USE EVERY 6 HOURS IF NEEDED UP TO 4 TIMES- CALL OFFICE  Your physician wants you to follow-up in 6 month with Dr Herbie Baltimore. Studies Ordered:   Orders Placed This Encounter  Procedures  . EKG 12-Lead      Marykay Lex, M.D., M.S. Interventional Cardiologist   Pager # [phone]

## 2015-04-05 ENCOUNTER — Encounter: Payer: Self-pay | Admitting: Cardiology

## 2015-04-05 NOTE — Assessment & Plan Note (Addendum)
Relatively asymptomatic with adequate Rate Control on CCB. Anticoagulated with warfarin. This patients CHA2DS2-VASc Score and unadjusted Ischemic Stroke Rate (% per year) is equal to 4.8 % stroke rate/year from a score of 4  Above score calculated as 1 point each if present [CHF, HTN, DM, Vascular=MI/PAD/Aortic Plaque, Age if 65-74, or Female] Above score calculated as 2 points each if present [Age > 75, or Stroke/TIA/TE]

## 2015-04-05 NOTE — Assessment & Plan Note (Signed)
Stable INR levels.   Can d/c ASA due to bruising

## 2015-04-05 NOTE — Assessment & Plan Note (Signed)
BP stable on current medications.  

## 2015-04-05 NOTE — Assessment & Plan Note (Signed)
The patient understands the need to lose weight with diet and exercise. We have discussed specific strategies for this.  

## 2015-04-14 ENCOUNTER — Other Ambulatory Visit: Payer: Self-pay | Admitting: Cardiology

## 2015-04-15 LAB — PROTIME-INR
INR: 2.67 — AB (ref <1.50)
PROTHROMBIN TIME: 28.8 s — AB (ref 11.6–15.2)

## 2015-04-17 ENCOUNTER — Ambulatory Visit: Payer: Medicare Other | Admitting: Pharmacist Clinician (PhC)/ Clinical Pharmacy Specialist

## 2015-04-17 DIAGNOSIS — Z7901 Long term (current) use of anticoagulants: Secondary | ICD-10-CM

## 2015-04-17 DIAGNOSIS — I482 Chronic atrial fibrillation, unspecified: Secondary | ICD-10-CM

## 2015-05-09 ENCOUNTER — Telehealth: Payer: Self-pay

## 2015-05-09 MED ORDER — LEVOTHYROXINE SODIUM 100 MCG PO TABS: 100.0000 ug | ORAL_TABLET | Freq: Every day | ORAL | Status: DC

## 2015-05-09 NOTE — Telephone Encounter (Signed)
Wants to let you know she is switching from Armour to Levothyroxine. She said the Armour didn't so anything for her.

## 2015-05-14 ENCOUNTER — Other Ambulatory Visit: Payer: Self-pay | Admitting: Cardiology

## 2015-05-15 LAB — PROTIME-INR
INR: 2.45 — ABNORMAL HIGH (ref <1.50)
PROTHROMBIN TIME: 26.9 s — AB (ref 11.6–15.2)

## 2015-05-16 ENCOUNTER — Ambulatory Visit (INDEPENDENT_AMBULATORY_CARE_PROVIDER_SITE_OTHER): Payer: Medicare Other | Admitting: Pharmacist Clinician (PhC)/ Clinical Pharmacy Specialist

## 2015-05-16 DIAGNOSIS — I482 Chronic atrial fibrillation, unspecified: Secondary | ICD-10-CM

## 2015-05-16 DIAGNOSIS — Z7901 Long term (current) use of anticoagulants: Secondary | ICD-10-CM

## 2015-06-11 ENCOUNTER — Other Ambulatory Visit: Payer: Self-pay | Admitting: Cardiology

## 2015-06-12 ENCOUNTER — Telehealth: Payer: Self-pay | Admitting: Cardiology

## 2015-06-12 LAB — PROTIME-INR
INR: 2.41 — AB (ref <1.50)
PROTHROMBIN TIME: 26.6 s — AB (ref 11.6–15.2)

## 2015-06-12 NOTE — Telephone Encounter (Signed)
She said she need the name of the Dermatologist that Dr [name] recommended for her.She also wants to know if she can take a second fluid pill each day?Her legs and ankles are swelling a lot.

## 2015-06-12 NOTE — Telephone Encounter (Signed)
Left msg for patient to call. 

## 2015-06-12 NOTE — Telephone Encounter (Signed)
Pt states recently she has noticed some swelling in legs at night - she doesn't notice any shortness of breath with this and this usually resolves by morning. She currently takes 1 40mg  tab of lasix daily.  Pt also asking about dermatologist Dr. [name] recommended for a problem she was having when she last came in. She thinks the office is located on [address] and that it was a Dr. Derrill KayGoodman but she is unsure.  Advised she may be fine to double up on the lasix for 2-3 days but not to take 2nd dose any later than mid-afternoon (she goes to bed around 9:30pm). Should expect this issue to improve.  She states she currently drinks 2x V8 beverages a day for potassium supplementation.  She is aware I will defer to Dr. [name] for further recommendations.

## 2015-06-12 NOTE — Telephone Encounter (Signed)
Derm - Dr. Irene LimboGoodrich.  Would take lasix @ lunchtime instead of AM after 2-3 days of BID.  Marykay LexHARDING, DAVID W, MD

## 2015-06-12 NOTE — Telephone Encounter (Signed)
Spoke to patient. Recommendations given regarding medications and if new concerns to call. Contact info for Dr. Rene KocherGoodrich's office given, she voiced understanding.

## 2015-06-19 ENCOUNTER — Ambulatory Visit (INDEPENDENT_AMBULATORY_CARE_PROVIDER_SITE_OTHER): Payer: Medicare Other | Admitting: Pharmacist Clinician (PhC)/ Clinical Pharmacy Specialist

## 2015-06-19 DIAGNOSIS — I482 Chronic atrial fibrillation, unspecified: Secondary | ICD-10-CM

## 2015-06-19 DIAGNOSIS — Z7901 Long term (current) use of anticoagulants: Secondary | ICD-10-CM

## 2015-07-14 ENCOUNTER — Other Ambulatory Visit: Payer: Self-pay | Admitting: Cardiology

## 2015-07-15 ENCOUNTER — Ambulatory Visit (INDEPENDENT_AMBULATORY_CARE_PROVIDER_SITE_OTHER): Payer: Medicare Other | Admitting: Pharmacist Clinician (PhC)/ Clinical Pharmacy Specialist

## 2015-07-15 DIAGNOSIS — I482 Chronic atrial fibrillation, unspecified: Secondary | ICD-10-CM

## 2015-07-15 DIAGNOSIS — Z7901 Long term (current) use of anticoagulants: Secondary | ICD-10-CM

## 2015-07-15 LAB — PROTIME-INR
INR: 2.7 — ABNORMAL HIGH (ref <1.50)
Prothrombin Time: 29.1 seconds — ABNORMAL HIGH (ref 11.6–15.2)

## 2015-07-18 ENCOUNTER — Telehealth: Payer: Self-pay | Admitting: Cardiology

## 2015-07-18 NOTE — Telephone Encounter (Signed)
Patient would like to know if there is any interaction with her medications if she takes fish oil and gingko biloba

## 2015-07-18 NOTE — Telephone Encounter (Signed)
Should be fine.  Ginko can increase bruising/bleeding concerns for some with warfarin, but rarely affects INR.  However, for safety, if you choose to start ginko, please do so about 7-10 days prior to next INR so we can be sure there is no problems

## 2015-07-18 NOTE — Telephone Encounter (Signed)
Should be fine. Ginko can increase bruising/bleeding concerns for some with warfarin, but rarely affects INR. However, for safety, if you choose to start ginko, please do so about 7-10 days prior to next INR so we can be sure there is no problems   Patient given the above message.  Understands above message and thankful

## 2015-07-18 NOTE — Telephone Encounter (Signed)
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

## 2015-08-07 ENCOUNTER — Telehealth: Payer: Self-pay | Admitting: *Deleted

## 2015-08-07 NOTE — Telephone Encounter (Signed)
Mailed application for disability parking placard back to patient signed

## 2015-08-08 ENCOUNTER — Encounter: Payer: Self-pay | Admitting: Family Medicine

## 2015-08-11 ENCOUNTER — Other Ambulatory Visit: Payer: Self-pay | Admitting: Cardiology

## 2015-08-11 LAB — PROTIME-INR
INR: 2.79 — AB (ref <1.50)
Prothrombin Time: 29.8 seconds — ABNORMAL HIGH (ref 11.6–15.2)

## 2015-08-18 ENCOUNTER — Ambulatory Visit (INDEPENDENT_AMBULATORY_CARE_PROVIDER_SITE_OTHER): Payer: Medicare Other | Admitting: Pharmacist Clinician (PhC)/ Clinical Pharmacy Specialist

## 2015-08-18 DIAGNOSIS — Z7901 Long term (current) use of anticoagulants: Secondary | ICD-10-CM

## 2015-08-18 DIAGNOSIS — I482 Chronic atrial fibrillation, unspecified: Secondary | ICD-10-CM

## 2015-09-10 ENCOUNTER — Telehealth: Payer: Self-pay | Admitting: Family Medicine

## 2015-09-10 NOTE — Telephone Encounter (Signed)
Called and spoke to pt. She needs a CPE. Pt stated she would call back to make.

## 2015-09-12 ENCOUNTER — Other Ambulatory Visit: Payer: Self-pay | Admitting: Cardiology

## 2015-09-12 LAB — PROTIME-INR
INR: 2.98 — AB (ref <1.50)
PROTHROMBIN TIME: 31.4 s — AB (ref 11.6–15.2)

## 2015-09-16 ENCOUNTER — Ambulatory Visit (INDEPENDENT_AMBULATORY_CARE_PROVIDER_SITE_OTHER): Payer: Medicare Other | Admitting: Pharmacist Clinician (PhC)/ Clinical Pharmacy Specialist

## 2015-09-16 DIAGNOSIS — I482 Chronic atrial fibrillation, unspecified: Secondary | ICD-10-CM

## 2015-09-16 DIAGNOSIS — Z7901 Long term (current) use of anticoagulants: Secondary | ICD-10-CM

## 2015-10-06 ENCOUNTER — Ambulatory Visit (INDEPENDENT_AMBULATORY_CARE_PROVIDER_SITE_OTHER): Payer: Medicare Other | Admitting: Cardiology

## 2015-10-06 VITALS — BP 120/84 | HR 78 | Ht 63.0 in | Wt 186.0 lb

## 2015-10-06 DIAGNOSIS — E669 Obesity, unspecified: Secondary | ICD-10-CM

## 2015-10-06 DIAGNOSIS — I10 Essential (primary) hypertension: Secondary | ICD-10-CM

## 2015-10-06 DIAGNOSIS — Z7901 Long term (current) use of anticoagulants: Secondary | ICD-10-CM | POA: Diagnosis not present

## 2015-10-06 DIAGNOSIS — J302 Other seasonal allergic rhinitis: Secondary | ICD-10-CM

## 2015-10-06 DIAGNOSIS — I482 Chronic atrial fibrillation, unspecified: Secondary | ICD-10-CM

## 2015-10-06 NOTE — Progress Notes (Signed)
PCP: Carollee HerterLALONDE,Erika CHARLES, MD  Clinic Note: Chief Complaint  Patient presents with  . Follow-up    6 months--Chronic AFIB   pt states she always has chest tightness, occasional SOB--allergies, occasional dizziness, and occasional swelling  in legs/feet/ankles at night  . Atrial Fibrillation    HPI: [name] is a 75 y.o. female with a PMH below who presents today for six-month follow-up for chronic/EF with intermittent diastolic heart failure. She is on warfarin followed in our office.  [name] was last seen on 04/03/2015. Her major complaint was easy bruising. Also significant allergies. But otherwise doing okay from a cardiac standpoint. Asthma related dyspnea. No angina.  Recent Hospitalizations: None  Studies Reviewed: None  Interval History: Ms. [name] presents today - again notes SOB walking up the hill & mowing lawn -- coughing & wheezing from Pollen.  HR goes up if she gets angry @ her friend or the dogs.  She still has some tightness in her chest every now and then when wheezing or getting short of breath. This is usually really only relating to whence her asthma from allergies is acting up. She rarely has these symptoms otherwise. She has bruising up and down her legs when she carries heavy bags of Liver to the house. However when she is carrying these heavy bags, she denies any chest tightness or pressure or significant dyspnea.  No chest pain or shortness of breath with rest or exertion.  No PND, orthopnea or edema.  No sensation of palpitations, lightheadedness, dizziness, weakness or syncope/near syncope. No TIA/amaurosis fugax symptoms. No melena, hematochezia, hematuria, or epstaxis. No claudication.  ROS: A comprehensive was performed. Review of Systems  Constitutional: Positive for weight loss (Somewhat unexplained loss, because she does not note any improvement dietary habits. She has increased her exercise.).  HENT: Positive for congestion.    Related to pollen allergies  Eyes: Positive for redness (From pollen allergies).  Respiratory: Positive for cough and wheezing. Negative for sputum production (Just from postnasal drip).        Pollen exacerbating her reactive airways disease  Gastrointestinal: Positive for heartburn (Depending on what she eats).  Genitourinary: Negative for dysuria.  Musculoskeletal: Positive for joint pain (Routine arthritis pains). Negative for myalgias.  Neurological: Positive for dizziness (Balance issues. Is now walking with a cane). Negative for tingling, tremors, sensory change, speech change, focal weakness, seizures and loss of consciousness.  Endo/Heme/Allergies: Bruises/bleeds easily (On her arms and legs when carrying things).  Psychiatric/Behavioral: Negative for depression and memory loss. The patient is not nervous/anxious and does not have insomnia.   All other systems reviewed and are negative.    Past Medical History  Diagnosis Date  . COPD (chronic obstructive pulmonary disease) (HCC)   . TIA (transient ischemic attack) 2010    A.) Carotid Duplex study, 10/08/2008, minimal disease  . Thyroid disease     HYPOTHYROID  . Chronic a-fib (HCC)     A.) Cardioversion, Date: 08/16/2012, low tolerance B.) Echo, Date:09/06/2012, LV EF 60%-65%  . Obesity   . Chronic fatigue   . Chronic pain   . Hypothyroidism   . Dyspnea on exertion     A.) Lexican Mycardial Perfusion with Wall Motion LVEF, Date:09/07/2011, TID ratio 1.03, lung to heart ratio 26%.  . Seasonal allergies     Past Surgical History  Procedure Laterality Date  . Joint replacement  12/08/2006    LEFT HIP   . Total hip arthroplasty  [id number],2012  . Tonsillectomy    .  Cardioversion N/A 08/16/2012    Procedure: CARDIOVERSION;  Surgeon: Marykay Lex, MD;  Location: [facility] ENDOSCOPY;  Service: Endoscopy;  Laterality: N/A;  labs on arrival   . Cardioversion N/A 09/14/2012    Procedure: CARDIOVERSION;  Surgeon: Chrystie Nose, MD;   Location: [facility] ENDOSCOPY;  Service: Cardiovascular;  Laterality: N/A;  . Transthoracic echocardiogram  09-2012    Moderate concentric LVH. EF 60-65%.; Mild LA dilation.  Marland Kitchen Nm myoview ltd  April 2013    No evidence of ischemia or infarction. LOW RISK;  normal EF    Prior to Admission medications   Medication Sig Start Date End Date Taking? Authorizing Provider  diltiazem (CARDIZEM) 60 MG tablet Take 1 tablet (60 mg total) by mouth 4 (four) times daily. 04/03/15  Yes Marykay Lex, MD  furosemide (LASIX) 40 MG tablet Take 1 tablet (40 mg total) by mouth daily. 04/15/15  Yes Marykay Lex, MD  levothyroxine (SYNTHROID, LEVOTHROID) 100 MCG tablet Take 1 tablet (100 mcg total) by mouth daily. 05/09/15  Yes Ronnald Nian, MD  warfarin (COUMADIN) 2.5 MG tablet Take 1 tablet (2.5 mg total) by mouth daily. 04/03/15  Yes Marykay Lex, MD     Allergies  Allergen Reactions  . Amiodarone Diarrhea    Social History   Social History  . Marital Status: Divorced    Spouse Name: N/A  . Number of Children: N/A  . Years of Education: N/A   Social History Main Topics  . Smoking status: Former Smoker    Quit date: 06/07/2004  . Smokeless tobacco: Never Used  . Alcohol Use: No     Comment: occasionally  . Drug Use: No  . Sexual Activity: Not Currently   Other Topics Concern  . None   Social History Narrative   She is divorced, and is accompanied by her long-time companion/friend who is always with her. They are both transplants from [place] (she is from [place]).   She is a former smoker, who quit in 2006. She claims to never have "inhaled."   She does not get routine exercise but she and her friend are very active.   family history is not on file.   Wt Readings from Last 3 Encounters:  10/06/15 186 lb (84.369 kg)  04/03/15 205 lb 6.4 oz (93.169 kg)  01/13/15 204 lb (92.534 kg)    PHYSICAL EXAM BP 120/84 mmHg  Pulse 78  Ht 5\' 3"  (1.6 m)  Wt 186 lb (84.369 kg)  BMI 32.96  kg/m2 General appearance: alert, cooperative, appears stated age, no distress and moderately obese  HEENT: [place]/AT, EOMI, MMM, anicteric sclera  Neck: no adenopathy, no carotid bruit and no JVD  Lungs: Mostly clear, with mild expiratory stridor and wheezing; normal percussion bilaterally and non-labored  Heart: Irreg Irreg rhythm with normal rate, S1, S2 normal, 1/6 HSM at LLSB; no click, rub or gallop ; nondisplaced PMI  Abdomen: soft, non-tender; bowel sounds normal; no masses, no organomegaly; truncal obesity  Extremities: extremities normal, atraumatic, no cyanosis, and 1+ edema  Pulses: 2+ and symmetric;  Neurologic: Mental status: Alert, oriented, thought content appropriate; Cranial nerves: normal (II-XII grossly intact) PSYCH: Normal mood & affect - in good humor.    Adult ECG Report  Rate: 78 ;  Rhythm: atrial fibrillation; baseline low voltage. Poor anterior R-wave progression.  Narrative Interpretation: Stable from October 2016   Other studies Reviewed: Additional studies/ records that were reviewed today include:  Recent Labs: followed by PCP  ASSESSMENT / PLAN: Problem List Items Addressed This Visit    Seasonal allergies (Chronic)    This is right about the time that she start getting her allergies. She is convinced that her exertional dyspnea is related to that. If it continues to persist despite symptoms improving from an allergy standpoint, we may need to investigate further. Otherwise I'm inclined to believe her.      Obesity (BMI 35.0-39.9 without comorbidity) -  (Chronic)    Interestingly, despite not really adjusting her diet and doing any significant exercise, she has lost a significant amount of weight from just adjusting how much activity she does. We'll need to continue to monitor, because I would be concerned if her weight loss continues at this rate that she may have some component of underlying condition leading to it. She is not noticing any fatigue  or other symptoms to suggest any double malignancy however.      Relevant Orders   EKG 12-Lead   Long term current use of anticoagulant therapy (Chronic)    Continue to be on warfarin. Stable INR. No significant bleeding      Relevant Orders   EKG 12-Lead   Essential hypertension - Primary (Chronic)    Stable on current medications. No change      Relevant Orders   EKG 12-Lead   Atrial fibrillation, chronic (HCC) (Chronic)    Continues to be asymptomatic in persistent atrial fibrillation. On Cartia for rate control.   This patients CHA2DS2-VASc Score and unadjusted Ischemic Stroke Rate (% per year) is equal to 4.8 % stroke rate/year from a score of 4 --> On warfarin for anticoagulation. No bleeding       Relevant Orders   EKG 12-Lead      Current medicines are reviewed at length with the patient today. (+/- concerns) none The following changes have been made: none  Studies Ordered:   Orders Placed This Encounter  Procedures  . EKG 12-Lead    ROV 6 months   HARDING, Piedad Climes, M.D., M.S. Interventional Cardiologist   Pager # [phone] Phone # [phone] [address] [place], [place] [address]

## 2015-10-06 NOTE — Patient Instructions (Signed)
No change with current medications   Your physician wants you to follow-up in 6 months with Dr Harding.You will receive a reminder letter in the mail two months in advance. If you don't receive a letter, please call our office to schedule the follow-up appointment.   If you need a refill on your cardiac medications before your next appointment, please call your pharmacy.  

## 2015-10-08 ENCOUNTER — Encounter: Payer: Self-pay | Admitting: Cardiology

## 2015-10-09 NOTE — Assessment & Plan Note (Signed)
This is right about the time that she start getting her allergies. She is convinced that her exertional dyspnea is related to that. If it continues to persist despite symptoms improving from an allergy standpoint, we may need to investigate further. Otherwise I'm inclined to believe her.

## 2015-10-09 NOTE — Assessment & Plan Note (Addendum)
Continues to be asymptomatic in persistent atrial fibrillation. On Cartia for rate control.   This patients CHA2DS2-VASc Score and unadjusted Ischemic Stroke Rate (% per year) is equal to 4.8 % stroke rate/year from a score of 4 --> On warfarin for anticoagulation. No bleeding

## 2015-10-09 NOTE — Assessment & Plan Note (Signed)
Stable on current medications. No change 

## 2015-10-09 NOTE — Assessment & Plan Note (Signed)
Continue to be on warfarin. Stable INR. No significant bleeding

## 2015-10-09 NOTE — Assessment & Plan Note (Signed)
Interestingly, despite not really adjusting her diet and doing any significant exercise, she has lost a significant amount of weight from just adjusting how much activity she does. We'll need to continue to monitor, because I would be concerned if her weight loss continues at this rate that she may have some component of underlying condition leading to it. She is not noticing any fatigue or other symptoms to suggest any double malignancy however.

## 2015-10-10 ENCOUNTER — Other Ambulatory Visit: Payer: Self-pay | Admitting: Cardiology

## 2015-10-11 LAB — PROTIME-INR
INR: 2.19 — ABNORMAL HIGH (ref <1.50)
Prothrombin Time: 24.7 seconds — ABNORMAL HIGH (ref 11.6–15.2)

## 2015-10-17 ENCOUNTER — Ambulatory Visit (INDEPENDENT_AMBULATORY_CARE_PROVIDER_SITE_OTHER): Payer: Medicare Other | Admitting: Pharmacist Clinician (PhC)/ Clinical Pharmacy Specialist

## 2015-10-17 DIAGNOSIS — I482 Chronic atrial fibrillation, unspecified: Secondary | ICD-10-CM

## 2015-10-17 DIAGNOSIS — Z7901 Long term (current) use of anticoagulants: Secondary | ICD-10-CM

## 2015-10-28 ENCOUNTER — Other Ambulatory Visit: Payer: Self-pay

## 2015-10-28 ENCOUNTER — Telehealth: Payer: Self-pay | Admitting: Cardiology

## 2015-10-28 MED ORDER — WARFARIN SODIUM 2.5 MG PO TABS: 2.5000 mg | ORAL_TABLET | Freq: Every day | ORAL | Status: DC

## 2015-10-28 NOTE — Telephone Encounter (Signed)
New Message  Pt request a call back to discuss having her Wafarin RX refilled and sent to [facility] [place]. Please call

## 2015-10-28 NOTE — Telephone Encounter (Signed)
rx [facility] [place].  Pt aware

## 2015-10-28 NOTE — Telephone Encounter (Signed)
Patient called very rude and yelling stating that she wanted a refill on her Coumdin  In [place] but but not verify the place. syhe offered to give me the phone number but i told her i would still need to look the pharmacy up she said for me to just call her medication in and hung up.

## 2015-11-11 ENCOUNTER — Other Ambulatory Visit: Payer: Self-pay | Admitting: Cardiology

## 2015-11-12 ENCOUNTER — Ambulatory Visit (INDEPENDENT_AMBULATORY_CARE_PROVIDER_SITE_OTHER): Payer: Medicare Other | Admitting: Pharmacist Clinician (PhC)/ Clinical Pharmacy Specialist

## 2015-11-12 DIAGNOSIS — I482 Chronic atrial fibrillation, unspecified: Secondary | ICD-10-CM

## 2015-11-12 DIAGNOSIS — Z7901 Long term (current) use of anticoagulants: Secondary | ICD-10-CM

## 2015-11-12 LAB — PROTIME-INR
INR: 2.4 — AB (ref <1.50)
PROTHROMBIN TIME: 26.5 s — AB (ref 11.6–15.2)

## 2015-11-27 ENCOUNTER — Telehealth: Payer: Self-pay | Admitting: Cardiology

## 2015-11-27 MED ORDER — DILTIAZEM HCL ER COATED BEADS 240 MG PO CP24: 240.0000 mg | ORAL_CAPSULE | Freq: Every day | ORAL | Status: DC

## 2015-11-27 NOTE — Telephone Encounter (Signed)
Spoke to patient - she notes she was dosed up from 180mg  Cartia to 240mg  last visit.  Note: Reviewed meds w her, somehow this was taken off her list-- She notes the 60mg  pills she has listed are for PRN use  Informed her if she doesn't want to take the 240mg  generic, I could call in name-brand for local pharmacy pickup. Pt states "well, I'll only be out of town for 2-3 days" and stated she just wanted to take the leftover 180mg  tablets and use her 60mg  tablets PRN. Asked if she minded me having [name] reach out to her to discuss options, she states this would be fine.

## 2015-11-27 NOTE — Telephone Encounter (Signed)
I can't see where Dr. [name] increased her dose from 180 to 240 mg. If she was doing fine on the 180 with occasional 60 mg for breakthru issues, maybe should just leave her dose at 180.  Please confirm with Dr. [name].

## 2015-11-27 NOTE — Telephone Encounter (Signed)
The plan was to stay on 240 mg of the long acting Dilt with the 60 mg short acting as breakthrough.   If she is taking the additional 60mg  frequently, we can increase XT dose to 300 mg.  Bryan Lemmaavid Harding, MD

## 2015-11-27 NOTE — Telephone Encounter (Signed)
Spoke with patient, she is willing to try 240 mg again, but wanted to wait until she gets back from [place] next week.  She has enough 180 mg until then.  She states last time she took generic it caused severe diarrhea.  She will rechallenge at 240 mg once she is back in town next week.

## 2015-11-27 NOTE — Telephone Encounter (Signed)
Phone rings w/ no answer or VM pickup. 

## 2015-11-27 NOTE — Telephone Encounter (Signed)
Line busy when dialed. 

## 2015-11-27 NOTE — Telephone Encounter (Signed)
New message       Pt c/o medication issue:  1. Name of Medication: Cartia  2. How are you currently taking this medication (dosage and times per day)? 240 mg po  3. Are you having a reaction (difficulty breathing--STAT)? no  4. What is your medication issue? Pt is out of the medication and is leaving to go to [place] for the next 4[place]5 days, there was a generic brand called in for the medication, but the pt states the generic brand up sets the pt stomach, the pt has a old bottle is it OK to go ahead and take the Old bottle of the 180 mg po. Patient will be leaving soon to do errands.

## 2015-12-10 ENCOUNTER — Other Ambulatory Visit: Payer: Self-pay | Admitting: Cardiology

## 2015-12-10 LAB — PROTIME-INR
INR: 2.9 — ABNORMAL HIGH
Prothrombin Time: 29.6 s — ABNORMAL HIGH (ref 9.0–11.5)

## 2015-12-11 ENCOUNTER — Ambulatory Visit (INDEPENDENT_AMBULATORY_CARE_PROVIDER_SITE_OTHER): Payer: Medicare Other | Admitting: Pharmacist

## 2015-12-11 DIAGNOSIS — I482 Chronic atrial fibrillation, unspecified: Secondary | ICD-10-CM

## 2015-12-11 DIAGNOSIS — Z7901 Long term (current) use of anticoagulants: Secondary | ICD-10-CM

## 2016-01-05 ENCOUNTER — Telehealth: Payer: Self-pay | Admitting: Cardiology

## 2016-01-05 NOTE — Telephone Encounter (Signed)
New Message  Pt c/o medication issue:  1. Name of Medication: Cartia  2. How are you currently taking this medication (dosage and times per day)? Once a day  3. Are you having a reaction (difficulty breathing--STAT)? No  4. What is your medication issue? Pt is calling about a prescription she doesn't need until October and needs someone to call her back to clarify.

## 2016-01-05 NOTE — Telephone Encounter (Signed)
Call rings w/ no answer or VM pickup. Attempted x2.

## 2016-01-05 NOTE — Telephone Encounter (Deleted)
error 

## 2016-01-07 NOTE — Telephone Encounter (Signed)
Spoke with pt, she received cartia that she did not need and is wanting to know why. Advised looks like was placed after she and kristin the pharm md had talked. Patient voiced understanding

## 2016-01-07 NOTE — Telephone Encounter (Signed)
Left message for pt to call.

## 2016-01-08 ENCOUNTER — Other Ambulatory Visit: Payer: Self-pay | Admitting: Cardiology

## 2016-01-08 LAB — PROTIME-INR
INR: 2.3 — AB
PROTHROMBIN TIME: 23.4 s — AB (ref 9.0–11.5)

## 2016-01-13 ENCOUNTER — Ambulatory Visit (INDEPENDENT_AMBULATORY_CARE_PROVIDER_SITE_OTHER): Payer: Medicare Other | Admitting: Pharmacist Clinician (PhC)/ Clinical Pharmacy Specialist

## 2016-01-13 DIAGNOSIS — I482 Chronic atrial fibrillation, unspecified: Secondary | ICD-10-CM

## 2016-01-13 DIAGNOSIS — Z7901 Long term (current) use of anticoagulants: Secondary | ICD-10-CM

## 2016-02-03 ENCOUNTER — Telehealth: Payer: Self-pay | Admitting: Family Medicine

## 2016-02-03 NOTE — Telephone Encounter (Signed)
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 Pt has a book of preferred provided from her insurance company

## 2016-02-03 NOTE — Telephone Encounter (Signed)
Let her know that we can certainly refer her but have her come in here to make sure her ears are clean before referral to make sure that is not the main issue. L we can also do a screening hearing test

## 2016-02-05 NOTE — Telephone Encounter (Signed)
Left pt a message to please call and make appointment

## 2016-02-10 ENCOUNTER — Other Ambulatory Visit: Payer: Self-pay | Admitting: Cardiology

## 2016-02-11 LAB — PROTIME-INR
INR: 2.3 — ABNORMAL HIGH
PROTHROMBIN TIME: 23.2 s — AB (ref 9.0–11.5)

## 2016-02-20 ENCOUNTER — Ambulatory Visit (INDEPENDENT_AMBULATORY_CARE_PROVIDER_SITE_OTHER): Payer: Medicare Other | Admitting: Pharmacist Clinician (PhC)/ Clinical Pharmacy Specialist

## 2016-02-20 DIAGNOSIS — I482 Chronic atrial fibrillation, unspecified: Secondary | ICD-10-CM

## 2016-02-20 DIAGNOSIS — Z7901 Long term (current) use of anticoagulants: Secondary | ICD-10-CM

## 2016-03-03 ENCOUNTER — Other Ambulatory Visit: Payer: Self-pay

## 2016-03-03 MED ORDER — FUROSEMIDE 40 MG PO TABS: 40.0000 mg | ORAL_TABLET | Freq: Every day | ORAL | 2 refills | Status: DC

## 2016-03-09 ENCOUNTER — Other Ambulatory Visit: Payer: Self-pay | Admitting: Cardiology

## 2016-03-10 LAB — PROTIME-INR
INR: 2.5 — AB
PROTHROMBIN TIME: 25.9 s — AB (ref 9.0–11.5)

## 2016-03-17 ENCOUNTER — Ambulatory Visit (INDEPENDENT_AMBULATORY_CARE_PROVIDER_SITE_OTHER): Payer: Medicare Other | Admitting: Pharmacist

## 2016-03-17 DIAGNOSIS — I482 Chronic atrial fibrillation, unspecified: Secondary | ICD-10-CM

## 2016-03-17 DIAGNOSIS — Z7901 Long term (current) use of anticoagulants: Secondary | ICD-10-CM

## 2016-04-01 ENCOUNTER — Encounter: Payer: Self-pay | Admitting: *Deleted

## 2016-04-05 ENCOUNTER — Encounter: Payer: Self-pay | Admitting: Cardiology

## 2016-04-05 ENCOUNTER — Ambulatory Visit (INDEPENDENT_AMBULATORY_CARE_PROVIDER_SITE_OTHER): Payer: Medicare Other | Admitting: Cardiology

## 2016-04-05 VITALS — BP 124/85 | HR 82 | Ht 63.0 in | Wt 207.8 lb

## 2016-04-05 DIAGNOSIS — I482 Chronic atrial fibrillation, unspecified: Secondary | ICD-10-CM

## 2016-04-05 DIAGNOSIS — I10 Essential (primary) hypertension: Secondary | ICD-10-CM | POA: Diagnosis not present

## 2016-04-05 DIAGNOSIS — R600 Localized edema: Secondary | ICD-10-CM | POA: Diagnosis not present

## 2016-04-05 DIAGNOSIS — Z7901 Long term (current) use of anticoagulants: Secondary | ICD-10-CM | POA: Diagnosis not present

## 2016-04-05 DIAGNOSIS — E669 Obesity, unspecified: Secondary | ICD-10-CM

## 2016-04-05 NOTE — Assessment & Plan Note (Signed)
Well-controlled on only diltiazem.

## 2016-04-05 NOTE — Patient Instructions (Signed)
Your physician recommends that you schedule a follow-up appointment in: 6 MON[facility]   If you need a refill on your cardiac medications before your next appointment, please call your pharmacy.

## 2016-04-05 NOTE — Assessment & Plan Note (Signed)
Essentially persistent atrial fibrillation on Cardizem for rate control and warfarin for anticoagulation. No bleeding issues. Pretty much asymptomatic. No signs of bradycardia.  She may have some mild chronotropic incompetence on Beta blocker that is leading to some or dyspnea, but is probably more multifactorial.

## 2016-04-05 NOTE — Progress Notes (Signed)
PCP: Carollee HerterLALONDE,JOHN CHARLES, MD  Clinic Note: Chief Complaint  Patient presents with  . Shortness of Breath    pt c/o SOB   . Atrial Fibrillation    HPI: [name] is a 75 y.o. female with a PMH below who presents today for six-month follow-up for persistent atrial fibrillation and intermittent diastolic heart failure.  Erika CompJean E Sharp was last seen in May 2017. She noted some shortness of breath walking up hill mowing the lawn as well as coughing with her allergies. Otherwise was doing relatively well. She noted her heart rate increases when she was angry. Occasional chest tightness and wheezing.  Recent Hospitalizations: None  Studies Reviewed: No new studies  Interval History: [name] presents today overall doing quite well. She has her standard exertional dyspnea, but no worse or better than before. He really does not do much the way of any activity, she and her friend spend most of days sitting around playing cards and doing puzzles. She really is limited by arthritic pains in her back and knees. She has chronic allergies and these exacerbate her dyspnea symptoms as well. She really hasn't noticed any issues with A. fib however either rapid heart rates were slow heart rates. No syncope or near syncope type symptoms. No TIA or murmurs fugax symptoms. No melena, hematochezia hematuria. Hasn't not had to use the extra dose of diltiazem, stating that occasionally her are heart rate will get up into the 120 range, but will quickly dissipate when she stops doing whatever activity she is doing.   No chest pain or shortness of breath with rest or exertion.  No PND, orthopnea or edema. No  weakness or syncope/near syncope. No TIA/amaurosis fugax symptoms. No melena, hematochezia, hematuria, or epstaxis. No claudication.  She has definitely had 3 episodes of vertigo, but does note that her left ear is full of wax making her somewhat hard of hearing out of that ear currently.  ROS: A  comprehensive was performed. Review of Systems  Constitutional: Positive for malaise/fatigue (Mostly because she just doesn't do much). Negative for diaphoresis and weight loss (The current weights recorded are more accurate. She does not know how that weight loss happened. It was clearly an issue with scales.).  HENT: Positive for hearing loss (Left ears full of wax).   Eyes: Negative for blurred vision.  Respiratory: Positive for shortness of breath and wheezing (With allergies). Negative for cough.        Chronic lung condition with allergies.  Cardiovascular: Positive for leg swelling (Well-controlled with current dose of Lasix).  Gastrointestinal: Positive for heartburn. Negative for abdominal pain, blood in stool, constipation and melena.  Genitourinary: Negative for hematuria.  Musculoskeletal: Positive for back pain (Low back pain with sciatica pain), joint pain (Chronic knee pain) and myalgias (Legs ache whether they're in the frontal leg tobacco leg etc. with any Exertion.).  Neurological: Positive for dizziness (3 vertigo episodes.). Negative for loss of consciousness and weakness.  Endo/Heme/Allergies: Positive for environmental allergies.  Psychiatric/Behavioral: Negative for depression and memory loss. The patient is not nervous/anxious and does not have insomnia.     Past Medical History:  Diagnosis Date  . Atrial fibrillation, chronic (HCC): CHA2DS2Vasc = 4, on Warfarin; CCB for Rate control 05/01/2013   Previously on Flecainide - stopped due to recurrent Afib -- no longer effective. Did not tolerate Multaq.   This patients CHA2DS2-VASc Score and unadjusted Ischemic Stroke Rate (% per year) is equal to 4.8 % stroke rate/year from a  score of 4 (HTN, Female, h/o TIA)    . Chronic fatigue   . Chronic pain   . COPD (chronic obstructive pulmonary disease) (HCC)   . Dyspnea on exertion    A.) Lexican Mycardial Perfusion with Wall Motion LVEF, Date:09/07/2011, TID ratio 1.03, lung to  heart ratio 26%.  Marland Kitchen Hypothyroidism   . Obesity   . Seasonal allergies   . Thyroid disease    HYPOTHYROID  . TIA (transient ischemic attack) 2010   A.) Carotid Duplex study, 10/08/2008, minimal disease    Past Surgical History:  Procedure Laterality Date  . CARDIOVERSION N/A 08/16/2012   Procedure: CARDIOVERSION;  Surgeon: Marykay Lex, MD;  Location: [facility] ENDOSCOPY;  Service: Endoscopy;  Laterality: N/A;  labs on arrival   . CARDIOVERSION N/A 09/14/2012   Procedure: CARDIOVERSION;  Surgeon: Chrystie Nose, MD;  Location[facility] ENDOSCOPY;  Service: Cardiovascular;  Laterality: N/A;  . 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 NM MYOVIEW LTD  April 2013   No evidence of ischemia or infarction. LOW RISK;  normal EF  . TONSILLECTOMY    . TOTAL HIP ARTHROPLASTY  [id number]  . TRANSTHORACIC ECHOCARDIOGRAM  09-2012   Moderate concentric LVH. EF 60-65%.; Mild LA dilation.   Prior to Admission medications   Medication Sig Start Date End Date Taking? Authorizing Provider  diltiazem (CARDIZEM) 60 MG tablet Take 1 tablet (60 mg total) by mouth 4 (four) times daily. 04/03/15  Yes Marykay Lex, MD  diltiazem (CARTIA XT) 240 MG 24 hr capsule Take 1 capsule (240 mg total) by mouth daily. 11/27/15  Yes Marykay Lex, MD  furosemide (LASIX) 40 MG tablet Take 1 tablet (40 mg total) by mouth daily. 03/03/16  Yes Marykay Lex, MD  levothyroxine (SYNTHROID, LEVOTHROID) 100 MCG tablet Take 1 tablet (100 mcg total) by mouth daily. 05/09/15  Yes Ronnald Nian, MD  warfarin (COUMADIN) 2.5 MG tablet Take 1 tablet (2.5 mg total) by mouth daily. 10/28/15  Yes Marykay Lex, MD     Allergies  Allergen Reactions  . Amiodarone Diarrhea    Social History   Social History  . Marital status: Divorced    Spouse name: N/A  . Number of children: N/A  . Years of education: N/A   Social History Main Topics  . Smoking status: Former Smoker    Quit date: 06/07/2004  . Smokeless tobacco: Never Used  .  Alcohol use No     Comment: occasionally  . Drug use: No  . Sexual activity: Not Currently   Other Topics Concern  . None   Social History Narrative   She is divorced, and is accompanied by her long-time companion/friend who is always with her. They are both transplants from [place] (she is from [place]).   She is a former smoker, who quit in 2006. She claims to never have "inhaled."   She does not get routine exercise but she and her friend are very active.   History reviewed. No pertinent family history.  Wt Readings from Last 3 Encounters:  04/05/16 94.3 kg (207 lb 12.8 oz)  10/06/15 84.4 kg (186 lb)  04/03/15 93.2 kg (205 lb 6.4 oz)    PHYSICAL EXAM BP 124/85   Pulse 82   Ht 5\' 3"  (1.6 m)   Wt 94.3 kg (207 lb 12.8 oz)   BMI 36.81 kg/m  General appearance: alert, cooperative, appears stated age, no distress and moderately obese  HEENT: [place]/AT,  EOMI, MMM, anicteric sclera  Neck: no adenopathy, no carotid bruit and no JVD  Lungs: Mostly clear, with mild expiratory stridor and wheezing; normal percussion bilaterally and non-labored  Heart: Irreg Irreg rhythm with normal rate, S1, S2 normal, 1/6 HSM at LLSB; no click, rub or gallop ; nondisplaced PMI  Abdomen: soft, non-tender; bowel sounds normal; no masses, no organomegaly; truncal obesity  Extremities: extremities normal, atraumatic, no cyanosis, and 1+ edema  Pulses: 2+ and symmetric;  Neurologic: Mental status: Alert, oriented, thought content appropriate; Cranial nerves: normal (II-XII grossly intact) PSYCH: Normal mood & affect - in good humor.   Adult ECG Report Not checked  Other studies Reviewed: Additional studies/ records that were reviewed today include:  Recent Labs:  No recent labs available from PCP.    ASSESSMENT / PLAN: Problem List Items Addressed This Visit    Obesity (BMI 35.0-39.9 without comorbidity) -  (Chronic)    In addition to dietary modification, I recommend water walking/water  aerobics.  Otherwise she can talk to her orthopedic doctor about potentially being only uses stationary bike or even riding a bike on the road with her friend.      Long term current use of anticoagulant therapy - Primary (Chronic)   Essential hypertension (Chronic)    Well-controlled on only diltiazem.      Bilateral lower extremity edema (Chronic)    Pretty stable on current dose of Lasix. She still has cramps, but not as persistent. Continue potassium supplementation.      Atrial fibrillation, chronic (HCC): CHA2DS2Vasc = 4, on Warfarin; CCB for Rate control (Chronic)    Essentially persistent atrial fibrillation on Cardizem for rate control and warfarin for anticoagulation. No bleeding issues. Pretty much asymptomatic. No signs of bradycardia.  She may have some mild chronotropic incompetence on Beta blocker that is leading to some or dyspnea, but is probably more multifactorial.       Other Visit Diagnoses   None.     Current medicines are reviewed at length with the patient today. (+/- concerns) None The following changes have been made: None  Patient Instructions  Your physician recommends that you schedule a follow-up appointment in: 6 MONTHS [facility]   If you need a refill on your cardiac medications before your next appointment, please call your pharmacy.   Studies Ordered:   No orders of the defined types were placed in this encounter.     Bryan Lemmaavid Harding, M.D., M.S. Interventional Cardiologist   Pager # [phone] Phone # [phone] [address] [place], [place] [id number]

## 2016-04-05 NOTE — Assessment & Plan Note (Signed)
In addition to dietary modification, I recommend water walking/water aerobics.  Otherwise she can talk to her orthopedic doctor about potentially being only uses stationary bike or even riding a bike on the road with her friend.

## 2016-04-05 NOTE — Assessment & Plan Note (Signed)
Pretty stable on current dose of Lasix. She still has cramps, but not as persistent. Continue potassium supplementation.

## 2016-04-09 ENCOUNTER — Ambulatory Visit: Payer: Medicare Other | Admitting: Family Medicine

## 2016-04-12 ENCOUNTER — Encounter: Payer: Self-pay | Admitting: Family Medicine

## 2016-04-12 ENCOUNTER — Other Ambulatory Visit: Payer: Self-pay | Admitting: Cardiology

## 2016-04-12 ENCOUNTER — Ambulatory Visit (INDEPENDENT_AMBULATORY_CARE_PROVIDER_SITE_OTHER): Payer: Medicare Other | Admitting: Family Medicine

## 2016-04-12 VITALS — BP 110/70 | HR 76 | Resp 18 | Wt 205.6 lb

## 2016-04-12 DIAGNOSIS — H9190 Unspecified hearing loss, unspecified ear: Secondary | ICD-10-CM | POA: Diagnosis not present

## 2016-04-12 DIAGNOSIS — H6121 Impacted cerumen, right ear: Secondary | ICD-10-CM

## 2016-04-12 DIAGNOSIS — Z23 Encounter for immunization: Secondary | ICD-10-CM

## 2016-04-12 LAB — PROTIME-INR
INR: 1.9 — AB
PROTHROMBIN TIME: 19.4 s — AB (ref 9.0–11.5)

## 2016-04-12 NOTE — Progress Notes (Signed)
   Subjective:    Patient ID: [name], female    DOB: 1940/09/06, 75 y.o.   MRN: [id number]  HPI She is here for evaluation of difficulty with hearing especially from the left ear. She thinks she has a cerumen impaction. She's had no fever, chills, sore throat.   Review of Systems     Objective:   Physical Exam Alert and in no distress. Small amount of cerumen noted in the left canal, right canal was more impacted.       Assessment & Plan:  Need for prophylactic vaccination against Streptococcus pneumoniae (pneumococcus) - Plan: Pneumococcal polysaccharide vaccine 23-valent greater than or equal to 2yo subcutaneous/IM  Hearing loss, unspecified hearing loss type, unspecified laterality - Plan: Ambulatory referral to ENT  Impacted cerumen of right ear - Plan: Ambulatory referral to ENT The left canal was lavaged without difficulty. The TM appeared normal but she still is having difficulty with hearing. An attempt was made to lavage the right canal however she experienced a fair amount of pain. Because of this I will refer to ENT for further care of the impaction and also to evaluate her hearing loss.

## 2016-04-14 ENCOUNTER — Ambulatory Visit (INDEPENDENT_AMBULATORY_CARE_PROVIDER_SITE_OTHER): Payer: Medicare Other | Admitting: Pharmacist Clinician (PhC)/ Clinical Pharmacy Specialist

## 2016-04-14 DIAGNOSIS — I482 Chronic atrial fibrillation, unspecified: Secondary | ICD-10-CM

## 2016-04-14 DIAGNOSIS — Z7901 Long term (current) use of anticoagulants: Secondary | ICD-10-CM

## 2016-05-06 ENCOUNTER — Other Ambulatory Visit: Payer: Self-pay | Admitting: Pharmacist Clinician (PhC)/ Clinical Pharmacy Specialist

## 2016-05-06 MED ORDER — WARFARIN SODIUM 2.5 MG PO TABS: 2.5000 mg | ORAL_TABLET | Freq: Every day | ORAL | 1 refills | Status: DC

## 2016-05-12 ENCOUNTER — Other Ambulatory Visit: Payer: Self-pay | Admitting: Cardiology

## 2016-05-13 ENCOUNTER — Ambulatory Visit (INDEPENDENT_AMBULATORY_CARE_PROVIDER_SITE_OTHER): Payer: Medicare Other | Admitting: Pharmacist Clinician (PhC)/ Clinical Pharmacy Specialist

## 2016-05-13 ENCOUNTER — Telehealth: Payer: Self-pay | Admitting: Family Medicine

## 2016-05-13 DIAGNOSIS — I482 Chronic atrial fibrillation, unspecified: Secondary | ICD-10-CM

## 2016-05-13 DIAGNOSIS — Z7901 Long term (current) use of anticoagulants: Secondary | ICD-10-CM

## 2016-05-13 LAB — PROTIME-INR
INR: 2.3 — AB
PROTHROMBIN TIME: 23.2 s — AB (ref 9.0–11.5)

## 2016-05-13 NOTE — Telephone Encounter (Signed)
Called to ask about the flu shot but was not able to leave a message, on voicemail

## 2016-06-18 ENCOUNTER — Other Ambulatory Visit: Payer: Self-pay | Admitting: Cardiology

## 2016-06-18 DIAGNOSIS — I4891 Unspecified atrial fibrillation: Secondary | ICD-10-CM | POA: Diagnosis not present

## 2016-06-18 DIAGNOSIS — I482 Chronic atrial fibrillation: Secondary | ICD-10-CM | POA: Diagnosis not present

## 2016-06-18 LAB — PROTIME-INR
INR: 2.4 — AB
PROTHROMBIN TIME: 24.4 s — AB (ref 9.0–11.5)

## 2016-06-24 ENCOUNTER — Ambulatory Visit (INDEPENDENT_AMBULATORY_CARE_PROVIDER_SITE_OTHER): Payer: Medicare Other | Admitting: Pharmacist Clinician (PhC)/ Clinical Pharmacy Specialist

## 2016-06-24 DIAGNOSIS — I482 Chronic atrial fibrillation, unspecified: Secondary | ICD-10-CM

## 2016-06-24 DIAGNOSIS — Z7901 Long term (current) use of anticoagulants: Secondary | ICD-10-CM

## 2016-06-25 ENCOUNTER — Other Ambulatory Visit: Payer: Self-pay

## 2016-06-25 MED ORDER — FUROSEMIDE 40 MG PO TABS: 40.0000 mg | ORAL_TABLET | Freq: Every day | ORAL | 0 refills | Status: DC

## 2016-06-25 MED ORDER — DILTIAZEM HCL ER COATED BEADS 240 MG PO CP24: 240.0000 mg | ORAL_CAPSULE | Freq: Every day | ORAL | 0 refills | Status: DC

## 2016-06-25 MED ORDER — WARFARIN SODIUM 2.5 MG PO TABS: 2.5000 mg | ORAL_TABLET | Freq: Every day | ORAL | 1 refills | Status: DC

## 2016-06-30 ENCOUNTER — Other Ambulatory Visit: Payer: Self-pay

## 2016-06-30 ENCOUNTER — Other Ambulatory Visit: Payer: Self-pay | Admitting: Cardiology

## 2016-06-30 MED ORDER — FUROSEMIDE 40 MG PO TABS: 40.0000 mg | ORAL_TABLET | Freq: Every day | ORAL | 2 refills | Status: DC

## 2016-06-30 MED ORDER — DILTIAZEM HCL 60 MG PO TABS: 60.0000 mg | ORAL_TABLET | Freq: Four times a day (QID) | ORAL | 1 refills | Status: DC

## 2016-06-30 MED ORDER — DILTIAZEM HCL ER COATED BEADS 240 MG PO CP24: 240.0000 mg | ORAL_CAPSULE | Freq: Every day | ORAL | 2 refills | Status: DC

## 2016-06-30 NOTE — Telephone Encounter (Signed)
New message   [facility] is calling for refills Reference number   [id number]  *STAT* If patient is at the pharmacy, call can be transferred to refill team.   1. Which medications need to be refilled? (please list name of each medication and dose if known) Flurosemide and Diltiazem 2. Which pharmacy/location (including street and city if local pharmacy) is medication to be sent to? Mail order  3. Do they need a 30 day or 90 day supply? Unknown depends on doctor

## 2016-06-30 NOTE — Telephone Encounter (Signed)
Diltiazem 240mg  and Lasix 40mg  [facility] to [facility] with 0 refills  Updated Rx sent in with refills

## 2016-07-05 ENCOUNTER — Telehealth: Payer: Self-pay | Admitting: Family Medicine

## 2016-07-05 MED ORDER — LEVOTHYROXINE SODIUM 100 MCG PO TABS: 100.0000 ug | ORAL_TABLET | Freq: Every day | ORAL | 2 refills | Status: DC

## 2016-07-05 NOTE — Telephone Encounter (Signed)
Is this okay to refill? 

## 2016-07-05 NOTE — Telephone Encounter (Signed)
Pt called advising [facility] has requested refill to us for Levothryoxine and why aren't we refilling.  I explained to pt we have not received a request from [facility] however I would send it back.

## 2016-07-12 ENCOUNTER — Telehealth: Payer: Self-pay | Admitting: Cardiology

## 2016-07-12 NOTE — Telephone Encounter (Signed)
Ackowledged.  [facility] pharmacy deleted from pharmacies on file.  [facility] now preferred pharmacy.

## 2016-07-12 NOTE — Telephone Encounter (Signed)
New message    Pt calling saying that she received a letter from [facility] saying her precriptions were going to be filled. She states she is no longer with [facility] or [employer]. She states she now has [employer] and uses [facility]. She just wanted to let someone know, she has already has her meds.

## 2016-07-19 ENCOUNTER — Other Ambulatory Visit: Payer: Self-pay | Admitting: Cardiology

## 2016-07-19 DIAGNOSIS — I482 Chronic atrial fibrillation: Secondary | ICD-10-CM | POA: Diagnosis not present

## 2016-07-19 DIAGNOSIS — I4891 Unspecified atrial fibrillation: Secondary | ICD-10-CM | POA: Diagnosis not present

## 2016-07-20 LAB — PROTIME-INR
INR: 2.8 — ABNORMAL HIGH
Prothrombin Time: 28.8 s — ABNORMAL HIGH (ref 9.0–11.5)

## 2016-07-22 ENCOUNTER — Ambulatory Visit (INDEPENDENT_AMBULATORY_CARE_PROVIDER_SITE_OTHER): Payer: Medicare Other | Admitting: Pharmacist Clinician (PhC)/ Clinical Pharmacy Specialist

## 2016-07-22 DIAGNOSIS — Z7901 Long term (current) use of anticoagulants: Secondary | ICD-10-CM

## 2016-07-22 DIAGNOSIS — I482 Chronic atrial fibrillation, unspecified: Secondary | ICD-10-CM

## 2016-08-18 ENCOUNTER — Other Ambulatory Visit: Payer: Self-pay | Admitting: Cardiology

## 2016-08-18 DIAGNOSIS — I4891 Unspecified atrial fibrillation: Secondary | ICD-10-CM | POA: Diagnosis not present

## 2016-08-18 DIAGNOSIS — I482 Chronic atrial fibrillation: Secondary | ICD-10-CM | POA: Diagnosis not present

## 2016-08-19 LAB — PROTIME-INR
INR: 2.8 — AB
PROTHROMBIN TIME: 28.8 s — AB (ref 9.0–11.5)

## 2016-08-30 ENCOUNTER — Ambulatory Visit (INDEPENDENT_AMBULATORY_CARE_PROVIDER_SITE_OTHER): Payer: Medicare Other | Admitting: Pharmacist Clinician (PhC)/ Clinical Pharmacy Specialist

## 2016-08-30 DIAGNOSIS — I482 Chronic atrial fibrillation, unspecified: Secondary | ICD-10-CM

## 2016-08-30 DIAGNOSIS — Z7901 Long term (current) use of anticoagulants: Secondary | ICD-10-CM

## 2016-09-14 ENCOUNTER — Ambulatory Visit (INDEPENDENT_AMBULATORY_CARE_PROVIDER_SITE_OTHER): Payer: Medicare Other | Admitting: Cardiology

## 2016-09-14 ENCOUNTER — Encounter: Payer: Self-pay | Admitting: Cardiology

## 2016-09-14 ENCOUNTER — Other Ambulatory Visit: Payer: Self-pay | Admitting: Cardiology

## 2016-09-14 VITALS — BP 134/72 | HR 94 | Ht 63.0 in | Wt 202.6 lb

## 2016-09-14 DIAGNOSIS — R600 Localized edema: Secondary | ICD-10-CM

## 2016-09-14 DIAGNOSIS — I482 Chronic atrial fibrillation, unspecified: Secondary | ICD-10-CM

## 2016-09-14 DIAGNOSIS — I10 Essential (primary) hypertension: Secondary | ICD-10-CM

## 2016-09-14 DIAGNOSIS — E669 Obesity, unspecified: Secondary | ICD-10-CM | POA: Diagnosis not present

## 2016-09-14 DIAGNOSIS — I4891 Unspecified atrial fibrillation: Secondary | ICD-10-CM | POA: Diagnosis not present

## 2016-09-14 LAB — PROTIME-INR
INR: 3.1 — AB
PROTHROMBIN TIME: 30.9 s — AB (ref 9.0–11.5)

## 2016-09-14 NOTE — Patient Instructions (Signed)
No change with medications   Your physician wants you to follow-up in 6 months with Dr Herbie Baltimore.You will receive a reminder letter in the mail two months in advance. If you don't receive a letter, please call our office to schedule the follow-up appointment.   If you need a refill on your cardiac medications before your next appointment, please call your pharmacy.

## 2016-09-14 NOTE — Progress Notes (Signed)
PCP: Carollee Herter, MD  Clinic Note: Chief Complaint  Patient presents with  . Chest Pain    pt sttes some tightness   . Shortness of Breath    some   . Follow-up    states getting vertigo     HPI: [name] is a 76 y.o. female with a PMH below who presents today for six-month follow-up for chronic persistent A. Fib (CHA2DS2Vasc = 4, warfarin) as well as chronic diastolic dysfunction with intermittent diastolic heart failure. She filled with in therapy and is now on rate control with standing dose of diltiazem as well as when necessary.  [name] was last seen on October 2017, she is doing fairly well with her standard dyspnea. He now is that she really doesn't do much.  Recent Hospitalizations: None  Studies Reviewed: None  Interval History: [name] presents today for her routine follow-up really without any cardiac symptoms. The biggest thing for her today was a cathartic episode below discuss issues that are happening at the house. She doesn't notice that she is in A. fib and has no symptoms is suggested. She still has dyspnea but really attributes it more to allergies like she is has the past. She is not noticing that is a different that has been going on for the past. No PND, orthopnea. She does have chronic congestion of her sinuses noticing throat. She has chronic mild cough. No PND, orthopnea or edema. No sensation of rapid irregular heartbeats or palpitations even though she is in A. fib. No syncope/near syncope, or TIA/amaurosis fugax.   No melena, hematochezia, hematuria, or epstaxis. No claudication.  I learned a lot about [name] in her life today. The majority of this visit which was close to an hour long was spent allowing her to prevent her frustrations. Apparently her roommate who usually accompanies her on visits has been having worsening symptoms of dementia and is now becoming quite cult to manage. Although [name] is acting as the caregiver, she is having a  hard time dealing with her friend's bursts of anger and uncontrolled emotion. There is been discussion of her being kicked out. Essentially Dhiya is living in the house with her friend that is owned by her friend. They moved down together from [place] where she had moved in to the guest apartment in the friend's house that had been the mother's apartment. At that time they were working together and it was a arrangement as they were friends and [name] does not have a license to drive. They were together to work, and [name] was helping pay the mortgage by paying rent.  When her friend decided to move down to [place], [name] came along because she was somewhat unsure what to do for housing especially up in [place] where she was not able to afford housing on her own. She now feels trapped in the house with her friend that she is unable to go out and do things for herself mostly because she is scared to leave her friend by herself. But she also does not have a driver's license. She has a niece in [place] who is helping her figure out how to get low rent housing there and live near her, but [name] is still worried about leaving the current house and her friend in the predicament that she is in. She is also concerned about the dog.  All of these discussions took quite a long time, but I think having the discussion helped  her feel better and was a very important release of frustration. Mostly because I did not have a patient scheduled after her, I was free to spend the time with her. Although simply providing emotional support and no medical decision making, I think the visit was a successful visit.  ROS: A comprehensive was performed. Review of Systems  Constitutional: Positive for malaise/fatigue (Really just hasn't been getting out and do things).  HENT: Positive for congestion.   Respiratory: Positive for cough, shortness of breath and wheezing.        Related to chronic sinus congestion.  Cardiovascular:  Positive for leg swelling (Mild at the end of the day).  Musculoskeletal: Positive for back pain (Still has some sciatic pain), falls (She tripped and fell a little while ago and hurt herself.), joint pain (Chronic knee pain) and myalgias (Chronic).  Endo/Heme/Allergies: Positive for environmental allergies.  Psychiatric/Behavioral: Negative for memory loss. The patient has insomnia (She has a hard time getting to sleep and then if she ever wakes up in the night to go to the bathroom, she will not go back to sleep).        See history of present illness about her frustrations.    Past Medical History:  Diagnosis Date  . Atrial fibrillation, chronic (HCC): CHA2DS2Vasc = 4, on Warfarin; CCB for Rate control 05/01/2013   Previously on Flecainide - stopped due to recurrent Afib -- no longer effective. Did not tolerate Multaq.   This patients CHA2DS2-VASc Score and unadjusted Ischemic Stroke Rate (% per year) is equal to 4.8 % stroke rate/year from a score of 4 (HTN, Female, h/o TIA)    . Chronic fatigue   . Chronic pain   . COPD (chronic obstructive pulmonary disease) (HCC)   . Dyspnea on exertion    A.) Lexican Mycardial Perfusion with Wall Motion LVEF, Date:09/07/2011, TID ratio 1.03, lung to heart ratio 26%.  Marland Kitchen Hypothyroidism   . Obesity   . Seasonal allergies   . Thyroid disease    HYPOTHYROID  . TIA (transient ischemic attack) 2010   A.) Carotid Duplex study, 10/08/2008, minimal disease    Past Surgical History:  Procedure Laterality Date  . CARDIOVERSION N/A 08/16/2012   Procedure: CARDIOVERSION;  Surgeon: Marykay Lex, MD;  Location: [facility] ENDOSCOPY;  Service: Endoscopy;  Laterality: N/A;  labs on arrival   . CARDIOVERSION N/A 09/14/2012   Procedure: CARDIOVERSION;  Surgeon: Chrystie Nose, MD;  Location[facility] ENDOSCOPY;  Service: Cardiovascular;  Laterality: N/A;  . 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 NM MYOVIEW LTD  April 2013   No evidence of ischemia or infarction. LOW  RISK;  normal EF  . TONSILLECTOMY    . TOTAL HIP ARTHROPLASTY  [id number]  . TRANSTHORACIC ECHOCARDIOGRAM  09-2012   Moderate concentric LVH. EF 60-65%.; Mild LA dilation.    Current Meds  Medication Sig  . diltiazem (CARDIZEM) 60 MG tablet Take 1 tablet (60 mg total) by mouth 4 (four) times daily. (Patient taking differently: Take 60 mg by mouth as directed. )  . diltiazem (CARTIA XT) 240 MG 24 hr capsule Take 1 capsule (240 mg total) by mouth daily.  . furosemide (LASIX) 40 MG tablet Take 1 tablet (40 mg total) by mouth daily.  . Ibuprofen (ADVIL) 200 MG CAPS Take 2 capsules by mouth at bedtime.  Marland Kitchen levothyroxine (SYNTHROID, LEVOTHROID) 100 MCG tablet Take 1 tablet (100 mcg total) by mouth daily.  Marland Kitchen warfarin (COUMADIN) 2.5 MG tablet  Take 1 tablet (2.5 mg total) by mouth daily.    Allergies  Allergen Reactions  . Amiodarone Diarrhea    Social History   Social History  . Marital status: Divorced    Spouse name: N/A  . Number of children: N/A  . Years of education: N/A   Social History Main Topics  . Smoking status: Former Smoker    Quit date: 06/07/2004  . Smokeless tobacco: Never Used  . Alcohol use No     Comment: occasionally  . Drug use: No  . Sexual activity: Not Currently   Other Topics Concern  . None   Social History Narrative   She is divorced, and is accompanied by her long-time companion/friend who is always with her. They are both transplants from [place] (she is from [place]).   She is a former smoker, who quit in 2006. She claims to never have "inhaled."   She does not get routine exercise but she and her friend are very active.    family history is not on file. NO premature CAD or Afib.    Wt Readings from Last 3 Encounters:  09/14/16 202 lb 9.6 oz (91.9 kg)  04/12/16 205 lb 9.6 oz (93.3 kg)  04/05/16 207 lb 12.8 oz (94.3 kg)    PHYSICAL EXAM BP 134/72   Pulse 94   Ht  (1.6 m)   Wt 202 lb 9.6 oz (91.9 kg)   BMI 35.89 kg/m  General  appearance: alert, cooperative, appears stated age, no distress and Mildly to moderately obese Neck: no adenopathy, no carotid bruit and no JVD Lungs: clear to auscultation bilaterally, normal percussion bilaterally and non-labored Heart: Irregularly irregular rhythm. Controlled rate, S1 and S2 normal, no click, rub or gallop. 1/6 HSM at LLSB. Abdomen: soft, non-tender; bowel sounds normal; no masses,  no organomegaly; No HJR Extremities: extremities normal, atraumatic, no cyanosis, and edema - trace Pulses: 2+ and symmetric;  Neurologic: Mental status: Alert, oriented, thought content appropriate    Adult ECG Report  Rate: 94 ;  Rhythm: atrial fibrillation, premature ventricular contractions (PVC) and Nonspecific ST and T-wave abnormality. Low voltage.;   Narrative Interpretation: Relatively stable EKG   Other studies Reviewed: Additional studies/ records that were reviewed today include:  Recent Labs:  n/a     ASSESSMENT / PLAN: Problem List Items Addressed This Visit    Atrial fibrillation, chronic (HCC): CHA2DS2Vasc = 4, on Warfarin; CCB for Rate control - Primary (Chronic)    Permanent/persistent A. fib. Rate control with diltiazem. No signs of rapid arrhythmias. No signs of bradycardia. Pretty much asymptomatic. Bleeding issues with warfarin.      Relevant Orders   EKG 12-Lead (Completed)   Bilateral lower extremity edema (Chronic)    Relatively well-controlled with current dose of Lasix.      Essential hypertension (Chronic)    Stable on current dose of diltiazem. Some effect from furosemide.      Relevant Orders   EKG 12-Lead (Completed)   Obesity (BMI 35.0-39.9 without comorbidity) -  (Chronic)    At this point I think she is just very limited for the reasons noted in history of present illness. I tried to convince her to go out for walks just simply to get some time away.        - Total time of patient was close to 1 hour. Greater than 50% of the time was  noncardiac counseling.  Current medicines are reviewed at length with the patient today. (+/-  concerns) None The following changes have been made: None  Patient Instructions  No change with medications   Your physician wants you to follow-up in 6 months with Dr Herbie Baltimore.You will receive a reminder letter in the mail two months in advance. If you don't receive a letter, please call our office to schedule the follow-up appointment.   If you need a refill on your cardiac medications before your next appointment, please call your pharmacy.     Studies Ordered:   Orders Placed This Encounter  Procedures  . EKG 12-Lead      Bryan Lemma, M.D., M.S. Interventional Cardiologist   Pager # [phone] Phone # [phone] [address] [place], [place] [address]

## 2016-09-16 NOTE — Assessment & Plan Note (Signed)
At this point I think she is just very limited for the reasons noted in history of present illness. I tried to convince her to go out for walks just simply to get some time away.

## 2016-09-16 NOTE — Assessment & Plan Note (Signed)
Permanent/persistent A. fib. Rate control with diltiazem. No signs of rapid arrhythmias. No signs of bradycardia. Pretty much asymptomatic. Bleeding issues with warfarin.

## 2016-09-16 NOTE — Assessment & Plan Note (Signed)
Relatively well-controlled with current dose of Lasix.

## 2016-09-16 NOTE — Assessment & Plan Note (Signed)
Stable on current dose of diltiazem. Some effect from furosemide.

## 2016-09-21 ENCOUNTER — Ambulatory Visit (INDEPENDENT_AMBULATORY_CARE_PROVIDER_SITE_OTHER): Payer: Medicare Other | Admitting: Pharmacist Clinician (PhC)/ Clinical Pharmacy Specialist

## 2016-09-21 DIAGNOSIS — I482 Chronic atrial fibrillation, unspecified: Secondary | ICD-10-CM

## 2016-11-08 ENCOUNTER — Other Ambulatory Visit: Payer: Self-pay | Admitting: Cardiology

## 2016-11-08 DIAGNOSIS — I482 Chronic atrial fibrillation: Secondary | ICD-10-CM | POA: Diagnosis not present

## 2016-11-08 DIAGNOSIS — I4891 Unspecified atrial fibrillation: Secondary | ICD-10-CM | POA: Diagnosis not present

## 2016-11-09 LAB — PROTIME-INR
INR: 2.4 — AB
PROTHROMBIN TIME: 24.9 s — AB (ref 9.0–11.5)

## 2016-11-11 ENCOUNTER — Ambulatory Visit (INDEPENDENT_AMBULATORY_CARE_PROVIDER_SITE_OTHER): Payer: Self-pay | Admitting: Pharmacist Clinician (PhC)/ Clinical Pharmacy Specialist

## 2016-11-11 DIAGNOSIS — I482 Chronic atrial fibrillation, unspecified: Secondary | ICD-10-CM

## 2016-12-10 ENCOUNTER — Other Ambulatory Visit: Payer: Self-pay | Admitting: Cardiology

## 2016-12-10 DIAGNOSIS — I482 Chronic atrial fibrillation: Secondary | ICD-10-CM | POA: Diagnosis not present

## 2016-12-10 DIAGNOSIS — I4891 Unspecified atrial fibrillation: Secondary | ICD-10-CM | POA: Diagnosis not present

## 2016-12-11 LAB — PROTIME-INR
INR: 2.7 — ABNORMAL HIGH
Prothrombin Time: 27.8 s — ABNORMAL HIGH (ref 9.0–11.5)

## 2016-12-20 ENCOUNTER — Ambulatory Visit (INDEPENDENT_AMBULATORY_CARE_PROVIDER_SITE_OTHER): Payer: Medicare Other | Admitting: Pharmacist Clinician (PhC)/ Clinical Pharmacy Specialist

## 2016-12-20 DIAGNOSIS — I482 Chronic atrial fibrillation, unspecified: Secondary | ICD-10-CM

## 2016-12-30 ENCOUNTER — Other Ambulatory Visit: Payer: Self-pay | Admitting: Pharmacist

## 2016-12-30 MED ORDER — WARFARIN SODIUM 2.5 MG PO TABS: 2.5000 mg | ORAL_TABLET | Freq: Every day | ORAL | 1 refills | Status: DC

## 2017-01-12 ENCOUNTER — Other Ambulatory Visit: Payer: Self-pay | Admitting: Cardiology

## 2017-01-12 DIAGNOSIS — I4891 Unspecified atrial fibrillation: Secondary | ICD-10-CM | POA: Diagnosis not present

## 2017-01-12 DIAGNOSIS — I482 Chronic atrial fibrillation: Secondary | ICD-10-CM | POA: Diagnosis not present

## 2017-01-13 LAB — PROTIME-INR
INR: 2.4 — ABNORMAL HIGH
Prothrombin Time: 25 s — ABNORMAL HIGH (ref 9.0–11.5)

## 2017-01-17 ENCOUNTER — Ambulatory Visit (INDEPENDENT_AMBULATORY_CARE_PROVIDER_SITE_OTHER): Payer: Medicare Other | Admitting: Pharmacist Clinician (PhC)/ Clinical Pharmacy Specialist

## 2017-01-17 DIAGNOSIS — I482 Chronic atrial fibrillation, unspecified: Secondary | ICD-10-CM

## 2017-01-25 ENCOUNTER — Ambulatory Visit (INDEPENDENT_AMBULATORY_CARE_PROVIDER_SITE_OTHER): Payer: Medicare Other | Admitting: Physician Assistant

## 2017-01-25 ENCOUNTER — Encounter: Payer: Self-pay | Admitting: Physician Assistant

## 2017-01-25 VITALS — BP 127/85 | HR 96 | Temp 98.4°F | Resp 16 | Ht 63.0 in | Wt 200.8 lb

## 2017-01-25 DIAGNOSIS — R062 Wheezing: Secondary | ICD-10-CM | POA: Diagnosis not present

## 2017-01-25 DIAGNOSIS — R059 Cough, unspecified: Secondary | ICD-10-CM

## 2017-01-25 DIAGNOSIS — R05 Cough: Secondary | ICD-10-CM | POA: Diagnosis not present

## 2017-01-25 DIAGNOSIS — J209 Acute bronchitis, unspecified: Secondary | ICD-10-CM | POA: Diagnosis not present

## 2017-01-25 MED ORDER — BENZONATATE 100 MG PO CAPS: 100.0000 mg | ORAL_CAPSULE | Freq: Three times a day (TID) | ORAL | 0 refills | Status: DC | PRN

## 2017-01-25 MED ORDER — ALBUTEROL SULFATE (2.5 MG/3ML) 0.083% IN NEBU
2.5000 mg | INHALATION_SOLUTION | Freq: Once | RESPIRATORY_TRACT | Status: AC
  Administered 2017-01-25: 2.5 mg via RESPIRATORY_TRACT

## 2017-01-25 MED ORDER — ALBUTEROL SULFATE HFA 108 (90 BASE) MCG/ACT IN AERS: 2.0000 | INHALATION_SPRAY | RESPIRATORY_TRACT | 1 refills | Status: DC | PRN

## 2017-01-25 MED ORDER — IPRATROPIUM BROMIDE 0.02 % IN SOLN
0.5000 mg | Freq: Once | RESPIRATORY_TRACT | Status: AC
  Administered 2017-01-25: 0.5 mg via RESPIRATORY_TRACT

## 2017-01-25 MED ORDER — AZITHROMYCIN 250 MG PO TABS: ORAL_TABLET | ORAL | 0 refills | Status: DC

## 2017-01-25 MED ORDER — HYDROCODONE-HOMATROPINE 5-1.5 MG/5ML PO SYRP: 5.0000 mL | ORAL_SOLUTION | Freq: Three times a day (TID) | ORAL | 0 refills | Status: DC | PRN

## 2017-01-25 NOTE — Progress Notes (Signed)
[name]  MRN: [id number] DOB: [birth year]  PCP: Ronnald Nian, MD  Subjective:  Pt is a 76 year old female PMH COPD, stroke and atrial fibrillation who presents to clinic for cough and shortness of breath x 1 week. Symptoms started with chest congestion. Cough is productive. "White at first, now is a little bit yellow". Cough wakes her up at night. Endorses shob.  She took 2 days worth of Mucinex.  Denies night sweats, fever, chills, chest pain, palpitations, neck or arm pain, coughing up blood.  She is on anticoagulation therapy with warfarin for atrial fibrillation.    Former smoker - however she never inhaled bc it made her very sick. Started when she was 14. Quit in 2004. Smoked 2 packs/day.   Review of Systems  Constitutional: Negative for chills, fatigue and fever.  Respiratory: Positive for cough, shortness of breath and wheezing.   Psychiatric/Behavioral: Positive for sleep disturbance.    Patient Active Problem List   Diagnosis Date Noted  . Long-term (current) use of anticoagulants 10/10/2014  . Atrial fibrillation, chronic (HCC): CHA2DS2Vasc = 4, on Warfarin; CCB for Rate control 05/01/2013  . Bilateral lower extremity edema 05/01/2013  . Obesity (BMI 35.0-39.9 without comorbidity) -  02/26/2013  . Seasonal allergies 02/26/2013  . Long term current use of anticoagulant therapy 08/28/2012  . TIA (transient ischemic attack) 02/24/2012  . Hypothyroidism 02/05/2010  . Essential hypertension 02/05/2010    Current Outpatient Prescriptions on File Prior to Visit  Medication Sig Dispense Refill  . diltiazem (CARTIA XT) 240 MG 24 hr capsule Take 1 capsule (240 mg total) by mouth daily. 90 capsule 2  . furosemide (LASIX) 40 MG tablet Take 1 tablet (40 mg total) by mouth daily. 90 tablet 2  . Ibuprofen (ADVIL) 200 MG CAPS Take 2 capsules by mouth at bedtime.    Marland Kitchen levothyroxine (SYNTHROID, LEVOTHROID) 100 MCG tablet Take 1 tablet (100 mcg total) by mouth daily. 90 tablet  2  . warfarin (COUMADIN) 2.5 MG tablet Take 1 tablet (2.5 mg total) by mouth daily. 90 tablet 1  . diltiazem (CARDIZEM) 60 MG tablet Take 1 tablet (60 mg total) by mouth 4 (four) times daily. (Patient not taking: Reported on 01/25/2017) 90 tablet 1   No current facility-administered medications on file prior to visit.     Allergies  Allergen Reactions  . Amiodarone Diarrhea     Objective:  BP 127/85   Pulse 96   Temp 98.4 F (36.9 C) (Oral)   Resp 16   Ht 5\' 3"  (1.6 m)   Wt 200 lb 12.8 oz (91.1 kg)   SpO2 98%   BMI 35.57 kg/m   Physical Exam  Constitutional: She is oriented to person, place, and time and well-developed, well-nourished, and in no distress. No distress.  Cardiovascular: Normal rate, regular rhythm and normal heart sounds.   Pulmonary/Chest: No accessory muscle usage. No respiratory distress. She has wheezes in the right upper field, the right middle field, the left upper field and the left middle field.  Neurological: She is alert and oriented to person, place, and time. GCS score is 15.  Skin: Skin is warm and dry.  Psychiatric: Mood, memory, affect and judgment normal.  Vitals reviewed.   Assessment and Plan :  1. Acute bronchitis, unspecified organism 2. Wheezing - azithromycin (ZITHROMAX) 250 MG tablet; Take 2 tabs PO x 1 dose, then 1 tab PO QD x 4 days  Dispense: 6 tablet; Refill: 0 - albuterol (PROVENTIL) (  2.5 MG/3ML) 0.083% nebulizer solution 2.5 mg; Take 3 mLs (2.5 mg total) by nebulization once. - ipratropium (ATROVENT) nebulizer solution 0.5 mg; Take 2.5 mLs (0.5 mg total) by nebulization once. - albuterol (PROVENTIL HFA;VENTOLIN HFA) 108 (90 Base) MCG/ACT inhaler; Inhale 2 puffs into the lungs every 4 (four) hours as needed for wheezing or shortness of breath (cough, shortness of breath or wheezing.).  Dispense: 1 Inhaler; Refill: 1 - Improved lung sounds after breathing treatment, however pt does not endorse improvement of symptoms. Will cover with  Azithromycin. Albuterol 2puffs q 4-6 hrs x 2 days.  Encouraged pt to push fluids and rest. RTC in 5-7 days if no improvement.  3. Cough - HYDROcodone-homatropine (HYCODAN) 5-1.5 MG/5ML syrup; Take 5 mLs by mouth every 8 (eight) hours as needed for cough.  Dispense: 120 mL; Refill: 0 - benzonatate (TESSALON) 100 MG capsule; Take 1-2 capsules (100-200 mg total) by mouth 3 (three) times daily as needed for cough.  Dispense: 40 capsule; Refill: 0   Marco Collie, PA-C  [facility] 01/25/2017 4:07 PM

## 2017-01-25 NOTE — Patient Instructions (Addendum)
Azithromycin may increase your INR. Call your cardiologist to schedule an appointment for INR recheck in 1 week.  Stay well hydrated. See below for home care tips.   Albuterol - 2 puffs every 4-6 hours for the next 2 days. Then as needed for wheezing.  Come back if you are not better in 5-7 days.   Thank you for coming in today. I hope you feel we met your needs.  Feel free to call PCP if you have any questions or further requests.  Please consider signing up for MyChart if you do not already have it, as this is a great way to communicate with me.  Best,  Whitney McVey, PA-C   For sore throat try using a honey-based tea. Use 3 teaspoons of honey with juice squeezed from half lemon. Place shaved pieces of ginger into 1/2-1 cup of water and warm over stove top. Then mix the ingredients and repeat every 4 hours as needed.  Cough Syrup Recipe: Sweet Lemon & Honey Thyme  Ingredients a handful of fresh thyme sprigs   1 pint of water (2 cups)  1/2 cup honey (raw is best, but regular will do)  1/2 lemon chopped Instructions 1. Place the lemon in the pint jar and cover with the honey. The honey will macerate the lemons and draw out liquids which taste so delicious! 2. Meanwhile, toss the thyme leaves into a saucepan and cover them with the water. 3. Bring the water to a gentle simmer and reduce it to half, about a cup of tea. 4. When the tea is reduced and cooled a bit, strain the sprigs & leaves, add it into the pint jar and stir it well. 5. Give it a shake and use a spoonful as needed. 6. Store your homemade cough syrup in the refrigerator for about a month.  What causes a cough? In adults, common causes of a cough include: ?An infection of the airways or lungs (such as the common cold) ?Postnasal drip - Postnasal drip is when mucus from the nose drips down or flows along the back of the throat. Postnasal drip can happen when people have: .A cold .Allergies .A sinus infection - The  sinuses are hollow areas in the bones of the face that open into the nose. ?Lung conditions, like asthma and chronic obstructive pulmonary disease (COPD) - Both of these conditions can make it hard to breathe. COPD is usually caused by smoking. ?Acid reflux - Acid reflux is when the acid that is normally in your stomach backs up into your esophagus (the tube that carries food from your mouth to your stomach). ?A side effect from blood pressure medicines called "ACE inhibitors" ?Smoking cigarettes  Is there anything I can do on my own to get rid of my cough? Yes. To help get rid of your cough, you can: ?Use a humidifier in your bedroom ?Use an over-the-counter cough medicine, or suck on cough drops or hard candy ?Stop smoking, if you smoke ?If you have allergies, avoid the things you are allergic to (like pollen, dust, animals, or mold) If you have acid reflux, your doctor or nurse will tell you which lifestyle changes can help reduce symptoms.    IF you received an x-ray today, you will receive an invoice from [facility]. Please contact [facility] at [phone] with questions or concerns regarding your invoice.   IF you received labwork today, you will receive an invoice from [place]. Please contact [facility] at [phone] with questions or  concerns regarding your invoice.   Our billing staff will not be able to assist you with questions regarding bills from these companies.  You will be contacted with the lab results as soon as they are available. The fastest way to get your results is to activate your My Chart account. Instructions are located on the last page of this paperwork. If you have not heard from [place] regarding the results in 2 weeks, please contact this office.

## 2017-01-26 ENCOUNTER — Telehealth: Payer: Self-pay | Admitting: Cardiology

## 2017-01-26 ENCOUNTER — Telehealth: Payer: Self-pay | Admitting: Pharmacist Clinician (PhC)/ Clinical Pharmacy Specialist

## 2017-01-26 ENCOUNTER — Telehealth: Payer: Self-pay | Admitting: Internal Medicine

## 2017-01-26 NOTE — Telephone Encounter (Signed)
Spoke with patient.  Advised that we rarely see any problems with patients taking azithromycin and warfarin.  She held dose last night to be safe.  She can continue tonight with normal dose.    Also suggested that since she spent all night coughing and got no sleep, she should fill the prescription for hydromet, as it will help with both the cough as well as hopefully make her drowsy enough to get some rest.

## 2017-01-26 NOTE — Telephone Encounter (Signed)
Pt was notified that she needed to schedule an appointment with Dr. Susann Givens as its been a while and she is sick right now so she will call back

## 2017-01-26 NOTE — Telephone Encounter (Signed)
New message   Pt c/o medication issue:  1. Name of Medication: azithromycin (ZITHROMAX) 250 MG tablet  2. How are you currently taking this medication (dosage and times per day)? 250MG   3. Are you having a reaction (difficulty breathing--STAT)? NO  4. What is your medication issue? Pt states this might increase INR and she wants to let us know this was prescribed by her PCP

## 2017-01-26 NOTE — Telephone Encounter (Signed)
New messge    Pt wants to speak with [name] about her blood thinner. She did not take it last night and has some questions and concerns for her

## 2017-01-31 ENCOUNTER — Ambulatory Visit (INDEPENDENT_AMBULATORY_CARE_PROVIDER_SITE_OTHER): Payer: Medicare Other | Admitting: Family Medicine

## 2017-01-31 ENCOUNTER — Encounter: Payer: Self-pay | Admitting: Family Medicine

## 2017-01-31 ENCOUNTER — Ambulatory Visit (INDEPENDENT_AMBULATORY_CARE_PROVIDER_SITE_OTHER): Payer: Medicare Other

## 2017-01-31 VITALS — BP 118/79 | HR 83 | Temp 98.1°F | Resp 16 | Ht 63.0 in | Wt 196.0 lb

## 2017-01-31 DIAGNOSIS — Z7901 Long term (current) use of anticoagulants: Secondary | ICD-10-CM

## 2017-01-31 DIAGNOSIS — J441 Chronic obstructive pulmonary disease with (acute) exacerbation: Secondary | ICD-10-CM

## 2017-01-31 DIAGNOSIS — R05 Cough: Secondary | ICD-10-CM

## 2017-01-31 DIAGNOSIS — I4891 Unspecified atrial fibrillation: Secondary | ICD-10-CM | POA: Diagnosis not present

## 2017-01-31 DIAGNOSIS — R059 Cough, unspecified: Secondary | ICD-10-CM

## 2017-01-31 MED ORDER — ALBUTEROL SULFATE (2.5 MG/3ML) 0.083% IN NEBU
2.5000 mg | INHALATION_SOLUTION | Freq: Once | RESPIRATORY_TRACT | Status: AC
  Administered 2017-01-31: 2.5 mg via RESPIRATORY_TRACT

## 2017-01-31 MED ORDER — IPRATROPIUM BROMIDE 0.02 % IN SOLN
0.5000 mg | Freq: Once | RESPIRATORY_TRACT | Status: AC
  Administered 2017-01-31: 0.5 mg via RESPIRATORY_TRACT

## 2017-01-31 MED ORDER — PREDNISONE 20 MG PO TABS: 40.0000 mg | ORAL_TABLET | Freq: Every day | ORAL | 0 refills | Status: DC

## 2017-01-31 NOTE — Patient Instructions (Addendum)
Lungs sound better after Albuterol and Atrovent nebulizer treatment. Okay to use albuterol puffer 1-2 puffs every 4-6 hours as needed for cough or wheezing. Prednisone prescribed for the next few days to lessen cough and wheezing seen with COPD flares.  You can contact her cardiologist's office to discuss if any dosing changes needed on your Coumadin, but also watch for any change in your atrial fibrillation symptoms.  Ok to continue [employer].  Cough syrup ONLY if you are not short of breath or wheezing.   Return to the clinic or go to the nearest emergency room if any of your symptoms worsen or new symptoms occur.   Chronic Obstructive Pulmonary Disease Exacerbation Chronic obstructive pulmonary disease (COPD) is a common lung condition in which airflow from the lungs is limited. COPD is a general term that can be used to describe many different lung problems that limit airflow, including chronic bronchitis and emphysema. COPD exacerbations are episodes when breathing symptoms become much worse and require extra treatment. Without treatment, COPD exacerbations can be life threatening, and frequent COPD exacerbations can cause further damage to your lungs. What are the causes?  Respiratory infections.  Exposure to smoke.  Exposure to air pollution, chemical fumes, or dust. Sometimes there is no apparent cause or trigger. What increases the risk?  Smoking cigarettes.  Older age.  Frequent prior COPD exacerbations. What are the signs or symptoms?  Increased coughing.  Increased thick spit (sputum) production.  Increased wheezing.  Increased shortness of breath.  Rapid breathing.  Chest tightness. How is this diagnosed? Your medical history, a physical exam, and tests will help your health care provider make a diagnosis. Tests may include:  A chest X-ray.  Basic lab tests.  Sputum testing.  An arterial blood gas test.  How is this treated? Depending on the  severity of your COPD exacerbation, you may need to be admitted to a hospital for treatment. Some of the treatments commonly used to treat COPD exacerbations are:  Antibiotic medicines.  Bronchodilators. These are drugs that expand the air passages. They may be given with an inhaler or nebulizer. Spacer devices may be needed to help improve drug delivery.  Corticosteroid medicines.  Supplemental oxygen therapy.  Airway clearing techniques, such as noninvasive ventilation (NIV) and positive expiratory pressure (PEP). These provide respiratory support through a mask or other noninvasive device.  Follow these instructions at home:  Do not smoke. Quitting smoking is very important to prevent COPD from getting worse and exacerbations from happening as often.  Avoid exposure to all substances that irritate the airway, especially to tobacco smoke.  If you were prescribed an antibiotic medicine, finish it all even if you start to feel better.  Take all medicines as directed by your health care provider.It is important to use correct technique with inhaled medicines.  Drink enough fluids to keep your urine clear or pale yellow (unless you have a medical condition that requires fluid restriction).  Use a cool mist vaporizer. This makes it easier to clear your chest when you cough.  If you have a home nebulizer and oxygen, continue to use them as directed.  Maintain all necessary vaccinations to prevent infections.  Exercise regularly.  Eat a healthy diet.  Keep all follow-up appointments as directed by your health care provider. Get help right away if:  You have worsening shortness of breath.  You have trouble talking.  You have severe chest pain.  You have blood in your sputum.  You have a  fever.  You have weakness, vomit repeatedly, or faint.  You feel confused.  You continue to get worse. This information is not intended to replace advice given to you by your health care  provider. Make sure you discuss any questions you have with your health care provider. Document Released: 03/21/2007 Document Revised: 10/30/2015 Document Reviewed: 01/26/2013 Elsevier Interactive Patient Education  2017 [employer].     IF you received an x-ray today, you will receive an invoice from [facility]. Please contact [facility] at [phone] with questions or concerns regarding your invoice.   IF you received labwork today, you will receive an invoice from [place]. Please contact [facility] at [phone] with questions or concerns regarding your invoice.   Our billing staff will not be able to assist you with questions regarding bills from these companies.  You will be contacted with the lab results as soon as they are available. The fastest way to get your results is to activate your My Chart account. Instructions are located on the last page of this paperwork. If you have not heard from [place] regarding the results in 2 weeks, please contact this office.

## 2017-01-31 NOTE — Progress Notes (Signed)
ER letter sent 

## 2017-01-31 NOTE — Progress Notes (Signed)
Subjective:  By signing my name below, I, Stann Ore, attest that this documentation has been prepared under the direction and in the presence of Meredith Staggers, MD. Electronically Signed: Stann Ore, Scribe. 01/31/2017 , 3:13 PM .  Patient was seen in Room 1 .   Patient ID: [name], female    DOB: March 17, 1941, 76 y.o.   MRN: [id number] Chief Complaint  Patient presents with  . Follow-up    cough productive yellow   HPI [name] is a 76 y.o. female Here for follow up. Patient has history of HTN, hypothyroidism, chronic anti-coagulation for afib, and COPD. She was seen 6 days ago by [employer], PA-C with cough and shortness of breath for 1 week. She states it started with chest congestion, productive cough initially white, then yellow phlegm with some shortness of breath. She was afebrile, SpO2 98%, and did have some diffuse wheezes on that exam. She was given albuterol and atrovent neb breathing treatment, improved lung sounds objectively. She was treated with azithromycin, albuterol inhaler, hydrocodone cough syrup and tessalon. Phone note from cardiologist reviewed on Aug 22nd, regarding azithromycin and coumadin.   Patient states her cough is bothering her and choking on her phlegm. She's been drinking lemon and honey hot tea, OTC cough drops, trying to loosen and spit up the phlegm. She mentions coughing more and feeling tight 3 days ago.   She used the ProAir albuterol inhaler about 2 times a day with some relief. She last used albuterol inhaler today, prior to coming into the office. She also took the hycodan cough syrup about twice a day. She had some wheezing last night even after albuterol before going to bed. She denies any measured fever. She denies any known sick contact. Overall, she believes her symptoms have improved.   She has an appointment to see her cardiologist in Oct.   Patient is from [facility]. She's been in [place] for about 18 years now,  almost 19 years. She occasionally visits her family in [place] (her mother, her son, and granddaughter).   Patient Active Problem List   Diagnosis Date Noted  . Long-term (current) use of anticoagulants 10/10/2014  . Atrial fibrillation, chronic (HCC): CHA2DS2Vasc = 4, on Warfarin; CCB for Rate control 05/01/2013  . Bilateral lower extremity edema 05/01/2013  . Obesity (BMI 35.0-39.9 without comorbidity) -  02/26/2013  . Seasonal allergies 02/26/2013  . Long term current use of anticoagulant therapy 08/28/2012  . TIA (transient ischemic attack) 02/24/2012  . Hypothyroidism 02/05/2010  . Essential hypertension 02/05/2010   Past Medical History:  Diagnosis Date  . Atrial fibrillation, chronic (HCC): CHA2DS2Vasc = 4, on Warfarin; CCB for Rate control 05/01/2013   Previously on Flecainide - stopped due to recurrent Afib -- no longer effective. Did not tolerate Multaq.   This patients CHA2DS2-VASc Score and unadjusted Ischemic Stroke Rate (% per year) is equal to 4.8 % stroke rate/year from a score of 4 (HTN, Female, h/o TIA)    . Chronic fatigue   . Chronic pain   . COPD (chronic obstructive pulmonary disease) (HCC)   . Dyspnea on exertion    A.) Lexican Mycardial Perfusion with Wall Motion LVEF, Date:09/07/2011, TID ratio 1.03, lung to heart ratio 26%.  Marland Kitchen Hypothyroidism   . Obesity   . Seasonal allergies   . Stroke (HCC)   . Thyroid disease    HYPOTHYROID  . TIA (transient ischemic attack) 2010   A.) Carotid Duplex study, 10/08/2008,  minimal disease   Past Surgical History:  Procedure Laterality Date  . CARDIOVERSION N/A 08/16/2012   Procedure: CARDIOVERSION;  Surgeon: Marykay Lex, MD;  Location: [facility] ENDOSCOPY;  Service: Endoscopy;  Laterality: N/A;  labs on arrival   . CARDIOVERSION N/A 09/14/2012   Procedure: CARDIOVERSION;  Surgeon: Chrystie Nose, MD;  Location: [facility] ENDOSCOPY;  Service: Cardiovascular;  Laterality: N/A;  . 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 NM  MYOVIEW LTD  April 2013   No evidence of ischemia or infarction. LOW RISK;  normal EF  . TONSILLECTOMY    . TOTAL HIP ARTHROPLASTY  [id number]  . TRANSTHORACIC ECHOCARDIOGRAM  09-2012   Moderate concentric LVH. EF 60-65%.; Mild LA dilation.   Allergies  Allergen Reactions  . Amiodarone Diarrhea   Prior to Admission medications   Medication Sig Start Date End Date Taking? Authorizing Provider  albuterol (PROVENTIL HFA;VENTOLIN HFA) 108 (90 Base) MCG/ACT inhaler Inhale 2 puffs into the lungs every 4 (four) hours as needed for wheezing or shortness of breath (cough, shortness of breath or wheezing.). 01/25/17   McVey, Madelaine Bhat, PA-C  azithromycin (ZITHROMAX) 250 MG tablet Take 2 tabs PO x 1 dose, then 1 tab PO QD x 4 days 01/25/17   McVey, Madelaine Bhat, PA-C  benzonatate (TESSALON) 100 MG capsule Take 1-2 capsules (100-200 mg total) by mouth 3 (three) times daily as needed for cough. 01/25/17   McVey, Madelaine Bhat, PA-C  diltiazem (CARTIA XT) 240 MG 24 hr capsule Take 1 capsule (240 mg total) by mouth daily. 06/30/16   Marykay Lex, MD  furosemide (LASIX) 40 MG tablet Take 1 tablet (40 mg total) by mouth daily. 06/30/16   Marykay Lex, MD  HYDROcodone-homatropine [facility]) 5-1.5 MG/5ML syrup Take 5 mLs by mouth every 8 (eight) hours as needed for cough. 01/25/17   McVey, Madelaine Bhat, PA-C  Ibuprofen (ADVIL) 200 MG CAPS Take 2 capsules by mouth at bedtime.    [provider]  levothyroxine (SYNTHROID, LEVOTHROID) 100 MCG tablet Take 1 tablet (100 mcg total) by mouth daily. 07/05/16   Ronnald Nian, MD  warfarin (COUMADIN) 2.5 MG tablet Take 1 tablet (2.5 mg total) by mouth daily. 12/30/16   Marykay Lex, MD   Social History   Social History  . Marital status: Divorced    Spouse name: N/A  . Number of children: N/A  . Years of education: N/A   Occupational History  . Not on file.   Social History Main Topics  . Smoking status: Former Smoker     Packs/day: 2.00    Quit date: 06/07/2004  . Smokeless tobacco: Never Used  . Alcohol use No     Comment: occasionally  . Drug use: No  . Sexual activity: Not Currently   Other Topics Concern  . Not on file   Social History Narrative   She is divorced, and is accompanied by her long-time companion/friend who is always with her. They are both transplants from [place] (she is from [place]).   She is a former smoker, who quit in 2006. She claims to never have "inhaled."   She does not get routine exercise but she and her friend are very active.   Review of Systems  Constitutional: Positive for fever. Negative for chills, fatigue and unexpected weight change.  Respiratory: Positive for cough and chest tightness.   Gastrointestinal: Negative for constipation, diarrhea, nausea and vomiting.  Skin: Negative for rash and wound.  Neurological: Negative for dizziness, weakness and headaches.       Objective:   Physical Exam  Constitutional: She is oriented to person, place, and time. She appears well-developed and well-nourished. No distress.  HENT:  Head: Normocephalic and atraumatic.  Nose: Right sinus exhibits no maxillary sinus tenderness and no frontal sinus tenderness. Left sinus exhibits no maxillary sinus tenderness and no frontal sinus tenderness.  Mouth/Throat: Oropharynx is clear and moist and mucous membranes are normal.  No postnasal drip, moist mucosa  Eyes: Pupils are equal, round, and reactive to light. EOM are normal.  Neck: Neck supple.  Cardiovascular: Normal rate.  An irregularly irregular rhythm present.  Pulmonary/Chest: Effort normal. No respiratory distress. She has wheezes (expiratory) in the right lower field and the left lower field.  Expiratory wheezes at bases bilaterally  Musculoskeletal: Normal range of motion.  No lower extremity edema  Lymphadenopathy:    She has no cervical adenopathy.  Neurological: She is alert and oriented to person, place, and time.    Skin: Skin is warm and dry.  Psychiatric: She has a normal mood and affect. Her behavior is normal.  Nursing note and vitals reviewed.   Vitals:   01/31/17 1428  BP: 118/79  Pulse: 83  Resp: 16  Temp: 98.1 F (36.7 C)  TempSrc: Oral  SpO2: 93%  Weight: 196 lb (88.9 kg)  Height: 5\' 3"  (1.6 m)   Dg Chest 2 View  Result Date: 01/31/2017 CLINICAL DATA:  Cough and wheezing.  History of COPD. EXAM: CHEST  2 VIEW COMPARISON:  07/19/2011 and prior radiograph FINDINGS: Cardiomegaly, mild peribronchial thickening and mild bibasilar scarring again noted. There is no evidence of focal airspace disease, pulmonary edema, suspicious pulmonary nodule/mass, pleural effusion, or pneumothorax. No acute bony abnormalities are identified. IMPRESSION: No evidence of acute cardiopulmonary disease. Cardiomegaly and mild chronic peribronchial thickening. Electronically Signed   By: Harmon Pier M.D.   On: 01/31/2017 15:20   After albuterol and atrovent neb, few coarse breath sounds lower lobes, wheezing resolved.      Assessment & Plan:    [name] is a 76 y.o. female Cough - Plan: DG Chest 2 View, predniSONE (DELTASONE) 20 MG tablet COPD exacerbation (HCC) - Plan: DG Chest 2 View, albuterol (PROVENTIL) (2.5 MG/3ML) 0.083% nebulizer solution 2.5 mg, ipratropium (ATROVENT) nebulizer solution 0.5 mg, predniSONE (DELTASONE) 20 MG tablet  -Suspect  COPD exacerbation, mild at this point with some improvement after azithromycin. Improved after albuterol/Atrovent neb.   -Start prednisone 40 mg daily for 3 days, potential side effects discussed, including cautioned on worsening atrial fibrillation/tachycardia.   - Okay to continue Tessalon, hydrocodone cough syrup only if not shortness of breath or wheezing, Mucinex if not worsening wheeze, and pro-air 1-2 puffs every 4-6 hours when necessary. RTC precautions given.  Chronic anticoagulation Atrial fibrillation, unspecified type (HCC)  - Continue routine  follow-up with cardiologist. She did mention elevated heart rate at home in the morning. If the symptoms return, discuss with cardiology as it appears she has an extra dose of diltiazem to use if needed.  Meds ordered this encounter  Medications  . albuterol (PROVENTIL) (2.5 MG/3ML) 0.083% nebulizer solution 2.5 mg  . ipratropium (ATROVENT) nebulizer solution 0.5 mg  . predniSONE (DELTASONE) 20 MG tablet    Sig: Take 2 tablets (40 mg total) by mouth daily with breakfast.    Dispense:  6 tablet    Refill:  0   Patient Instructions    Lungs sound better  after Albuterol and Atrovent nebulizer treatment. Okay to use albuterol puffer 1-2 puffs every 4-6 hours as needed for cough or wheezing. Prednisone prescribed for the next few days to lessen cough and wheezing seen with COPD flares.  You can contact her cardiologist's office to discuss if any dosing changes needed on your Coumadin, but also watch for any change in your atrial fibrillation symptoms.  Ok to continue [employer].  Cough syrup ONLY if you are not short of breath or wheezing.   Return to the clinic or go to the nearest emergency room if any of your symptoms worsen or new symptoms occur.   Chronic Obstructive Pulmonary Disease Exacerbation Chronic obstructive pulmonary disease (COPD) is a common lung condition in which airflow from the lungs is limited. COPD is a general term that can be used to describe many different lung problems that limit airflow, including chronic bronchitis and emphysema. COPD exacerbations are episodes when breathing symptoms become much worse and require extra treatment. Without treatment, COPD exacerbations can be life threatening, and frequent COPD exacerbations can cause further damage to your lungs. What are the causes?  Respiratory infections.  Exposure to smoke.  Exposure to air pollution, chemical fumes, or dust. Sometimes there is no apparent cause or trigger. What increases the  risk?  Smoking cigarettes.  Older age.  Frequent prior COPD exacerbations. What are the signs or symptoms?  Increased coughing.  Increased thick spit (sputum) production.  Increased wheezing.  Increased shortness of breath.  Rapid breathing.  Chest tightness. How is this diagnosed? Your medical history, a physical exam, and tests will help your health care provider make a diagnosis. Tests may include:  A chest X-ray.  Basic lab tests.  Sputum testing.  An arterial blood gas test.  How is this treated? Depending on the severity of your COPD exacerbation, you may need to be admitted to a hospital for treatment. Some of the treatments commonly used to treat COPD exacerbations are:  Antibiotic medicines.  Bronchodilators. These are drugs that expand the air passages. They may be given with an inhaler or nebulizer. Spacer devices may be needed to help improve drug delivery.  Corticosteroid medicines.  Supplemental oxygen therapy.  Airway clearing techniques, such as noninvasive ventilation (NIV) and positive expiratory pressure (PEP). These provide respiratory support through a mask or other noninvasive device.  Follow these instructions at home:  Do not smoke. Quitting smoking is very important to prevent COPD from getting worse and exacerbations from happening as often.  Avoid exposure to all substances that irritate the airway, especially to tobacco smoke.  If you were prescribed an antibiotic medicine, finish it all even if you start to feel better.  Take all medicines as directed by your health care provider.It is important to use correct technique with inhaled medicines.  Drink enough fluids to keep your urine clear or pale yellow (unless you have a medical condition that requires fluid restriction).  Use a cool mist vaporizer. This makes it easier to clear your chest when you cough.  If you have a home nebulizer and oxygen, continue to use them as  directed.  Maintain all necessary vaccinations to prevent infections.  Exercise regularly.  Eat a healthy diet.  Keep all follow-up appointments as directed by your health care provider. Get help right away if:  You have worsening shortness of breath.  You have trouble talking.  You have severe chest pain.  You have blood in your sputum.  You have a fever.  You have weakness, vomit repeatedly, or faint.  You feel confused.  You continue to get worse. This information is not intended to replace advice given to you by your health care provider. Make sure you discuss any questions you have with your health care provider. Document Released: 03/21/2007 Document Revised: 10/30/2015 Document Reviewed: 01/26/2013 Elsevier Interactive Patient Education  2017 [employer].     IF you received an x-ray today, you will receive an invoice from [facility]. Please contact [facility] at [phone] with questions or concerns regarding your invoice.   IF you received labwork today, you will receive an invoice from [facility]. Please contact [facility] at [phone] with questions or concerns regarding your invoice.   Our billing staff will not be able to assist you with questions regarding bills from these companies.  You will be contacted with the lab results as soon as they are available. The fastest way to get your results is to activate your My Chart account. Instructions are located on the last page of this paperwork. If you have not heard from [place] regarding the results in 2 weeks, please contact this office.      I personally performed the services described in this documentation, which was scribed in my presence. The recorded information has been reviewed and considered for accuracy and completeness, addended by me as needed, and agree with information above.  Signed,   Meredith Staggers, MD [facility] at [facility].  01/31/17 3:49  PM

## 2017-02-01 ENCOUNTER — Telehealth: Payer: Self-pay | Admitting: Cardiology

## 2017-02-01 NOTE — Telephone Encounter (Signed)
Pt voiced understanding, but asked if she can just delay check for INR - she is scheduled to come in next week for this. Also states she's only taking 6x tablets (2 a day for 3 days starting today). (she was rather insistent that she wanted to just delay the INR check even though I advised her the recommendation would be to check itthis week.)

## 2017-02-01 NOTE — Telephone Encounter (Signed)
Noted  

## 2017-02-01 NOTE — Telephone Encounter (Signed)
Left msg for patient to call. 

## 2017-02-01 NOTE — Telephone Encounter (Signed)
Paid her primary doctor put her Prednisone this week. She wants to know if she needs to get lab work,since she started Prednisone?

## 2017-02-01 NOTE — Telephone Encounter (Signed)
Please have her repeat INR - prednisone can increase INR.  Should have lab on 3rd or 4th day on prednisone.

## 2017-02-01 NOTE — Telephone Encounter (Signed)
Reviewed med list, problem list. Will have pharmD review any concerns r/t concurrent use of prednisone and coumadin.

## 2017-02-03 ENCOUNTER — Telehealth: Payer: Self-pay | Admitting: Pharmacist Clinician (PhC)/ Clinical Pharmacy Specialist

## 2017-02-03 NOTE — Telephone Encounter (Signed)
Coumadin letter 

## 2017-02-08 ENCOUNTER — Other Ambulatory Visit: Payer: Self-pay | Admitting: Cardiology

## 2017-02-08 DIAGNOSIS — I482 Chronic atrial fibrillation: Secondary | ICD-10-CM | POA: Diagnosis not present

## 2017-02-08 DIAGNOSIS — I4891 Unspecified atrial fibrillation: Secondary | ICD-10-CM | POA: Diagnosis not present

## 2017-02-09 ENCOUNTER — Ambulatory Visit (INDEPENDENT_AMBULATORY_CARE_PROVIDER_SITE_OTHER): Payer: Medicare Other | Admitting: Pharmacist Clinician (PhC)/ Clinical Pharmacy Specialist

## 2017-02-09 DIAGNOSIS — Z7901 Long term (current) use of anticoagulants: Secondary | ICD-10-CM

## 2017-02-09 DIAGNOSIS — I482 Chronic atrial fibrillation, unspecified: Secondary | ICD-10-CM

## 2017-02-09 LAB — PROTIME-INR
INR: 3.7 — AB
PROTHROMBIN TIME: 38.8 s — AB (ref 9.0–11.5)

## 2017-02-16 ENCOUNTER — Other Ambulatory Visit: Payer: Self-pay | Admitting: Family Medicine

## 2017-02-16 ENCOUNTER — Other Ambulatory Visit: Payer: Self-pay | Admitting: Cardiology

## 2017-02-16 NOTE — Telephone Encounter (Signed)
LM for pt to call to schedule appt. She is  Past due for appt.

## 2017-03-08 ENCOUNTER — Other Ambulatory Visit: Payer: Self-pay | Admitting: Cardiology

## 2017-03-08 DIAGNOSIS — I4891 Unspecified atrial fibrillation: Secondary | ICD-10-CM | POA: Diagnosis not present

## 2017-03-08 DIAGNOSIS — I482 Chronic atrial fibrillation: Secondary | ICD-10-CM | POA: Diagnosis not present

## 2017-03-09 LAB — PROTIME-INR
INR: 2.4 — AB
PROTHROMBIN TIME: 24.8 s — AB (ref 9.0–11.5)

## 2017-03-11 ENCOUNTER — Ambulatory Visit (INDEPENDENT_AMBULATORY_CARE_PROVIDER_SITE_OTHER): Payer: Medicare Other | Admitting: Pharmacist

## 2017-03-11 DIAGNOSIS — I482 Chronic atrial fibrillation, unspecified: Secondary | ICD-10-CM

## 2017-03-11 DIAGNOSIS — Z7901 Long term (current) use of anticoagulants: Secondary | ICD-10-CM | POA: Diagnosis not present

## 2017-03-14 ENCOUNTER — Ambulatory Visit: Payer: Medicare Other | Admitting: Cardiology

## 2017-04-08 ENCOUNTER — Encounter: Payer: Self-pay | Admitting: Cardiology

## 2017-04-08 ENCOUNTER — Encounter: Payer: Medicare Other | Admitting: Pharmacist Clinician (PhC)/ Clinical Pharmacy Specialist

## 2017-04-08 ENCOUNTER — Other Ambulatory Visit: Payer: Self-pay | Admitting: Cardiology

## 2017-04-08 ENCOUNTER — Ambulatory Visit (INDEPENDENT_AMBULATORY_CARE_PROVIDER_SITE_OTHER): Payer: Medicare Other | Admitting: Cardiology

## 2017-04-08 VITALS — BP 117/79 | HR 92 | Ht 62.5 in | Wt 198.8 lb

## 2017-04-08 DIAGNOSIS — I4891 Unspecified atrial fibrillation: Secondary | ICD-10-CM | POA: Diagnosis not present

## 2017-04-08 DIAGNOSIS — I10 Essential (primary) hypertension: Secondary | ICD-10-CM | POA: Diagnosis not present

## 2017-04-08 DIAGNOSIS — R600 Localized edema: Secondary | ICD-10-CM | POA: Diagnosis not present

## 2017-04-08 DIAGNOSIS — I482 Chronic atrial fibrillation, unspecified: Secondary | ICD-10-CM

## 2017-04-08 DIAGNOSIS — G459 Transient cerebral ischemic attack, unspecified: Secondary | ICD-10-CM

## 2017-04-08 DIAGNOSIS — Z7901 Long term (current) use of anticoagulants: Secondary | ICD-10-CM

## 2017-04-08 MED ORDER — WARFARIN SODIUM 2.5 MG PO TABS: 2.5000 mg | ORAL_TABLET | Freq: Every day | ORAL | 1 refills | Status: DC

## 2017-04-08 MED ORDER — DILTIAZEM HCL ER COATED BEADS 240 MG PO CP24: 240.0000 mg | ORAL_CAPSULE | Freq: Every day | ORAL | 3 refills | Status: DC

## 2017-04-08 NOTE — Patient Instructions (Signed)
Your physician wants you to follow-up in: 6 MONTHS WITH DR HARDING You will receive a reminder letter in the mail two months in advance. If you don't receive a letter, please call our office to schedule the follow-up appointment.   If you need a refill on your cardiac medications before your next appointment, please call your pharmacy.  

## 2017-04-08 NOTE — Progress Notes (Addendum)
PCP: Shade FloodGreene, Jeffrey R, MD  Clinic Note: Chief Complaint  Patient presents with  . Follow-up  . Atrial Fibrillation    HPI: [name] is a 76 y.o. female with a PMH below who presents today for routine six-month follow-up for A. fib and mild diastolic heart failure.  [name] was last seen on September 14, 2016 and really she complained mostly of social issues with her roommate at that time.  Was doing relatively well from a cardiac standpoint.  Recent Hospitalizations:  -- sick in Aug -> bronchitis --> all started when they had to have a large push cleared off the property and they were outside cleaning up all the limbs.  She got exposed to a lot of allergens and got quite sick.  Studies Personally Reviewed - (if available, images/films reviewed: From Epic Chart or Care Everywhere)  No new studies  Interval History: [name] presents today again feeling pretty much her normal self.  She really does not pay much attention to when there she is or is not in A. fib anymore.  She knows what she is going fast and she feels that sometimes in the morning.  Usually if she sits down and takes a few deep breaths and maybe drinks a glass of water, the heart rate comes down on its own.  She does take Lasix and that controls her edema.  She has no PND but has to sleep somewhat sitting upright more because of comfort and GI issues.  She has had some hemorrhoids and some blood with wiping, but no real melena, hematochezia or hematuria. She denies any real exertional dyspnea but is not very active.  She has noted some occasional vertigo, but denies any weakness or syncope/near syncope. No TIA/amaurosis fugax symptoms. No melena, hematochezia, hematuria, or epstaxis. No claudication.  ROS: A comprehensive was performed. Review of Systems  Constitutional: Positive for malaise/fatigue.  HENT: Negative for congestion and nosebleeds.   Respiratory: Positive for cough (Because of allergies.  Still  recovering from bronchitis) and wheezing (Still recovering from bronchitis). Negative for shortness of breath.   Gastrointestinal: Positive for blood in stool (With wiping from hemorrhoids) and constipation. Negative for abdominal pain, heartburn and melena.  Genitourinary: Negative for hematuria.  Musculoskeletal: Positive for joint pain (Knees and hips). Negative for myalgias (Just occasional cramping).  Neurological: Positive for dizziness (Vertigo). Negative for weakness.  Psychiatric/Behavioral: Negative for depression (I think she actually may be somewhat depressed with her social situation) and memory loss. The patient has insomnia. The patient is not nervous/anxious.   All other systems reviewed and are negative.   I have reviewed and (if needed) personally updated the patient's problem list, medications, allergies, past medical and surgical history, social and family history.   Past Medical History:  Diagnosis Date  . Atrial fibrillation, chronic (HCC): CHA2DS2Vasc = 4, on Warfarin; CCB for Rate control 05/01/2013   Previously on Flecainide - stopped due to recurrent Afib -- no longer effective. Did not tolerate Multaq.   This patients CHA2DS2-VASc Score and unadjusted Ischemic Stroke Rate (% per year) is equal to 4.8 % stroke rate/year from a score of 4 (HTN, Female, h/o TIA)    . Chronic fatigue   . Chronic pain   . COPD (chronic obstructive pulmonary disease) (HCC)   . Dyspnea on exertion    A.) Lexican Mycardial Perfusion with Wall Motion LVEF, Date:09/07/2011, TID ratio 1.03, lung to heart ratio 26%.  Marland Kitchen. Hypothyroidism   . Obesity   .  Seasonal allergies   . Stroke (HCC)   . Thyroid disease    HYPOTHYROID  . TIA (transient ischemic attack) 2010   A.) Carotid Duplex study, 10/08/2008, minimal disease    Past Surgical History:  Procedure Laterality Date  . 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 NM MYOVIEW LTD  April 2013   No evidence of ischemia or infarction. LOW  RISK;  normal EF  . TONSILLECTOMY    . TOTAL HIP ARTHROPLASTY  [id number]  . TRANSTHORACIC ECHOCARDIOGRAM  09-2012   Moderate concentric LVH. EF 60-65%.; Mild LA dilation.    Current Meds  Medication Sig  . albuterol (PROVENTIL HFA;VENTOLIN HFA) 108 (90 Base) MCG/ACT inhaler Inhale 2 puffs into the lungs every 4 (four) hours as needed for wheezing or shortness of breath (cough, shortness of breath or wheezing.).  Marland Kitchen diltiazem (CARDIZEM CD) 240 MG 24 hr capsule Take 1 capsule (240 mg total) by mouth daily.  . furosemide (LASIX) 40 MG tablet TAKE 1 TABLET BY MOUTH  DAILY  . Ibuprofen (ADVIL) 200 MG CAPS Take 2 capsules by mouth at bedtime.  Marland Kitchen levothyroxine (SYNTHROID, LEVOTHROID) 100 MCG tablet Take 1 tablet (100 mcg total) by mouth daily.  Marland Kitchen warfarin (COUMADIN) 2.5 MG tablet Take 1 tablet (2.5 mg total) by mouth daily.  . [DISCONTINUED] diltiazem (CARDIZEM CD) 240 MG 24 hr capsule TAKE 1 CAPSULE BY MOUTH  DAILY  . [DISCONTINUED] warfarin (COUMADIN) 2.5 MG tablet Take 1 tablet (2.5 mg total) by mouth daily.    Allergies  Allergen Reactions  . Amiodarone Diarrhea    Social History   Socioeconomic History  . Marital status: Divorced    Spouse name: None  . Number of children: None  . Years of education: None  . Highest education level: None  Social Needs  . Financial resource strain: None  . Food insecurity - worry: None  . Food insecurity - inability: None  . Transportation needs - medical: None  . Transportation needs - non-medical: None  Occupational History  . None  Tobacco Use  . Smoking status: Former Smoker    Packs/day: 2.00    Last attempt to quit: 06/07/2004    Years since quitting: 12.8  . Smokeless tobacco: Never Used  Substance and Sexual Activity  . Alcohol use: No    Comment: occasionally  . Drug use: No  . Sexual activity: Not Currently  Other Topics Concern  . None  Social History Narrative   She is divorced, and is accompanied by her long-time  companion/friend who is always with her. They are both transplants from [place] (she is from [place]).   She is a former smoker, who quit in 2006. She claims to never have "inhaled."   She does not get routine exercise but she and her friend are very active.    family history includes Alcoholism in her brother and father; Healthy in her sister.  Wt Readings from Last 3 Encounters:  04/08/17 198 lb 12.8 oz (90.2 kg)  01/31/17 196 lb (88.9 kg)  01/25/17 200 lb 12.8 oz (91.1 kg)    PHYSICAL EXAM BP 117/79   Pulse 92   Ht 5' 2.5" (1.588 m)   Wt 198 lb 12.8 oz (90.2 kg)   SpO2 97%   BMI 35.78 kg/m  Physical Exam  Constitutional: She is oriented to person, place, and time. She appears well-developed and well-nourished. No distress.  In a much better spirits today.  Moderately obese  HENT:  Head: Normocephalic and atraumatic.  Neck: No hepatojugular reflux and no JVD present. Carotid bruit is not present.  Cardiovascular: Normal rate, intact distal pulses and normal pulses. An irregularly irregular rhythm present. PMI is not displaced. Exam reveals no gallop.  Murmur heard.  Low-pitched blowing decrescendo holosystolic murmur is present with a grade of 1/6 at the lower left sternal border. Pulmonary/Chest: Effort normal and breath sounds normal. No respiratory distress. She has no wheezes. She has no rales.  Abdominal: Soft. Bowel sounds are normal. She exhibits no distension. There is no tenderness. There is no rebound.  Musculoskeletal: Normal range of motion. She exhibits no edema.  Neurological: She is alert and oriented to person, place, and time.  Skin: Skin is warm and dry. No erythema.  Psychiatric: She has a normal mood and affect. Her behavior is normal. Judgment and thought content normal.     Adult ECG Report N/a  Other studies Reviewed: Additional studies/ records that were reviewed today include:   Recent Labs:  F/u by PCP  ASSESSMENT / PLAN: Problem List Items  Addressed This Visit    Atrial fibrillation, chronic (HCC): CHA2DS2Vasc = 4, on Warfarin; CCB for Rate control - Primary (Chronic)    Persistent/permanent A. fib with now simply rate control using diltiazem.  No real rapid spells that are prolonged.  She seems to be under control them with slowing down her activity level.  Otherwise relatively astigmatic.  No bleeding issues with warfarin.      Relevant Medications   diltiazem (CARDIZEM CD) 240 MG 24 hr capsule   warfarin (COUMADIN) 2.5 MG tablet   Bilateral lower extremity edema (Chronic)    Seems euvolemic.  No significant edema today with Lasix.  Not having to take any additional doses.      Essential hypertension (Chronic)    Well-controlled with diltiazem and furosemide      Relevant Medications   diltiazem (CARDIZEM CD) 240 MG 24 hr capsule   warfarin (COUMADIN) 2.5 MG tablet   Long term current use of anticoagulant therapy (Chronic)    Follows up with her INR is here on warfarin with no complaints.  The biggest concern she has is getting back and forth to the clinic.  We will try to arrange for her to have home monitoring/testing in order to avoid going back into the clinic.  She does not have a license, and is fearful of riding with her roommate.  Otherwise she would need to get public transportation.      TIA (transient ischemic attack) (Chronic)    No recurrent episodes since 2010.  Remains on warfarin plus aspirin.  No active bleeding issues.      Relevant Medications   diltiazem (CARDIZEM CD) 240 MG 24 hr capsule   warfarin (COUMADIN) 2.5 MG tablet      Current medicines are reviewed at length with the patient today. (+/- concerns) n/a The following changes have been made: n/a  Patient Instructions  Your physician wants you to follow-up in: 6 MONTHS WITH DR Herbie Baltimore You will receive a reminder letter in the mail two months in advance. If you don't receive a letter, please call our office to schedule the follow-up  appointment.   If you need a refill on your cardiac medications before your next appointment, please call your pharmacy.    Studies Ordered:   No orders of the defined types were placed in this encounter.     Bryan Lemma, M.D., M.S. Interventional Cardiologist   Pager #  [phone] Phone # [phone] [address] [place], [place] [address]

## 2017-04-09 LAB — PROTIME-INR
INR: 2.9 — ABNORMAL HIGH
PROTHROMBIN TIME: 30.3 s — AB (ref 9.0–11.5)

## 2017-04-10 ENCOUNTER — Encounter: Payer: Self-pay | Admitting: Cardiology

## 2017-04-11 NOTE — Assessment & Plan Note (Signed)
Seems euvolemic.  No significant edema today with Lasix.  Not having to take any additional doses.

## 2017-04-11 NOTE — Assessment & Plan Note (Signed)
Well-controlled with diltiazem and furosemide

## 2017-04-11 NOTE — Assessment & Plan Note (Signed)
No recurrent episodes since 2010.  Remains on warfarin plus aspirin.  No active bleeding issues.

## 2017-04-11 NOTE — Assessment & Plan Note (Signed)
Persistent/permanent A. fib with now simply rate control using diltiazem.  No real rapid spells that are prolonged.  She seems to be under control them with slowing down her activity level.  Otherwise relatively astigmatic.  No bleeding issues with warfarin.

## 2017-04-11 NOTE — Assessment & Plan Note (Signed)
Follows up with her INR is here on warfarin with no complaints.  The biggest concern she has is getting back and forth to the clinic.  We will try to arrange for her to have home monitoring/testing in order to avoid going back into the clinic.  She does not have a license, and is fearful of riding with her roommate.  Otherwise she would need to get public transportation.

## 2017-04-19 ENCOUNTER — Encounter: Payer: Self-pay | Admitting: Family Medicine

## 2017-04-19 ENCOUNTER — Ambulatory Visit (INDEPENDENT_AMBULATORY_CARE_PROVIDER_SITE_OTHER): Payer: Medicare Other | Admitting: Family Medicine

## 2017-04-19 ENCOUNTER — Other Ambulatory Visit: Payer: Self-pay

## 2017-04-19 VITALS — BP 126/80 | HR 80 | Temp 97.9°F | Resp 18 | Ht 62.5 in | Wt 202.0 lb

## 2017-04-19 DIAGNOSIS — R5383 Other fatigue: Secondary | ICD-10-CM | POA: Diagnosis not present

## 2017-04-19 DIAGNOSIS — E039 Hypothyroidism, unspecified: Secondary | ICD-10-CM | POA: Diagnosis not present

## 2017-04-19 DIAGNOSIS — M79645 Pain in left finger(s): Secondary | ICD-10-CM | POA: Diagnosis not present

## 2017-04-19 MED ORDER — LEVOTHYROXINE SODIUM 100 MCG PO TABS: 100.0000 ug | ORAL_TABLET | Freq: Every day | ORAL | 3 refills | Status: DC

## 2017-04-19 NOTE — Progress Notes (Signed)
Subjective:  By signing my name below, I, [name], attest that this documentation has been prepared under the direction and in the presence of [name], MD Electronically Signed: Charline Bills, ED Scribe 04/19/2017 at 4:23 PM.   Patient ID: [name], female    DOB: Aug 28, 1940, 76 y.o.   MRN: [id number]  Chief Complaint  Patient presents with  . Medication Refill    patient requesting refills for thyroid medication, states that she does not want [name]   HPI [name] is a 76 y.o. female who presents to Primary Care at [facility] for medication refill. Seen for cough in August. H/o HTN, hypothyroidism, a-fib, COPD. Here for refill of synthroid 100 mcg qd. Previous PCP was Dr. Susann Givens. Pt has been compliant and states she has been on the same dose for a while.  Lab Results  Component Value Date   TSH 2.173 01/09/2015   Cough Pt has not been using albuterol inhaler. States she is still fatigued and reports difficulty sleeping as she wakes up 2-3 times during the night to urinate at times but she is unsure if she's truly fatigued or bored. Also reports feeling sob once in a while but not too often. She did start taking Vitamin C 1.5 week ago and has noticed very minimal improvement. Denies cp. No h/o anemia.   L Thumb Pain Pt reports intermittent L thumb pain x 1 year. Denies fall/injury. She states L thumb only hurts while playing cards and shuffling the deck. Pt is L hand dominant. She has occasionally tried Advil for pain but not consistently.  Patient Active Problem List   Diagnosis Date Noted  . Long-term (current) use of anticoagulants 10/10/2014  . Atrial fibrillation, chronic (HCC): CHA2DS2Vasc = 4, on Warfarin; CCB for Rate control 05/01/2013  . Bilateral lower extremity edema 05/01/2013  . Obesity (BMI 35.0-39.9 without comorbidity) -  02/26/2013  . Seasonal allergies 02/26/2013  . Long term current use of anticoagulant therapy 08/28/2012  . TIA (transient  ischemic attack) 02/24/2012  . Hypothyroidism 02/05/2010  . Essential hypertension 02/05/2010   Past Medical History:  Diagnosis Date  . Atrial fibrillation, chronic (HCC): CHA2DS2Vasc = 4, on Warfarin; CCB for Rate control 05/01/2013   Previously on Flecainide - stopped due to recurrent Afib -- no longer effective. Did not tolerate Multaq.   This patients CHA2DS2-VASc Score and unadjusted Ischemic Stroke Rate (% per year) is equal to 4.8 % stroke rate/year from a score of 4 (HTN, Female, h/o TIA)    . Chronic fatigue   . Chronic pain   . COPD (chronic obstructive pulmonary disease) (HCC)   . Dyspnea on exertion    A.) Lexican Mycardial Perfusion with Wall Motion LVEF, Date:09/07/2011, TID ratio 1.03, lung to heart ratio 26%.  Marland Kitchen Hypothyroidism   . Obesity   . Seasonal allergies   . Stroke (HCC)   . Thyroid disease    HYPOTHYROID  . TIA (transient ischemic attack) 2010   A.) Carotid Duplex study, 10/08/2008, minimal disease   Past Surgical History:  Procedure Laterality Date  . 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 NM MYOVIEW LTD  April 2013   No evidence of ischemia or infarction. LOW RISK;  normal EF  . TONSILLECTOMY    . TOTAL HIP ARTHROPLASTY  [id number]  . TRANSTHORACIC ECHOCARDIOGRAM  09-2012   Moderate concentric LVH. EF 60-65%.; Mild LA dilation.   Allergies  Allergen Reactions  . Amiodarone Diarrhea  Prior to Admission medications   Medication Sig Start Date End Date Taking? Authorizing Provider  albuterol (PROVENTIL HFA;VENTOLIN HFA) 108 (90 Base) MCG/ACT inhaler Inhale 2 puffs into the lungs every 4 (four) hours as needed for wheezing or shortness of breath (cough, shortness of breath or wheezing.). 01/25/17  Yes McVey, Madelaine Bhat, PA-C  diltiazem (CARDIZEM CD) 240 MG 24 hr capsule Take 1 capsule (240 mg total) by mouth daily. 04/08/17  Yes Marykay Lex, MD  furosemide (LASIX) 40 MG tablet TAKE 1 TABLET BY MOUTH  DAILY 02/16/17  Yes Marykay Lex,  MD  Ibuprofen (ADVIL) 200 MG CAPS Take 2 capsules by mouth at bedtime.   Yes [provider]  levothyroxine (SYNTHROID, LEVOTHROID) 100 MCG tablet Take 1 tablet (100 mcg total) by mouth daily. 07/05/16  Yes Ronnald Nian, MD  warfarin (COUMADIN) 2.5 MG tablet Take 1 tablet (2.5 mg total) by mouth daily. 04/08/17  Yes Marykay Lex, MD   Social History   Socioeconomic History  . Marital status: Divorced    Spouse name: Not on file  . Number of children: Not on file  . Years of education: Not on file  . Highest education level: Not on file  Social Needs  . Financial resource strain: Not on file  . Food insecurity - worry: Not on file  . Food insecurity - inability: Not on file  . Transportation needs - medical: Not on file  . Transportation needs - non-medical: Not on file  Occupational History  . Not on file  Tobacco Use  . Smoking status: Former Smoker    Packs/day: 2.00    Last attempt to quit: 06/07/2004    Years since quitting: 12.8  . Smokeless tobacco: Never Used  Substance and Sexual Activity  . Alcohol use: No    Comment: occasionally  . Drug use: No  . Sexual activity: Not Currently  Other Topics Concern  . Not on file  Social History Narrative   She is divorced, and is accompanied by her long-time companion/friend who is always with her. They are both transplants from [place] (she is from [place]).   She is a former smoker, who quit in 2006. She claims to never have "inhaled."   She does not get routine exercise but she and her friend are very active.   Review of Systems  Constitutional: Positive for fatigue.  Respiratory: Positive for cough and shortness of breath.   Cardiovascular: Negative for chest pain.  Musculoskeletal: Positive for arthralgias.  Psychiatric/Behavioral: Positive for sleep disturbance.      Objective:   Physical Exam  Constitutional: She is oriented to person, place, and time. She appears well-developed and well-nourished.    HENT:  Head: Normocephalic and atraumatic.  Eyes: Conjunctivae and EOM are normal. Pupils are equal, round, and reactive to light.  Neck: Carotid bruit is not present.  Cardiovascular: Normal heart sounds and intact distal pulses.  Overall rate is controlled but irregular rhythm  Pulmonary/Chest: Effort normal and breath sounds normal.  Abdominal: Soft. She exhibits no pulsatile midline mass. There is no tenderness.  Musculoskeletal: She exhibits edema (1+ pitting edema to 1/3 tibia bilaterally).  L thumb: complains of pain at the [facility] of the L hand. Tender along the [facility]. Slight bony prominence of the joint. The IP is unaffected. Slight decreased flexion of the L thumb compared to the R thumb. Intact extension but pain.  Neurological: She is alert and oriented to person, place, and time.  Skin: Skin is warm and dry.  Psychiatric: She has a normal mood and affect. Her behavior is normal.  Vitals reviewed.  Vitals:   04/19/17 1553  BP: 126/80  Pulse: 80  Resp: 18  Temp: 97.9 F (36.6 C)  TempSrc: Oral  SpO2: 96%  Weight: 202 lb (91.6 kg)  Height: 5' 2.5" (1.588 m)      Assessment & Plan:    Erika CompJean E Lafosse is a 76 y.o. female Hypothyroidism, unspecified type - Plan: TSH, DISCONTINUED: levothyroxine (SYNTHROID, LEVOTHROID) 100 MCG tablet  - Check TSH, continue same dose Synthroid for now.   Fatigue, unspecified type - Plan: TSH, CBC, Basic metabolic panel  -May be multifactorial. Denies chest pain. History of cough as above, but lungs clear. She is on anticoagulation, but no known history of anemia. No concerning findings on exam.  - Check CBC, BMP, TSH as above, then follow-up to discuss symptoms further if persistent. RTC precautions/ER if worsening and could also discussed the symptoms with her cardiologist.  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 Offered x-ray and further evaluation declined at present. Tylenol discussed for intermittent symptoms, if  persistent pain, would recommend return for x-rays and discussion of other treatments or possible referral to hand specialist.  Of note she did point out to me at the end of the visit a possible contracture of her left hand, but stated it is not bothering her. Asked that she return to discuss that further as well as any other specific concerns  To be able to further address those issues. Meds ordered this encounter  Medications  . DISCONTD: levothyroxine (SYNTHROID, LEVOTHROID) 100 MCG tablet    Sig: Take 1 tablet (100 mcg total) daily by mouth.    Dispense:  90 tablet    Refill:  3   Patient Instructions    I will check your thyroid test, no change in dose of meds for now. Fatigue should improve with time after prior cough, but I will check for anemia and other electrolytes today.   If any cough or wheezing - can use albuterol inhaler. If any chest symptoms, would discuss with your cardiologist.    Make sure to drink plenty of water.   If you have difficulty with sleep, follow up to discuss those symptoms further.   For thumb pain, try tylenol up to 4 times per day, and follow up in next few weeks for possible xrays and to look at that area further. I would recommend against using alleve or advil frequently or long term due to possible heart risks.  Please follow up to discuss any other concerns that were not addressed during your visit today. Thank you for coming in.   Return to the clinic or go to the nearest emergency room if any of your symptoms worsen or new symptoms occur.   Fatigue Fatigue is feeling tired all of the time, a lack of energy, or a lack of motivation. Occasional or mild fatigue is often a normal response to activity or life in general. However, long-lasting (chronic) or extreme fatigue may indicate an underlying medical condition. Follow these instructions at home: Watch your fatigue for any changes. The following actions may help to lessen any discomfort you are  feeling:  Talk to your health care provider about how much sleep you need each night. Try to get the required amount every night.  Take medicines only as directed by your health care provider.  Eat a healthy and nutritious diet.  Ask your health care provider if you need help changing your diet.  Drink enough fluid to keep your urine clear or pale yellow.  Practice ways of relaxing, such as yoga, meditation, massage therapy, or acupuncture.  Exercise regularly.  Change situations that cause you stress. Try to keep your work and personal routine reasonable.  Do not abuse illegal drugs.  Limit alcohol intake to no more than 1 drink per day for nonpregnant women and 2 drinks per day for men. One drink equals 12 ounces of beer, 5 ounces of wine, or 1 ounces of hard liquor.  Take a multivitamin, if directed by your health care provider.  Contact a health care provider if:  Your fatigue does not get better.  You have a fever.  You have unintentional weight loss or gain.  You have headaches.  You have difficulty: ? Falling asleep. ? Sleeping throughout the night.  You feel angry, guilty, anxious, or sad.  You are unable to have a bowel movement (constipation).  You skin is dry.  Your legs or another part of your body is swollen. Get help right away if:  You feel confused.  Your vision is blurry.  You feel faint or pass out.  You have a severe headache.  You have severe abdominal, pelvic, or back pain.  You have chest pain, shortness of breath, or an irregular or fast heartbeat.  You are unable to urinate or you urinate less than normal.  You develop abnormal bleeding, such as bleeding from the rectum, vagina, nose, lungs, or nipples.  You vomit blood.  You have thoughts about harming yourself or committing suicide.  You are worried that you might harm someone else. This information is not intended to replace advice given to you by your health care provider.  Make sure you discuss any questions you have with your health care provider. Document Released: 03/21/2007 Document Revised: 10/30/2015 Document Reviewed: 09/25/2013 Elsevier Interactive Patient Education  2018 [employer]    IF you received an x-ray today, you will receive an invoice from [facility]. Please contact [facility] at [phone] with questions or concerns regarding your invoice.   IF you received labwork today, you will receive an invoice from [facility]. Please contact [facility] at [phone] with questions or concerns regarding your invoice.   Our billing staff will not be able to assist you with questions regarding bills from these companies.  You will be contacted with the lab results as soon as they are available. The fastest way to get your results is to activate your My Chart account. Instructions are located on the last page of this paperwork. If you have not heard from us regarding the results in 2 weeks, please contact this office.      I personally performed the services described in this documentation, which was scribed in my presence. The recorded information has been reviewed and considered for accuracy and completeness, addended by me as needed, and agree with information above.  Signed,   Meredith StaggersJeffrey Juvia Aerts, MD Primary Care at [facility].  04/20/17 10:34 PM

## 2017-04-19 NOTE — Patient Instructions (Addendum)
I will check your thyroid test, no change in dose of meds for now. Fatigue should improve with time after prior cough, but I will check for anemia and other electrolytes today.   If any cough or wheezing - can use albuterol inhaler. If any chest symptoms, would discuss with your cardiologist.    Make sure to drink plenty of water.   If you have difficulty with sleep, follow up to discuss those symptoms further.   For thumb pain, try tylenol up to 4 times per day, and follow up in next few weeks for possible xrays and to look at that area further. I would recommend against using alleve or advil frequently or long term due to possible heart risks.  Please follow up to discuss any other concerns that were not addressed during your visit today. Thank you for coming in.   Return to the clinic or go to the nearest emergency room if any of your symptoms worsen or new symptoms occur.   Fatigue Fatigue is feeling tired all of the time, a lack of energy, or a lack of motivation. Occasional or mild fatigue is often a normal response to activity or life in general. However, long-lasting (chronic) or extreme fatigue may indicate an underlying medical condition. Follow these instructions at home: Watch your fatigue for any changes. The following actions may help to lessen any discomfort you are feeling:  Talk to your health care provider about how much sleep you need each night. Try to get the required amount every night.  Take medicines only as directed by your health care provider.  Eat a healthy and nutritious diet. Ask your health care provider if you need help changing your diet.  Drink enough fluid to keep your urine clear or pale yellow.  Practice ways of relaxing, such as yoga, meditation, massage therapy, or acupuncture.  Exercise regularly.  Change situations that cause you stress. Try to keep your work and personal routine reasonable.  Do not abuse illegal drugs.  Limit alcohol  intake to no more than 1 drink per day for nonpregnant women and 2 drinks per day for men. One drink equals 12 ounces of beer, 5 ounces of wine, or 1 ounces of hard liquor.  Take a multivitamin, if directed by your health care provider.  Contact a health care provider if:  Your fatigue does not get better.  You have a fever.  You have unintentional weight loss or gain.  You have headaches.  You have difficulty: ? Falling asleep. ? Sleeping throughout the night.  You feel angry, guilty, anxious, or sad.  You are unable to have a bowel movement (constipation).  You skin is dry.  Your legs or another part of your body is swollen. Get help right away if:  You feel confused.  Your vision is blurry.  You feel faint or pass out.  You have a severe headache.  You have severe abdominal, pelvic, or back pain.  You have chest pain, shortness of breath, or an irregular or fast heartbeat.  You are unable to urinate or you urinate less than normal.  You develop abnormal bleeding, such as bleeding from the rectum, vagina, nose, lungs, or nipples.  You vomit blood.  You have thoughts about harming yourself or committing suicide.  You are worried that you might harm someone else. This information is not intended to replace advice given to you by your health care provider. Make sure you discuss any questions you have with your health  care provider. Document Released: 03/21/2007 Document Revised: 10/30/2015 Document Reviewed: 09/25/2013 Elsevier Interactive Patient Education  2018 [employer]    IF you received an x-ray today, you will receive an invoice from [facility]. Please contact [facility] at [phone] with questions or concerns regarding your invoice.   IF you received labwork today, you will receive an invoice from [facility]. Please contact [facility] at [phone] with questions or concerns regarding your invoice.   Our billing staff will not  be able to assist you with questions regarding bills from these companies.  You will be contacted with the lab results as soon as they are available. The fastest way to get your results is to activate your My Chart account. Instructions are located on the last page of this paperwork. If you have not heard from us regarding the results in 2 weeks, please contact this office.

## 2017-04-20 ENCOUNTER — Other Ambulatory Visit: Payer: Self-pay | Admitting: *Deleted

## 2017-04-20 ENCOUNTER — Telehealth: Payer: Self-pay | Admitting: Family Medicine

## 2017-04-20 ENCOUNTER — Encounter: Payer: Self-pay | Admitting: Family Medicine

## 2017-04-20 DIAGNOSIS — E039 Hypothyroidism, unspecified: Secondary | ICD-10-CM

## 2017-04-20 LAB — CBC
HEMATOCRIT: 38.9 % (ref 34.0–46.6)
Hemoglobin: 12.8 g/dL (ref 11.1–15.9)
MCH: 30.2 pg (ref 26.6–33.0)
MCHC: 32.9 g/dL (ref 31.5–35.7)
MCV: 92 fL (ref 79–97)
PLATELETS: 335 10*3/uL (ref 150–379)
RBC: 4.24 x10E6/uL (ref 3.77–5.28)
RDW: 13.7 % (ref 12.3–15.4)
WBC: 6.8 10*3/uL (ref 3.4–10.8)

## 2017-04-20 LAB — BASIC METABOLIC PANEL
BUN / CREAT RATIO: 9 — AB (ref 12–28)
BUN: 9 mg/dL (ref 8–27)
CHLORIDE: 100 mmol/L (ref 96–106)
CO2: 29 mmol/L (ref 20–29)
Calcium: 9.6 mg/dL (ref 8.7–10.3)
Creatinine, Ser: 0.97 mg/dL (ref 0.57–1.00)
GFR, EST AFRICAN AMERICAN: 66 mL/min/{1.73_m2} (ref >59)
GFR, EST NON AFRICAN AMERICAN: 57 mL/min/{1.73_m2} — AB (ref >59)
Glucose: 81 mg/dL (ref 65–99)
POTASSIUM: 3.6 mmol/L (ref 3.5–5.2)
SODIUM: 141 mmol/L (ref 134–144)

## 2017-04-20 LAB — TSH: TSH: 0.994 u[IU]/mL (ref 0.450–4.500)

## 2017-04-20 MED ORDER — LEVOTHYROXINE SODIUM 100 MCG PO TABS: 100.0000 ug | ORAL_TABLET | Freq: Every day | ORAL | 3 refills | Status: DC

## 2017-04-20 NOTE — Telephone Encounter (Signed)
Copied from CRM #[id number]. Topic: Inquiry >> Apr 20, 2017  9:30 AM Windy KalataMichael, Jamion Carter L, NT wrote: Reason for CRM: pt states she was in the office yesterday and seen Dr. Chilton SiGreen; pt states she told him to send RX for Levothyroxine to [facility] and instead it was sent to [facility]. Pt states she will need it to be sent over and would like a call back when it has been sent over.

## 2017-04-20 NOTE — Telephone Encounter (Signed)
Preferred pharmacy updated for Levothyroxine for patient.

## 2017-05-09 ENCOUNTER — Other Ambulatory Visit: Payer: Self-pay | Admitting: Cardiology

## 2017-05-09 DIAGNOSIS — I482 Chronic atrial fibrillation: Secondary | ICD-10-CM | POA: Diagnosis not present

## 2017-05-09 DIAGNOSIS — I4891 Unspecified atrial fibrillation: Secondary | ICD-10-CM | POA: Diagnosis not present

## 2017-05-10 LAB — PROTIME-INR
INR: 3.6 — ABNORMAL HIGH
Prothrombin Time: 37.2 s — ABNORMAL HIGH (ref 9.0–11.5)

## 2017-05-20 ENCOUNTER — Ambulatory Visit (INDEPENDENT_AMBULATORY_CARE_PROVIDER_SITE_OTHER): Payer: Medicare Other | Admitting: Pharmacist Clinician (PhC)/ Clinical Pharmacy Specialist

## 2017-05-20 DIAGNOSIS — I482 Chronic atrial fibrillation, unspecified: Secondary | ICD-10-CM

## 2017-05-20 DIAGNOSIS — Z7901 Long term (current) use of anticoagulants: Secondary | ICD-10-CM

## 2017-05-23 ENCOUNTER — Telehealth: Payer: Self-pay

## 2017-05-23 NOTE — Telephone Encounter (Signed)
Pt returning call to schedule AWV. Pt declines to schedule this at this time.    Sherle PoeNicole Vernor Monnig, B.A.  Care Guide - [facility] at [facility] [phone]

## 2017-07-12 ENCOUNTER — Other Ambulatory Visit: Payer: Self-pay | Admitting: Cardiology

## 2017-07-12 DIAGNOSIS — I4891 Unspecified atrial fibrillation: Secondary | ICD-10-CM | POA: Diagnosis not present

## 2017-07-12 DIAGNOSIS — I482 Chronic atrial fibrillation: Secondary | ICD-10-CM | POA: Diagnosis not present

## 2017-07-13 LAB — PROTIME-INR
INR: 3.5 — ABNORMAL HIGH
PROTHROMBIN TIME: 36.3 s — AB (ref 9.0–11.5)

## 2017-07-20 ENCOUNTER — Ambulatory Visit (INDEPENDENT_AMBULATORY_CARE_PROVIDER_SITE_OTHER): Payer: Medicare Other | Admitting: Pharmacist

## 2017-07-20 DIAGNOSIS — I482 Chronic atrial fibrillation, unspecified: Secondary | ICD-10-CM

## 2017-07-20 DIAGNOSIS — Z7901 Long term (current) use of anticoagulants: Secondary | ICD-10-CM | POA: Diagnosis not present

## 2017-08-04 ENCOUNTER — Telehealth: Payer: Self-pay | Admitting: Cardiology

## 2017-08-04 NOTE — Telephone Encounter (Signed)
Returned call - patient has been feeling lightheaded recently, she wondered if this was due to elevated INR.  Assured her it was not, but if it continues she should check in with her PCP.  Patient voiced understanding.

## 2017-08-04 NOTE — Telephone Encounter (Signed)
New Message   Patient is requesting a call from you. She did not want to provide much information other than saying it about her blood.

## 2017-08-08 ENCOUNTER — Other Ambulatory Visit: Payer: Self-pay | Admitting: Cardiology

## 2017-08-08 DIAGNOSIS — I482 Chronic atrial fibrillation: Secondary | ICD-10-CM | POA: Diagnosis not present

## 2017-08-08 DIAGNOSIS — I4891 Unspecified atrial fibrillation: Secondary | ICD-10-CM | POA: Diagnosis not present

## 2017-08-08 DIAGNOSIS — Z7901 Long term (current) use of anticoagulants: Secondary | ICD-10-CM | POA: Diagnosis not present

## 2017-08-09 LAB — PROTIME-INR
INR: 4.3 — ABNORMAL HIGH
Prothrombin Time: 45.1 s — ABNORMAL HIGH (ref 9.0–11.5)

## 2017-08-22 ENCOUNTER — Ambulatory Visit (INDEPENDENT_AMBULATORY_CARE_PROVIDER_SITE_OTHER): Payer: Medicare Other | Admitting: Pharmacist Clinician (PhC)/ Clinical Pharmacy Specialist

## 2017-08-22 DIAGNOSIS — I482 Chronic atrial fibrillation, unspecified: Secondary | ICD-10-CM

## 2017-08-26 ENCOUNTER — Other Ambulatory Visit: Payer: Self-pay | Admitting: Cardiology

## 2017-08-26 NOTE — Telephone Encounter (Signed)
REFILL 

## 2017-09-08 ENCOUNTER — Other Ambulatory Visit: Payer: Self-pay | Admitting: Cardiology

## 2017-09-08 DIAGNOSIS — I4891 Unspecified atrial fibrillation: Secondary | ICD-10-CM | POA: Diagnosis not present

## 2017-09-08 DIAGNOSIS — I482 Chronic atrial fibrillation: Secondary | ICD-10-CM | POA: Diagnosis not present

## 2017-09-08 LAB — PROTIME-INR: INR: 2.7 — AB (ref 0.9–1.1)

## 2017-09-09 LAB — PROTIME-INR
INR: 2.7 — ABNORMAL HIGH
Prothrombin Time: 28.4 s — ABNORMAL HIGH (ref 9.0–11.5)

## 2017-09-15 ENCOUNTER — Ambulatory Visit (INDEPENDENT_AMBULATORY_CARE_PROVIDER_SITE_OTHER): Payer: Medicare Other | Admitting: Pharmacist

## 2017-09-15 DIAGNOSIS — I482 Chronic atrial fibrillation, unspecified: Secondary | ICD-10-CM

## 2017-10-05 ENCOUNTER — Other Ambulatory Visit: Payer: Self-pay | Admitting: Cardiology

## 2017-10-05 ENCOUNTER — Ambulatory Visit: Payer: Medicare Other | Admitting: Cardiology

## 2017-10-05 ENCOUNTER — Encounter: Payer: Self-pay | Admitting: Cardiology

## 2017-10-05 VITALS — BP 118/80 | HR 74 | Ht 62.0 in | Wt 195.8 lb

## 2017-10-05 DIAGNOSIS — Z7901 Long term (current) use of anticoagulants: Secondary | ICD-10-CM | POA: Diagnosis not present

## 2017-10-05 DIAGNOSIS — I482 Chronic atrial fibrillation, unspecified: Secondary | ICD-10-CM

## 2017-10-05 DIAGNOSIS — I10 Essential (primary) hypertension: Secondary | ICD-10-CM | POA: Diagnosis not present

## 2017-10-05 DIAGNOSIS — E669 Obesity, unspecified: Secondary | ICD-10-CM

## 2017-10-05 DIAGNOSIS — R600 Localized edema: Secondary | ICD-10-CM

## 2017-10-05 DIAGNOSIS — I4891 Unspecified atrial fibrillation: Secondary | ICD-10-CM | POA: Diagnosis not present

## 2017-10-05 DIAGNOSIS — G459 Transient cerebral ischemic attack, unspecified: Secondary | ICD-10-CM

## 2017-10-05 NOTE — Progress Notes (Signed)
PCP: [name], MD  Clinic Note: Chief Complaint  Sharp presents with  . Follow-up    pt complains of SOB, fatigue, some slight chest pain, swelling in ankles  . Atrial Fibrillation    HPI: [name] is a 77 y.o. female with a PMH below who presents today for six-month follow-up for long-standing atrial fibrillation and mild diastolic dysfunction.[name] was last seen on April 08, 2017.  She just recovered from a bout of bronchitis back in August.  This was triggered by allergies.  She pretty much was unaware of any A. fib and had minimal edema controlled by Lasix.  Recent Hospitalizations: n/a  Studies Personally Reviewed - (if available, images/films reviewed: From Epic Chart or Care Everywhere)  N/a   Interval History: [name] returns today again still oblivious to being in or out of A. fib.  She just states that she has allowed herself to be quite conditioned for being sedentary.  She says that her legs give out on her whenever she starts to try to do things.  Her balance is off, and she is had some falls recently all because of poor balance or slipping.    She denies any lightheadedness or dizziness. No syncope or near syncope.  No TIA or amaurosis fugax symptoms.  Probably because she does not exert herself she does have some exertional dyspnea, no chest pain or pressure.   No PND, orthopnea with mild ankle edema usually controlled by diuretic.  No melena, hematochezia, hematuria, or epstaxis.  She is starting to get tired INR checks.  Is thought to consider--switching over to a DOAC No claudication.  ROS: A comprehensive was performed. Review of Systems  Constitutional: Positive for malaise/fatigue (Sedentary).  HENT: Positive for congestion. Negative for sore throat.   Respiratory: Positive for cough, shortness of breath (Baseline, exacerbated by allergies) and wheezing (Allergies).   Gastrointestinal: Negative for heartburn, nausea and vomiting.    Musculoskeletal: Positive for joint pain (Knees hips and ankles).  Neurological: Positive for dizziness (Positional if she does not eat or drink enough; vertigo, but recent episode) and weakness (Both legs).  Endo/Heme/Allergies: Positive for environmental allergies.  Psychiatric/Behavioral: Positive for depression (Still has lots of social stress.  Her roommate has worsening dementia and this is causing quite a lot of trouble.  She does not have any other friends locally.  Her closest friend is in [place].). Negative for memory loss. [name] has insomnia.   All other systems reviewed and are negative.  I have reviewed and (if needed) personally updated [name]'s problem list, medications, allergies, past medical and surgical history, social and family history.   Past Medical History:  Diagnosis Date  . Atrial fibrillation, chronic (HCC): CHA2DS2Vasc = 4, on Warfarin; CCB for Rate control 05/01/2013   Previously on Flecainide - stopped due to recurrent Afib -- no longer effective. Did not tolerate Multaq.   This patients CHA2DS2-VASc Score and unadjusted Ischemic Stroke Rate (% per year) is equal to 4.8 % stroke rate/year from a score of 4 (HTN, Female, h/o TIA)    . Chronic fatigue   . Chronic pain   . COPD (chronic obstructive pulmonary disease) (HCC)   . Dyspnea on exertion    A.) Lexican Mycardial Perfusion with Wall Motion LVEF, Date:09/07/2011, TID ratio 1.03, lung to heart ratio 26%.  Marland Kitchen Hypothyroidism   . Obesity   . Seasonal allergies   . Stroke (HCC)   . Thyroid disease  HYPOTHYROID  . TIA (transient ischemic attack) 2010   A.) Carotid Duplex study, 10/08/2008, minimal disease    Past Surgical History:  Procedure Laterality Date  . CARDIOVERSION N/A 08/16/2012   Procedure: CARDIOVERSION;  Surgeon: Marykay Lex, MD;  Location: [facility] ENDOSCOPY;  Service: Endoscopy;  Laterality: N/A;  labs on arrival   . CARDIOVERSION N/A 09/14/2012   Procedure: CARDIOVERSION;   Surgeon: Chrystie Nose, MD;  Location: [facility] ENDOSCOPY;  Service: Cardiovascular;  Laterality: N/A;  . 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 NM MYOVIEW LTD  April 2013   No evidence of ischemia or infarction. LOW RISK;  normal EF  . TONSILLECTOMY    . TOTAL HIP ARTHROPLASTY  [id number]  . TRANSTHORACIC ECHOCARDIOGRAM  09-2012   Moderate concentric LVH. EF 60-65%.; Mild LA dilation.    Current Meds  Medication Sig  . albuterol (PROVENTIL HFA;VENTOLIN HFA) 108 (90 Base) MCG/ACT inhaler Inhale 2 puffs into Erika lungs every 4 (four) hours as needed for wheezing or shortness of breath (cough, shortness of breath or wheezing.).  Marland Kitchen diltiazem (CARDIZEM CD) 240 MG 24 hr capsule Take 1 capsule (240 mg total) by mouth daily.  . furosemide (LASIX) 40 MG tablet TAKE 1 TABLET BY MOUTH  DAILY  . levothyroxine (SYNTHROID, LEVOTHROID) 100 MCG tablet Take 1 tablet (100 mcg total) daily by mouth.  . warfarin (COUMADIN) 2.5 MG tablet Take 1 tablet (2.5 mg total) by mouth daily.    Allergies  Allergen Reactions  . Amiodarone Diarrhea    Social History   Tobacco Use  . Smoking status: Former Smoker    Packs/day: 2.00    Last attempt to quit: 06/07/2004    Years since quitting: 13.3  . Smokeless tobacco: Never Used  Substance Use Topics  . Alcohol use: No    Comment: occasionally  . Drug use: No   Social History   Social History Narrative   She is divorced, and is accompanied by her long-time companion/friend who is always with her. They are both transplants from [place] (she is from [place]).   She is a former smoker, who quit in 2006. She claims to never have "inhaled."   She does not get routine exercise but she and her friend are very active.    family history includes Alcoholism in her brother and father; Healthy in her sister.  Wt Readings from Last 3 Encounters:  10/05/17 195 lb 12.8 oz (88.8 kg)  04/19/17 202 lb (91.6 kg)  04/08/17 198 lb 12.8 oz (90.2 kg)    PHYSICAL  EXAM BP 118/80 (BP Location: Left Arm)   Pulse 74   Ht  (1.575 m)   Wt 195 lb 12.8 oz (88.8 kg)   BMI 35.81 kg/m  Physical Exam  Constitutional: She is oriented to person, place, and time. She appears well-developed and well-nourished. No distress.  Well-groomed.  Relatively healthy appearing  HENT:  Head: Normocephalic and atraumatic.  Neck: No hepatojugular reflux and no JVD present. Carotid bruit is not present.  Cardiovascular: Normal rate and normal pulses. An irregularly irregular rhythm present. PMI is not displaced. Exam reveals no gallop and no friction rub.  Murmur heard.  Medium-pitched blowing decrescendo holosystolic murmur is present at Erika lower left sternal border and apex. Pulmonary/Chest: Effort normal. No respiratory distress. She has wheezes (Mild baseline wheeze.  No rhonchi). She has no rales.  Abdominal: Soft. Bowel sounds are normal. She exhibits no distension. There is no tenderness.  No HSM  Musculoskeletal: Normal range of motion. She exhibits edema (Trivial).  Neurological: She is alert and oriented to person, place, and time.  Psychiatric: She has a normal mood and affect. Her behavior is normal. Judgment and thought content normal.  Vitals reviewed.    Adult ECG Report  Rate: 74;  Rhythm: atrial fibrillation and With PVCs/aberrantly conducted beats.  Otherwise nonspecific ST-T wave changes.;   Narrative Interpretation: Stable EKG   Other studies Reviewed: Additional studies/ records that were reviewed today include:  Recent Labs: Labs are followed by PCP, but not available..   ASSESSMENT / PLAN:   Overall Kenishia is quite stable.  No change to regimen besides allowing for more free use of as needed Lasix. She really needs to figure out fix for her living situation.  It is very sad that her roommate and wants close friend is now such a cream on her.  Unfortunately she is trapped in his living situation, because Erika house visit her friends name, and  she does not have a car or house.   Problem List Items Addressed This Visit    TIA (transient ischemic attack) (Chronic)    No recurrence on warfarin.  Aspirin stopped years ago..      Obesity (BMI 35.0-39.9 without comorbidity) -  (Chronic)   Long term current use of anticoagulant therapy (Chronic)    She will have her INR checked here today.  Consider switching to DOAC      Essential hypertension (Chronic)    Well-controlled on diltiazem and furosemide.      Bilateral lower extremity edema (Chronic)    Seems pretty euvolemic with mild trace edema.  Is on standing dose of Lasix and not really have been taking additional dose.  Has had some days with "when she puts her feet of Erika usually go down. -- I did indicate that she can take additional doses as needed.      Atrial fibrillation, chronic (HCC): CHA2DS2Vasc = 4, on Warfarin; CCB for Rate control - Primary (Chronic)    For long time she was on flecainide, but now because of some reduction in EF and heart failure, she is simply under control with diltiazem.  Flecainide was not adequate for maintaining sinus rhythm.  She did not tolerate Multaq. Remains on warfarin for now, but she would potentially be interested in switching to DOAC if her Medicare will cover Erika cost.  Is been very difficult for her to get back and forth to Erika office to get INR checks.      Relevant Orders   EKG 12-Lead (Completed)       I spent a total of 30 minutes with [name] and chart review. >  50% of Erika time was spent in direct Sharp consultation.   Current medicines are reviewed at length with [name] today.  (+/- concerns) n/a - considering DOAC Erika following changes have been made:  N/A  Sharp Instructions  No changes with medication   Your physician wants you to follow-up in 6 month with DR HARDING.You will receive a reminder letter in Erika mail two months in advance. If you don't receive a letter, please call our office to  schedule Erika follow-up appointment.   Marland Kitchen CHECK -ABOUT ELIQUIS    Studies Ordered:   Orders Placed This Encounter  Procedures  . EKG 12-Lead      Bryan Lemma, M.D., M.S. Interventional Cardiologist   Pager # [phone] Phone # [phone] [address]  [place], [place] [address]   Thank you for choosing [facility]!!

## 2017-10-05 NOTE — Patient Instructions (Signed)
No changes with medication   Your physician wants you to follow-up in 6 month with DR HARDING.You will receive a reminder letter in the mail two months in advance. If you don't receive a letter, please call our office to schedule the follow-up appointment.   Marland Kitchen CHECK -ABOUT ELIQUIS

## 2017-10-06 LAB — PROTIME-INR
INR: 3.2 — AB
PROTHROMBIN TIME: 33.5 s — AB (ref 9.0–11.5)

## 2017-10-07 ENCOUNTER — Ambulatory Visit (INDEPENDENT_AMBULATORY_CARE_PROVIDER_SITE_OTHER): Payer: Medicare Other | Admitting: Pharmacist Clinician (PhC)/ Clinical Pharmacy Specialist

## 2017-10-07 DIAGNOSIS — I482 Chronic atrial fibrillation, unspecified: Secondary | ICD-10-CM

## 2017-10-07 DIAGNOSIS — Z7901 Long term (current) use of anticoagulants: Secondary | ICD-10-CM

## 2017-10-08 ENCOUNTER — Encounter: Payer: Self-pay | Admitting: Cardiology

## 2017-10-08 NOTE — Assessment & Plan Note (Signed)
She will have her INR checked here today.  Consider switching to DOAC

## 2017-10-08 NOTE — Assessment & Plan Note (Signed)
For long time she was on flecainide, but now because of some reduction in EF and heart failure, she is simply under control with diltiazem.  Flecainide was not adequate for maintaining sinus rhythm.  She did not tolerate Multaq. Remains on warfarin for now, but she would potentially be interested in switching to DOAC if her Medicare will cover the cost.  Is been very difficult for her to get back and forth to the office to get INR checks.

## 2017-10-08 NOTE — Assessment & Plan Note (Signed)
Seems pretty euvolemic with mild trace edema.  Is on standing dose of Lasix and not really have been taking additional dose.  Has had some days with "when she puts her feet of the usually go down. -- I did indicate that she can take additional doses as needed.

## 2017-10-08 NOTE — Assessment & Plan Note (Signed)
Well-controlled on diltiazem and furosemide.

## 2017-10-08 NOTE — Assessment & Plan Note (Signed)
No recurrence on warfarin.  Aspirin stopped years ago.Marland Kitchen

## 2017-10-12 ENCOUNTER — Other Ambulatory Visit: Payer: Self-pay | Admitting: Cardiology

## 2017-11-10 ENCOUNTER — Other Ambulatory Visit: Payer: Self-pay | Admitting: Cardiology

## 2017-11-10 DIAGNOSIS — I4891 Unspecified atrial fibrillation: Secondary | ICD-10-CM | POA: Diagnosis not present

## 2017-11-10 DIAGNOSIS — I482 Chronic atrial fibrillation: Secondary | ICD-10-CM | POA: Diagnosis not present

## 2017-11-11 LAB — PROTIME-INR
INR: 2.5 — ABNORMAL HIGH
Prothrombin Time: 25.8 s — ABNORMAL HIGH (ref 9.0–11.5)

## 2017-11-24 ENCOUNTER — Telehealth: Payer: Self-pay | Admitting: Cardiology

## 2017-11-24 ENCOUNTER — Ambulatory Visit (INDEPENDENT_AMBULATORY_CARE_PROVIDER_SITE_OTHER): Payer: Medicare Other | Admitting: Pharmacist Clinician (PhC)/ Clinical Pharmacy Specialist

## 2017-11-24 DIAGNOSIS — I482 Chronic atrial fibrillation, unspecified: Secondary | ICD-10-CM

## 2017-11-24 DIAGNOSIS — Z7901 Long term (current) use of anticoagulants: Secondary | ICD-10-CM

## 2017-11-24 NOTE — Telephone Encounter (Signed)
New Message:      Pt is calling in regards to labs she had done 2 weeks ago and has not got any results

## 2017-11-24 NOTE — Telephone Encounter (Signed)
See anticoag track.

## 2017-12-14 ENCOUNTER — Other Ambulatory Visit: Payer: Self-pay | Admitting: Cardiology

## 2017-12-14 DIAGNOSIS — I4891 Unspecified atrial fibrillation: Secondary | ICD-10-CM | POA: Diagnosis not present

## 2017-12-14 DIAGNOSIS — I482 Chronic atrial fibrillation: Secondary | ICD-10-CM | POA: Diagnosis not present

## 2017-12-15 LAB — PROTIME-INR
INR: 3 — ABNORMAL HIGH
PROTHROMBIN TIME: 31.7 s — AB (ref 9.0–11.5)

## 2017-12-26 ENCOUNTER — Ambulatory Visit (INDEPENDENT_AMBULATORY_CARE_PROVIDER_SITE_OTHER): Payer: Medicare Other | Admitting: Pharmacist Clinician (PhC)/ Clinical Pharmacy Specialist

## 2017-12-26 DIAGNOSIS — Z7901 Long term (current) use of anticoagulants: Secondary | ICD-10-CM | POA: Diagnosis not present

## 2017-12-26 DIAGNOSIS — I482 Chronic atrial fibrillation, unspecified: Secondary | ICD-10-CM

## 2018-01-06 ENCOUNTER — Other Ambulatory Visit: Payer: Self-pay | Admitting: Cardiology

## 2018-01-06 DIAGNOSIS — I482 Chronic atrial fibrillation: Secondary | ICD-10-CM | POA: Diagnosis not present

## 2018-01-06 DIAGNOSIS — I4891 Unspecified atrial fibrillation: Secondary | ICD-10-CM | POA: Diagnosis not present

## 2018-01-07 LAB — PROTIME-INR
INR: 2.2 — ABNORMAL HIGH
Prothrombin Time: 23 s — ABNORMAL HIGH (ref 9.0–11.5)

## 2018-01-11 ENCOUNTER — Ambulatory Visit (INDEPENDENT_AMBULATORY_CARE_PROVIDER_SITE_OTHER): Payer: Medicare Other | Admitting: Pharmacist Clinician (PhC)/ Clinical Pharmacy Specialist

## 2018-01-11 DIAGNOSIS — Z7901 Long term (current) use of anticoagulants: Secondary | ICD-10-CM

## 2018-01-11 DIAGNOSIS — I482 Chronic atrial fibrillation, unspecified: Secondary | ICD-10-CM

## 2018-02-08 ENCOUNTER — Other Ambulatory Visit: Payer: Self-pay | Admitting: Cardiology

## 2018-02-08 ENCOUNTER — Other Ambulatory Visit: Payer: Self-pay | Admitting: Family Medicine

## 2018-02-08 DIAGNOSIS — E039 Hypothyroidism, unspecified: Secondary | ICD-10-CM

## 2018-02-08 NOTE — Telephone Encounter (Signed)
Rx request sent to pharmacy.  

## 2018-02-09 ENCOUNTER — Other Ambulatory Visit: Payer: Self-pay | Admitting: Cardiology

## 2018-02-09 DIAGNOSIS — Z23 Encounter for immunization: Secondary | ICD-10-CM | POA: Diagnosis not present

## 2018-02-09 DIAGNOSIS — I4891 Unspecified atrial fibrillation: Secondary | ICD-10-CM | POA: Diagnosis not present

## 2018-02-09 DIAGNOSIS — I482 Chronic atrial fibrillation: Secondary | ICD-10-CM | POA: Diagnosis not present

## 2018-02-10 LAB — PROTIME-INR
INR: 2.5 — ABNORMAL HIGH
Prothrombin Time: 26.3 s — ABNORMAL HIGH (ref 9.0–11.5)

## 2018-03-10 ENCOUNTER — Other Ambulatory Visit: Payer: Self-pay | Admitting: Cardiology

## 2018-03-10 DIAGNOSIS — I4891 Unspecified atrial fibrillation: Secondary | ICD-10-CM | POA: Diagnosis not present

## 2018-03-11 LAB — PROTIME-INR
INR: 3 — AB
PROTHROMBIN TIME: 31 s — AB (ref 9.0–11.5)

## 2018-03-14 ENCOUNTER — Ambulatory Visit (INDEPENDENT_AMBULATORY_CARE_PROVIDER_SITE_OTHER): Payer: Self-pay | Admitting: Pharmacist

## 2018-03-14 DIAGNOSIS — I482 Chronic atrial fibrillation, unspecified: Secondary | ICD-10-CM

## 2018-03-14 DIAGNOSIS — Z7901 Long term (current) use of anticoagulants: Secondary | ICD-10-CM

## 2018-03-20 ENCOUNTER — Ambulatory Visit (INDEPENDENT_AMBULATORY_CARE_PROVIDER_SITE_OTHER): Payer: Medicare Other | Admitting: Pharmacist

## 2018-03-20 DIAGNOSIS — Z7901 Long term (current) use of anticoagulants: Secondary | ICD-10-CM

## 2018-03-20 DIAGNOSIS — I482 Chronic atrial fibrillation, unspecified: Secondary | ICD-10-CM

## 2018-03-27 ENCOUNTER — Other Ambulatory Visit: Payer: Self-pay | Admitting: Cardiology

## 2018-04-06 ENCOUNTER — Telehealth: Payer: Self-pay | Admitting: *Deleted

## 2018-04-06 NOTE — Telephone Encounter (Signed)
RECEIVED  RX REQUEST  FOR MYALN- LEVOTHYROX  INFORMED PHARMACIST - TO REACH OUT PATIENT PRIMARY DR Neva Seat FOR  RENEWAL REQUEST  VERBALIZED UNDERSTANDING

## 2018-04-11 ENCOUNTER — Telehealth: Payer: Self-pay | Admitting: Cardiology

## 2018-04-11 ENCOUNTER — Other Ambulatory Visit: Payer: Self-pay | Admitting: Cardiology

## 2018-04-11 DIAGNOSIS — I4891 Unspecified atrial fibrillation: Secondary | ICD-10-CM | POA: Diagnosis not present

## 2018-04-11 DIAGNOSIS — I482 Chronic atrial fibrillation, unspecified: Secondary | ICD-10-CM | POA: Diagnosis not present

## 2018-04-11 NOTE — Telephone Encounter (Signed)
Agree that walker is usually PCP Rx.  But not sure if she has one established. Bryan Lemma, MD

## 2018-04-11 NOTE — Telephone Encounter (Signed)
Called to give pt Dr.Harding's response. [email or web address].

## 2018-04-11 NOTE — Telephone Encounter (Signed)
Spoke with pt. Adv her that request for DME (I.e walker) should done through her pcp. Pt ask that I fwd the rqst to Dr.Harding anyway. Adv pt that I would and we will call her back with his response.

## 2018-04-11 NOTE — Telephone Encounter (Signed)
New message   Patient states that she needs a prescription for a walker. Please call to discuss.

## 2018-04-12 LAB — PROTIME-INR
INR: 3 — AB
PROTHROMBIN TIME: 28.7 s — AB (ref 9.0–11.5)

## 2018-04-12 NOTE — Telephone Encounter (Signed)
Spoke with pt. Adv her of Dr.Harding's response.  Marykay Lex, MD   4:11 PM  Note    Agree that walker is usually PCP Rx.  But not sure if she has one established. Bryan Lemma, MD     Pt verbalized understanding and voiced appreciation for the call back. she will contact her pcp's office.

## 2018-04-12 NOTE — Telephone Encounter (Signed)
F/U Message ° ° ° ° ° ° ° ° ° ° °Patient returned your call would like a call back °

## 2018-04-17 ENCOUNTER — Encounter: Payer: Self-pay | Admitting: Cardiology

## 2018-04-17 ENCOUNTER — Ambulatory Visit (INDEPENDENT_AMBULATORY_CARE_PROVIDER_SITE_OTHER): Payer: Medicare Other | Admitting: Pharmacist Clinician (PhC)/ Clinical Pharmacy Specialist

## 2018-04-17 ENCOUNTER — Ambulatory Visit: Payer: Medicare Other | Admitting: Cardiology

## 2018-04-17 VITALS — BP 118/68 | HR 99 | Ht 63.5 in | Wt 195.8 lb

## 2018-04-17 DIAGNOSIS — Z7901 Long term (current) use of anticoagulants: Secondary | ICD-10-CM

## 2018-04-17 DIAGNOSIS — R600 Localized edema: Secondary | ICD-10-CM | POA: Diagnosis not present

## 2018-04-17 DIAGNOSIS — I482 Chronic atrial fibrillation, unspecified: Secondary | ICD-10-CM | POA: Diagnosis not present

## 2018-04-17 DIAGNOSIS — I10 Essential (primary) hypertension: Secondary | ICD-10-CM | POA: Diagnosis not present

## 2018-04-17 NOTE — Patient Instructions (Signed)
Medication Instructions:  Not needed If you need a refill on your cardiac medications before your next appointment, please call your pharmacy.   Lab work: Not needed If you have labs (blood work) drawn today and your tests are completely normal, you will receive your results only by: Marland Kitchen MyChart Message (if you have MyChart) OR . A paper copy in the mail If you have any lab test that is abnormal or we need to change your treatment, we will call you to review the results.  Testing/Procedures: Not needed  Follow-Up: At [facility], you and your health needs are our priority.  As part of our continuing mission to provide you with exceptional heart care, we have created designated Provider Care Teams.  These Care Teams include your primary Cardiologist (physician) and Advanced Practice Providers (APPs -  Physician Assistants and Nurse Practitioners) who all work together to provide you with the care you need, when you need it. You will need a follow up appointment in 6 months.  Please call our office 2 months in advance to schedule this appointment.  You may see Bryan Lemma, MD or one of the following Advanced Practice Providers on your designated Care Team:   Theodore Demark, PA-C . Joni Reining, DNP, ANP  Any Other Special Instructions Will Be Listed Below (If Applicable).

## 2018-04-17 NOTE — Progress Notes (Signed)
PCP: [name], MD  Clinic Note: Chief Complaint  Patient presents with  . Follow-up    No complaints  . Atrial Fibrillation    HPI: [name] is a 77 y.o. female with a PMH of permanent Afib with mild DD who presents today for six-month follow-up.  [name] was last seen in May 2018. Still not aware of Afib.   Recent Hospitalizations: n/a  Studies Personally Reviewed - (if available, images/films reviewed: From Epic Chart or Care Everywhere)  N/a   Interval History: [name] returns today again still oblivious to being in or out of A. fib.  She really denies any symptoms besides this being a little more fatigued than usual.  Right now she thinks she pulled a muscle in her back helping her neighbor pushed the car in and out of the garage earlier this week. She just got back from [place] where she was visiting her her family.  She was staying with her son and his wife and daughter.  They were there for her mother's 100th birthday.  She spent a lot of time talking about this trip in about her family.  She did not do a lot of activity with them because her son was himself dealing with back pain.  She had several bouts of diarrhea due to food intolerances.  But despite that did not have any symptoms of syncope or near syncope.  No sensation of rapid irregular heartbeat.  She does not do much activity at all at home either.  Spends most of the day sitting playing cards.  Her dog just died and so she does need to have her dog to walk.  No chest pain or pressure with rest or exertion.  No PND orthopnea with just occasional edema.  She says that if she does not take her diuretic, her edema would get worse. No bleeding issues with warfarin.  ROS: A comprehensive was performed. Review of Systems  Constitutional: Positive for malaise/fatigue (Sedentary).  HENT: Negative for congestion and sore throat.   Respiratory: Negative for cough (Not so bad now), shortness of breath  (Baseline, exacerbated by allergies) and wheezing (Allergies).   Cardiovascular: Negative for leg swelling.  Gastrointestinal: Negative for heartburn, nausea and vomiting.  Genitourinary: Negative for hematuria.  Musculoskeletal: Positive for back pain and joint pain (Knees hips and ankles).  Neurological: Positive for dizziness (Positional if she does not eat or drink enough; vertigo, but recent episode) and weakness (Both legs).  Endo/Heme/Allergies: Positive for environmental allergies.  Psychiatric/Behavioral: Positive for depression (Still has lots of social stress.  Her roommate has worsening dementia and this is causing quite a lot of trouble.  She does not have any other friends locally.  Her closest friend is in [place].). Negative for memory loss. The patient has insomnia.   All other systems reviewed and are negative.  I have reviewed and (if needed) personally updated the patient's problem list, medications, allergies, past medical and surgical history, social and family history.   Past Medical History:  Diagnosis Date  . Atrial fibrillation, chronic (HCC): CHA2DS2Vasc = 4, on Warfarin; CCB for Rate control 05/01/2013   Previously on Flecainide - stopped due to recurrent Afib -- no longer effective. Did not tolerate Multaq.   This patients CHA2DS2-VASc Score and unadjusted Ischemic Stroke Rate (% per year) is equal to 4.8 % stroke rate/year from a score of 4 (HTN, Female, h/o TIA)    . Chronic fatigue   .  Chronic pain   . COPD (chronic obstructive pulmonary disease) (HCC)   . Dyspnea on exertion    A.) Lexican Mycardial Perfusion with Wall Motion LVEF, Date:09/07/2011, TID ratio 1.03, lung to heart ratio 26%.  Marland Kitchen Hypothyroidism   . Obesity   . Seasonal allergies   . Stroke (HCC)   . Thyroid disease    HYPOTHYROID  . TIA (transient ischemic attack) 2010   A.) Carotid Duplex study, 10/08/2008, minimal disease    Past Surgical History:  Procedure Laterality Date  .  CARDIOVERSION N/A 08/16/2012   Procedure: CARDIOVERSION;  Surgeon: Marykay Lex, MD;  Location: [facility] ENDOSCOPY;  Service: Endoscopy;  Laterality: N/A;  labs on arrival   . CARDIOVERSION N/A 09/14/2012   Procedure: CARDIOVERSION;  Surgeon: Chrystie Nose, MD;  Location: [facility] ENDOSCOPY;  Service: Cardiovascular;  Laterality: N/A;  . 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 NM MYOVIEW LTD  April 2013   No evidence of ischemia or infarction. LOW RISK;  normal EF  . TONSILLECTOMY    . TOTAL HIP ARTHROPLASTY  [id number]  . TRANSTHORACIC ECHOCARDIOGRAM  09-2012   Moderate concentric LVH. EF 60-65%.; Mild LA dilation.    Current Meds  Medication Sig  . albuterol (PROVENTIL HFA;VENTOLIN HFA) 108 (90 Base) MCG/ACT inhaler Inhale 2 puffs into the lungs every 4 (four) hours as needed for wheezing or shortness of breath (cough, shortness of breath or wheezing.).  Marland Kitchen diltiazem (CARDIZEM CD) 240 MG 24 hr capsule TAKE 1 CAPSULE BY MOUTH  DAILY  . furosemide (LASIX) 40 MG tablet Take 1 tablet (40 mg total) by mouth daily.  Marland Kitchen levothyroxine (SYNTHROID, LEVOTHROID) 100 MCG tablet TAKE 1 TABLET BY MOUTH  DAILY  . warfarin (COUMADIN) 2.5 MG tablet TAKE 1/2 TO 1 TABLET BY  MOUTH DAILY AS DIRECTED BY  COUMADIN CLINIC    Allergies  Allergen Reactions  . Amiodarone Diarrhea    Social History   Tobacco Use  . Smoking status: Former Smoker    Packs/day: 2.00    Last attempt to quit: 06/07/2004    Years since quitting: 13.8  . Smokeless tobacco: Never Used  Substance Use Topics  . Alcohol use: No    Comment: occasionally  . Drug use: No   Social History   Social History Narrative   She is divorced, and is accompanied by her long-time companion/friend who is always with her. They are both transplants from [place] (she is from [place]).   She is a former smoker, who quit in 2006. She claims to never have "inhaled."   She does not get routine exercise but she and her friend are very active.    family  history includes Alcoholism in her brother and father; Healthy in her sister.  Wt Readings from Last 3 Encounters:  04/17/18 195 lb 12.8 oz (88.8 kg)  10/05/17 195 lb 12.8 oz (88.8 kg)  04/19/17 202 lb (91.6 kg)    PHYSICAL EXAM BP 118/68   Pulse 99   Ht 5' 3.5" (1.613 m)   Wt 195 lb 12.8 oz (88.8 kg)   BMI 34.14 kg/m  Physical Exam  Constitutional: She is oriented to person, place, and time. She appears well-developed and well-nourished. No distress.  Well-groomed.  Relatively healthy appearing  HENT:  Head: Normocephalic and atraumatic.  Neck: Normal range of motion. Neck supple. No hepatojugular reflux and no JVD present. Carotid bruit is not present.  Cardiovascular: Normal rate and normal pulses. An irregularly irregular  rhythm present. PMI is not displaced. Exam reveals no gallop and no friction rub.  Murmur heard.  Medium-pitched blowing decrescendo holosystolic murmur is present at the lower left sternal border and apex. Pulmonary/Chest: Effort normal. No respiratory distress. She has wheezes (Mild baseline wheeze.  No rhonchi). She has no rales.  Musculoskeletal: Normal range of motion. She exhibits edema (Trivial).  Neurological: She is alert and oriented to person, place, and time.  Psychiatric: She has a normal mood and affect. Her behavior is normal. Judgment and thought content normal.  Vitals reviewed.    Adult ECG Report Not checked   Other studies Reviewed: Additional studies/ records that were reviewed today include:  Recent Labs: Labs are followed by PCP, but not available..   ASSESSMENT / PLAN:   [name] continues to be stable with rate controlled A. fib.  Her heart rate is little bit faster today than that usually has been because her back is hurting her a lot.  She really has no complaints with the amount of activity she does.   No heart failure symptoms, and her edema is well controlled as long as she takes her standing Lasix.  She continues to insist  on taking warfarin and not switching to DOAC.  Denies any bleeding issues, and feels like having closer monitoring makes her feel safer. Notes that her rate is usually well controlled with diltiazem, and the same dose is adequately controlling her blood pressure.   Problem List Items Addressed This Visit    Atrial fibrillation, chronic (HCC): CHA2DS2Vasc = 4, on Warfarin; CCB for Rate control - Primary (Chronic)   Bilateral lower extremity edema (Chronic)   Essential hypertension (Chronic)   Long term current use of anticoagulant therapy (Chronic)       I spent a total of 25 minutes with the patient and chart review. >  50% of the time was spent in direct patient consultation.  As is the usual case, she spends a lot of time talking about her social situation, and basically uses this is a venting session.  Most of it was all about is not necessarily cardiac in nature, but is therapeutic for her.  Current medicines are reviewed at length with the patient today.  (+/- concerns) n/a -not really DOAC The following changes have been made:  N/A  Patient Instructions  Medication Instructions:  Not needed If you need a refill on your cardiac medications before your next appointment, please call your pharmacy.   Lab work: Not needed If you have labs (blood work) drawn today and your tests are completely normal, you will receive your results only by: Marland Kitchen MyChart Message (if you have MyChart) OR . A paper copy in the mail If you have any lab test that is abnormal or we need to change your treatment, we will call you to review the results.  Testing/Procedures: Not needed  Follow-Up: At [facility], you and your health needs are our priority.  As part of our continuing mission to provide you with exceptional heart care, we have created designated Provider Care Teams.  These Care Teams include your primary Cardiologist (physician) and Advanced Practice Providers (APPs -  Physician Assistants and  Nurse Practitioners) who all work together to provide you with the care you need, when you need it. You will need a follow up appointment in 6 months.  Please call our office 2 months in advance to schedule this appointment.  You may see Bryan Lemma, MD or one of the following  Advanced Practice Providers on your designated Care Team:   Theodore Demark, PA-C . Joni Reining, DNP, ANP  Any Other Special Instructions Will Be Listed Below (If Applicable).       Studies Ordered:   No orders of the defined types were placed in this encounter.     Bryan Lemma, M.D., M.S. Interventional Cardiologist   Pager # [phone] Phone # [phone] [address] [place], [place] [address]   Thank you for choosing Heart[facility] at [facility]!!

## 2018-04-19 ENCOUNTER — Encounter: Payer: Self-pay | Admitting: Cardiology

## 2018-04-26 DIAGNOSIS — J449 Chronic obstructive pulmonary disease, unspecified: Secondary | ICD-10-CM | POA: Diagnosis not present

## 2018-04-26 DIAGNOSIS — R6889 Other general symptoms and signs: Secondary | ICD-10-CM | POA: Diagnosis not present

## 2018-04-26 DIAGNOSIS — I4891 Unspecified atrial fibrillation: Secondary | ICD-10-CM | POA: Diagnosis not present

## 2018-05-15 ENCOUNTER — Other Ambulatory Visit: Payer: Self-pay | Admitting: Family Medicine

## 2018-05-15 DIAGNOSIS — E039 Hypothyroidism, unspecified: Secondary | ICD-10-CM

## 2018-05-16 NOTE — Telephone Encounter (Signed)
Requested medication (s) are due for refill today: yes  Requested medication (s) are on the active medication list: no  Last refill:  03/27/18  Future visit scheduled: No  Notes to clinic:  Medication not on current list    Requested Prescriptions  Pending Prescriptions Disp Refills   levothyroxine (SYNTHROID, LEVOTHROID) 100 MCG tablet [Pharmacy Med Name: MYLAN-LEVOTHYROX 0.1MG  TABLET] 90 tablet 0    Sig: TAKE 1 TABLET BY MOUTH  DAILY     Endocrinology:  Hypothyroid Agents Failed - 05/15/2018  6:14 AM      Failed - TSH needs to be rechecked within 3 months after an abnormal result. Refill until TSH is due.      Failed - TSH in normal range and within 360 days    TSH  Date Value Ref Range Status  04/19/2017 0.994 0.450 - 4.500 uIU/mL Final         Failed - Valid encounter within last 12 months    Recent Outpatient Visits          1 year ago Hypothyroidism, unspecified type   [facility] [place] [place], [place]   1 year ago Cough   [facility], [place]ion Part[place] R, [place]   1 year ago Acute bronchitis, unspecified organism   [facility] [place]

## 2018-06-08 ENCOUNTER — Other Ambulatory Visit: Payer: Self-pay | Admitting: Cardiology

## 2018-06-08 DIAGNOSIS — I482 Chronic atrial fibrillation, unspecified: Secondary | ICD-10-CM | POA: Diagnosis not present

## 2018-06-08 DIAGNOSIS — I4891 Unspecified atrial fibrillation: Secondary | ICD-10-CM | POA: Diagnosis not present

## 2018-06-08 LAB — PROTIME-INR: INR: 3.2 — AB (ref 0.9–1.1)

## 2018-06-09 LAB — PROTIME-INR
INR: 3.2 — ABNORMAL HIGH
Prothrombin Time: 30.8 s — ABNORMAL HIGH (ref 9.0–11.5)

## 2018-06-21 ENCOUNTER — Telehealth: Payer: Self-pay

## 2018-06-21 NOTE — Telephone Encounter (Signed)
Pt stated that they had their [facility] [facility] but we have not received results

## 2018-06-28 ENCOUNTER — Telehealth: Payer: Self-pay | Admitting: Cardiology

## 2018-06-28 ENCOUNTER — Ambulatory Visit (INDEPENDENT_AMBULATORY_CARE_PROVIDER_SITE_OTHER): Payer: Medicare Other | Admitting: Pharmacist Clinician (PhC)/ Clinical Pharmacy Specialist

## 2018-06-28 DIAGNOSIS — I482 Chronic atrial fibrillation, unspecified: Secondary | ICD-10-CM

## 2018-06-28 DIAGNOSIS — Z7901 Long term (current) use of anticoagulants: Secondary | ICD-10-CM

## 2018-06-28 NOTE — Telephone Encounter (Signed)
Called lab to get copy of results, dated 06-08-2018.  See anticoag note.  Also LM for patient to call, would prefer she come to [facility] in office to decrease time between draw and results reporting.

## 2018-06-28 NOTE — Telephone Encounter (Signed)
Follow up:    Patient returning your called. Please call patient back.

## 2018-06-28 NOTE — Telephone Encounter (Signed)
Patient willing to try [facility] until she determines what the cost to her will be.

## 2018-06-30 ENCOUNTER — Other Ambulatory Visit: Payer: Self-pay | Admitting: Family Medicine

## 2018-06-30 DIAGNOSIS — E039 Hypothyroidism, unspecified: Secondary | ICD-10-CM

## 2018-06-30 NOTE — Telephone Encounter (Signed)
Call placed to patient.  Appointment for CPE scheduled 07/28/2018. Last CPE and check of TSH 04/19/17.

## 2018-07-07 ENCOUNTER — Other Ambulatory Visit: Payer: Self-pay | Admitting: Pharmacist Clinician (PhC)/ Clinical Pharmacy Specialist

## 2018-07-07 DIAGNOSIS — I482 Chronic atrial fibrillation, unspecified: Secondary | ICD-10-CM

## 2018-07-18 ENCOUNTER — Other Ambulatory Visit: Payer: Self-pay | Admitting: Pharmacist Clinician (PhC)/ Clinical Pharmacy Specialist

## 2018-07-18 DIAGNOSIS — I482 Chronic atrial fibrillation, unspecified: Secondary | ICD-10-CM

## 2018-07-19 DIAGNOSIS — I482 Chronic atrial fibrillation, unspecified: Secondary | ICD-10-CM | POA: Diagnosis not present

## 2018-07-20 LAB — PROTIME-INR
INR: 3 — ABNORMAL HIGH (ref 0.8–1.2)
Prothrombin Time: 28.2 s — ABNORMAL HIGH (ref 9.1–12.0)

## 2018-07-28 ENCOUNTER — Encounter: Payer: Self-pay | Admitting: Family Medicine

## 2018-08-16 ENCOUNTER — Telehealth: Payer: Self-pay

## 2018-08-16 NOTE — Telephone Encounter (Signed)
Called the pt regarding overdue inr. Pt stated, "I don't know what you've done with my 07/20/18 blood but I have done it." She told me that she has it done with [facility]. I told her that if she would like she could come and have a coumadin appt w/ Baxter Hire. Pt was very agitated and quite upset with me because no one had called to result the labwork from 07/20/18 and I told her that now we cannot dose the medication because that was a month ago. I told her that if she is already coming to have it drawn in the building that it would be easier to have an appt with Baxter Hire and that seemed to upset her even more. Will [facility] the pharmd because she wants to speak with her.

## 2018-08-17 ENCOUNTER — Ambulatory Visit (INDEPENDENT_AMBULATORY_CARE_PROVIDER_SITE_OTHER): Payer: Medicare Other | Admitting: Pharmacist Clinician (PhC)/ Clinical Pharmacy Specialist

## 2018-08-17 DIAGNOSIS — I482 Chronic atrial fibrillation, unspecified: Secondary | ICD-10-CM

## 2018-08-17 DIAGNOSIS — Z7901 Long term (current) use of anticoagulants: Secondary | ICD-10-CM

## 2018-08-17 NOTE — Telephone Encounter (Signed)
Spoke with patient - she see's PCP next week, will see if they can do draw at that time for INR.  Patient trying to cut her exposure to the public.

## 2018-08-22 ENCOUNTER — Telehealth: Payer: Self-pay | Admitting: Family Medicine

## 2018-08-22 ENCOUNTER — Other Ambulatory Visit: Payer: Self-pay

## 2018-08-22 DIAGNOSIS — E039 Hypothyroidism, unspecified: Secondary | ICD-10-CM

## 2018-08-22 DIAGNOSIS — R062 Wheezing: Secondary | ICD-10-CM

## 2018-08-22 MED ORDER — LEVOTHYROXINE SODIUM 100 MCG PO TABS: 100.0000 ug | ORAL_TABLET | Freq: Every day | ORAL | 0 refills | Status: DC

## 2018-08-22 MED ORDER — ALBUTEROL SULFATE HFA 108 (90 BASE) MCG/ACT IN AERS: 2.0000 | INHALATION_SPRAY | RESPIRATORY_TRACT | 0 refills | Status: DC | PRN

## 2018-08-22 NOTE — Telephone Encounter (Signed)
Copied from CRM [id number]. Topic: Quick Communication - Rx Refill/Question >> Aug 22, 2018 11:47 AM Jaquita Rector A wrote: Medication: levothyroxine (SYNTHROID, LEVOTHROID) 100 MCG tablet   Has the patient contacted their pharmacy? Yes.   (Agent: If no, request that the patient contact the pharmacy for the refill.) (Agent: If yes, when and what did the pharmacy advise?) [facility] [place] [place] - [address] [facility] Caffie Damme [phone] (Phone) [phone] (Fax)   Preferred Pharmacy (with phone number or street name):   Agent: Please be advised that RX refills may take up to 3 business days. We ask that you follow-up with your pharmacy.

## 2018-08-22 NOTE — Telephone Encounter (Signed)
Sent to pt pharmacy per Dr. Neva Seat

## 2018-08-22 NOTE — Telephone Encounter (Signed)
Copied from CRM [id number]. Topic: Quick Communication - Rx Refill/Question >> Aug 22, 2018 11:46 AM Jaquita Rector A wrote: Medication: albuterol (PROVENTIL HFA;VENTOLIN HFA) 108 (90 Base) MCG/ACT inhaler  Has the patient contacted their pharmacy? Yes.   (Agent: If no, request that the patient contact the pharmacy for the refill.) (Agent: If yes, when and what did the pharmacy advise?)  Preferred Pharmacy (with phone number or street name): [id number] [place] Otto, [place] - [address] AT [facility] [phone] (Phone) [phone] (Fax)    Agent: Please be advised that RX refills may take up to 3 business days. We ask that you follow-up with your pharmacy.

## 2018-08-22 NOTE — Telephone Encounter (Signed)
90 day supply sent in 

## 2018-08-24 ENCOUNTER — Encounter: Payer: Self-pay | Admitting: Family Medicine

## 2018-08-29 ENCOUNTER — Telehealth: Payer: Self-pay | Admitting: Family Medicine

## 2018-08-29 NOTE — Telephone Encounter (Signed)
Called patient about appt scheduled with dr. Neva Seat on 08/24/2018 that was a no show. If patient is concerned about the COVID-19 we can schedule a virtual visit if patient would like to. No updated dpr on file so did not leave a VM

## 2018-08-31 ENCOUNTER — Other Ambulatory Visit: Payer: Self-pay | Admitting: Cardiology

## 2018-09-05 ENCOUNTER — Telehealth: Payer: Self-pay

## 2018-09-05 NOTE — Telephone Encounter (Signed)

## 2018-09-07 ENCOUNTER — Ambulatory Visit (INDEPENDENT_AMBULATORY_CARE_PROVIDER_SITE_OTHER): Payer: Medicare Other | Admitting: Pharmacist

## 2018-09-07 ENCOUNTER — Other Ambulatory Visit: Payer: Self-pay | Admitting: Cardiology

## 2018-09-07 DIAGNOSIS — I482 Chronic atrial fibrillation, unspecified: Secondary | ICD-10-CM | POA: Diagnosis not present

## 2018-09-07 DIAGNOSIS — Z7901 Long term (current) use of anticoagulants: Secondary | ICD-10-CM | POA: Diagnosis not present

## 2018-09-07 LAB — POCT INR: INR: 3 (ref 2.0–3.0)

## 2018-09-08 ENCOUNTER — Other Ambulatory Visit: Payer: Self-pay

## 2018-09-25 ENCOUNTER — Telehealth: Payer: Self-pay | Admitting: Registered Nurse

## 2018-09-25 NOTE — Telephone Encounter (Signed)
I called Ms [name] this morning to discuss her need for an AWV. Unfortunately, the voicemail box has not been set up at this time. She can be booked for an AWV with me at any time should she return the call.

## 2018-10-04 ENCOUNTER — Telehealth: Payer: Self-pay

## 2018-10-04 NOTE — Telephone Encounter (Signed)
Virtual Visit Pre-Appointment Phone Call  "([name]), I am calling you today to discuss your upcoming appointment. We are currently trying to limit exposure to the virus that causes COVID-19 by seeing patients at home rather than in the office."  1. "What is the BEST phone number to call the day of the visit?" - [phone]  2. "Do you have or have access to (through a family member/friend) a smartphone with video capability that we can use for your visit?" a. If no - list the appointment type as a PHONE visit in appointment notes  3. Confirm consent - "In the setting of the current Covid19 crisis, you are scheduled for a (phone) visit with your provider on (May 11) at (3:00pm).  Just as we do with many in-office visits, in order for you to participate in this visit, we must obtain consent.  If you'd like, I can send this to your mychart (if signed up) or email for you to review.  Otherwise, I can obtain your verbal consent now.  All virtual visits are billed to your insurance company just like a normal visit would be.  By agreeing to a virtual visit, we'd like you to understand that the technology does not allow for your provider to perform an examination, and thus may limit your provider's ability to fully assess your condition. If your provider identifies any concerns that need to be evaluated in person, we will make arrangements to do so.  Finally, though the technology is pretty good, we cannot assure that it will always work on either your or our end, and in the setting of a video visit, we may have to convert it to a phone-only visit.  In either situation, we cannot ensure that we have a secure connection.  Are you willing to proceed?" STAFF: Did the patient verbally acknowledge consent to telehealth visit? Document YES/NO here:YES  4. Advise patient to be prepared - "Two hours prior to your appointment, go ahead and check your blood pressure, pulse, oxygen saturation, and your weight (if  you have the equipment to check those) and write them all down. When your visit starts, your provider will ask you for this information. If you have an Apple Watch or Kardia device, please plan to have heart rate information ready on the day of your appointment. Please have a pen and paper handy nearby the day of the visit as well."  5. Give patient instructions for MyChart download to smartphone OR Doximity/Doxy.me as below if video visit (depending on what platform provider is using)  6. Inform patient they will receive a phone call 15 minutes prior to their appointment time (may be from unknown caller ID) so they should be prepared to answer    TELEPHONE CALL NOTE  [name] has been deemed a candidate for a follow-up tele-health visit to limit community exposure during the Covid-19 pandemic. I spoke with the patient via phone to ensure availability of phone/video source, confirm preferred email & phone number, and discuss instructions and expectations.  I reminded [name] to be prepared with any vital sign and/or heart rhythm information that could potentially be obtained via home monitoring, at the time of her visit. I reminded [name] to expect a phone call prior to her visit.  [name], CMA 10/04/2018 10:57 AM   INSTRUCTIONS FOR DOWNLOADING THE MYCHART APP TO SMARTPHONE  - The patient must first make sure to have activated MyChart and know their  login information - If Apple, go to [employer] Store and type in MyChart in the search bar and download the app. If Android, ask patient to go to [employer] Play Store and type in [place]Chart in the search bar and download the app. The app is free but as with any other app downloads, their phone may require them to verify saved payment information or Apple/Android password.  - The patient will need to then log into the app with their MyChart username and password, and select [facility] as their healthcare provider to link the account. When it  is time for your visit, go to the MyChart app, find appointments, and click Begin Video Visit. Be sure to Select Allow for your device to access the Microphone and Camera for your visit. You will then be connected, and your provider will be with you shortly.  **If they have any issues connecting, or need assistance please contact MyChart service desk [phone])**  **If using a computer, in order to ensure the best quality for their visit they will need to use either of the following Internet Browsers: [employer] Edge, or Google Chrome**  IF USING DOXIMITY or DOXY.ME - The patient will receive a link just prior to their visit by text.     FULL LENGTH CONSENT FOR TELE-HEALTH VISIT   I hereby voluntarily request, consent and authorize [facility] and its employed or contracted physicians, physician assistants, nurse practitioners or other licensed health care professionals (the Practitioner), to provide me with telemedicine health care services (the "Services") as deemed necessary by the treating Practitioner. I acknowledge and consent to receive the Services by the Practitioner via telemedicine. I understand that the telemedicine visit will involve communicating with the Practitioner through live audiovisual communication technology and the disclosure of certain medical information by electronic transmission. I acknowledge that I have been given the opportunity to request an in-person assessment or other available alternative prior to the telemedicine visit and am voluntarily participating in the telemedicine visit.  I understand that I have the right to withhold or withdraw my consent to the use of telemedicine in the course of my care at any time, without affecting my right to future care or treatment, and that the Practitioner or I may terminate the telemedicine visit at any time. I understand that I have the right to inspect all information obtained and/or recorded in the course of  the telemedicine visit and may receive copies of available information for a reasonable fee.  I understand that some of the potential risks of receiving the Services via telemedicine include:  Marland Kitchen. Delay or interruption in medical evaluation due to technological equipment failure or disruption; . Information transmitted may not be sufficient (e.g. poor resolution of images) to allow for appropriate medical decision making by the Practitioner; and/or  . In rare instances, security protocols could fail, causing a breach of personal health information.  Furthermore, I acknowledge that it is my responsibility to provide information about my medical history, conditions and care that is complete and accurate to the best of my ability. I acknowledge that Practitioner's advice, recommendations, and/or decision may be based on factors not within their control, such as incomplete or inaccurate data provided by me or distortions of diagnostic images or specimens that may result from electronic transmissions. I understand that the practice of medicine is not an exact science and that Practitioner makes no warranties or guarantees regarding treatment outcomes. I acknowledge that I will receive a copy of this consent concurrently  upon execution via email to the email address I last provided but may also request a printed copy by calling the office of [facility].    I understand that my insurance will be billed for this visit.   I have read or had this consent read to me. . I understand the contents of this consent, which adequately explains the benefits and risks of the Services being provided via telemedicine.  . I have been provided ample opportunity to ask questions regarding this consent and the Services and have had my questions answered to my satisfaction. . I give my informed consent for the services to be provided through the use of telemedicine in my medical care  By participating in this telemedicine  visit I agree to the above.

## 2018-10-13 IMAGING — DX DG CHEST 2V
2 series · 2 of 2 positions shown · non-contrast
Comparison: 07/19/2011 and prior radiograph

CLINICAL DATA: Cough and wheezing.  History of COPD.

EXAM:
CHEST  2 VIEW

[chest pa]
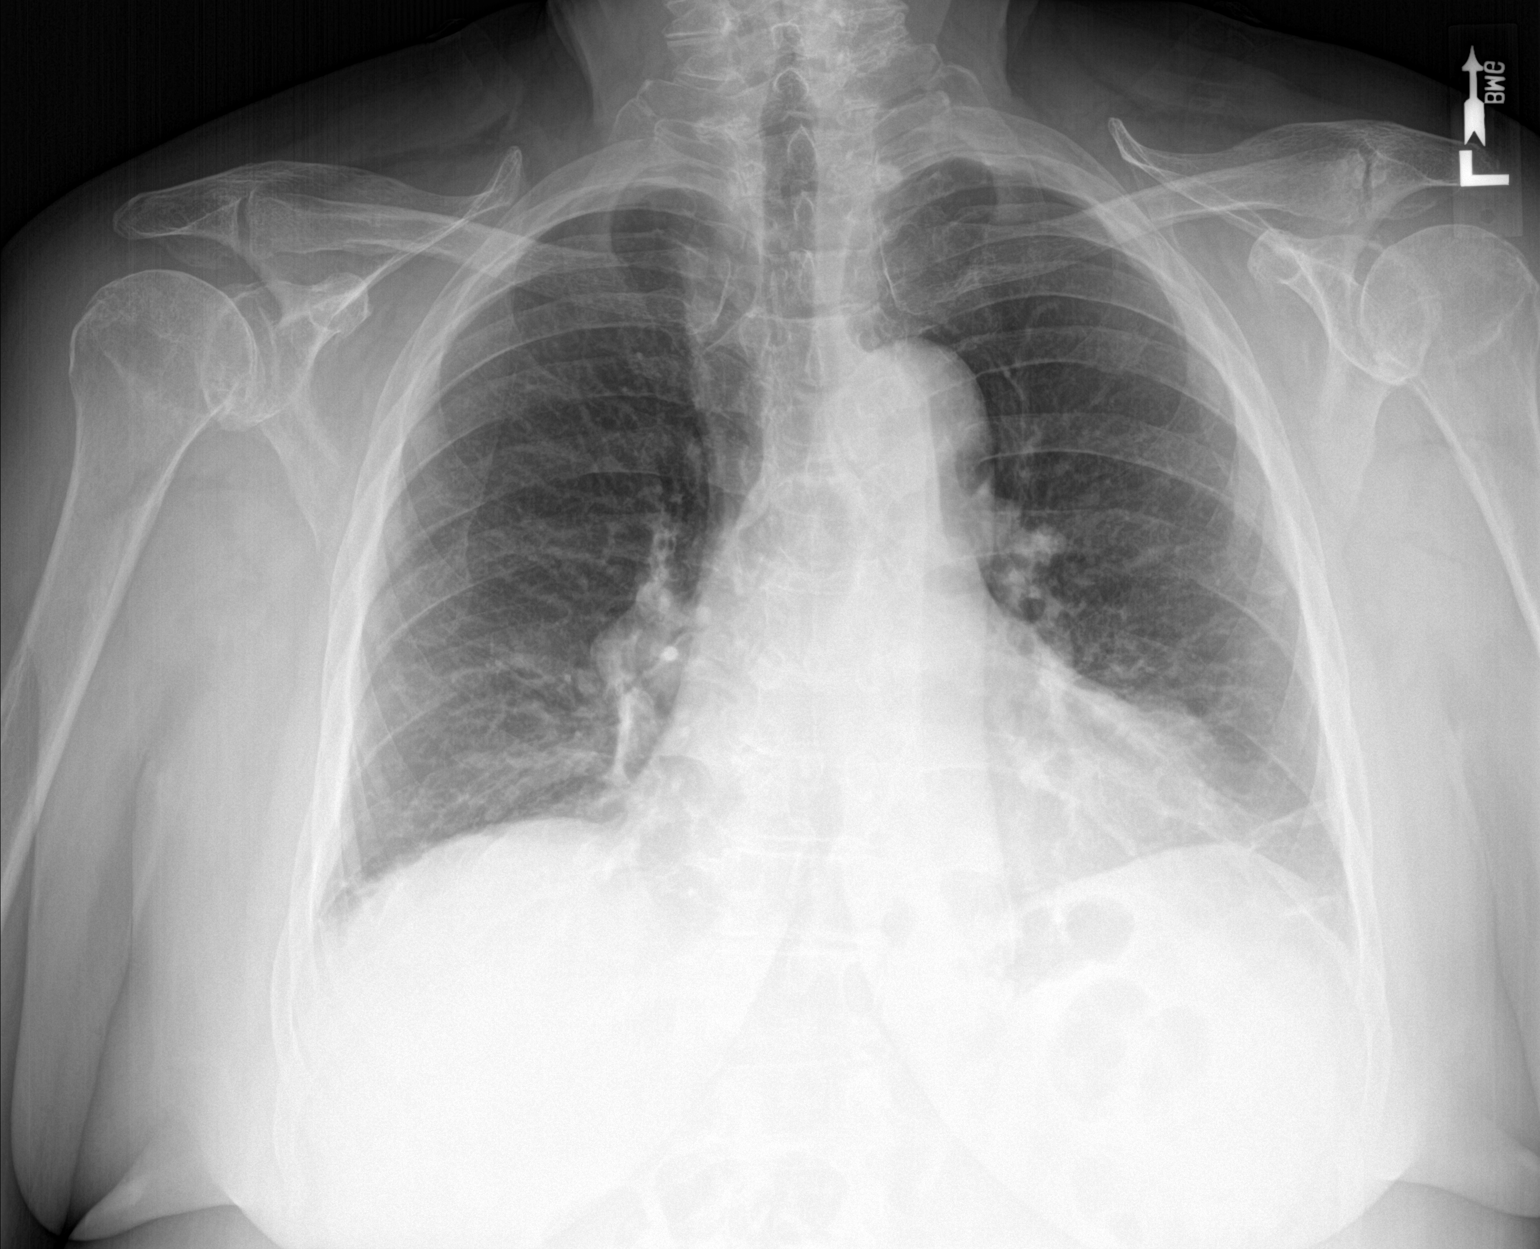

[chest lat]
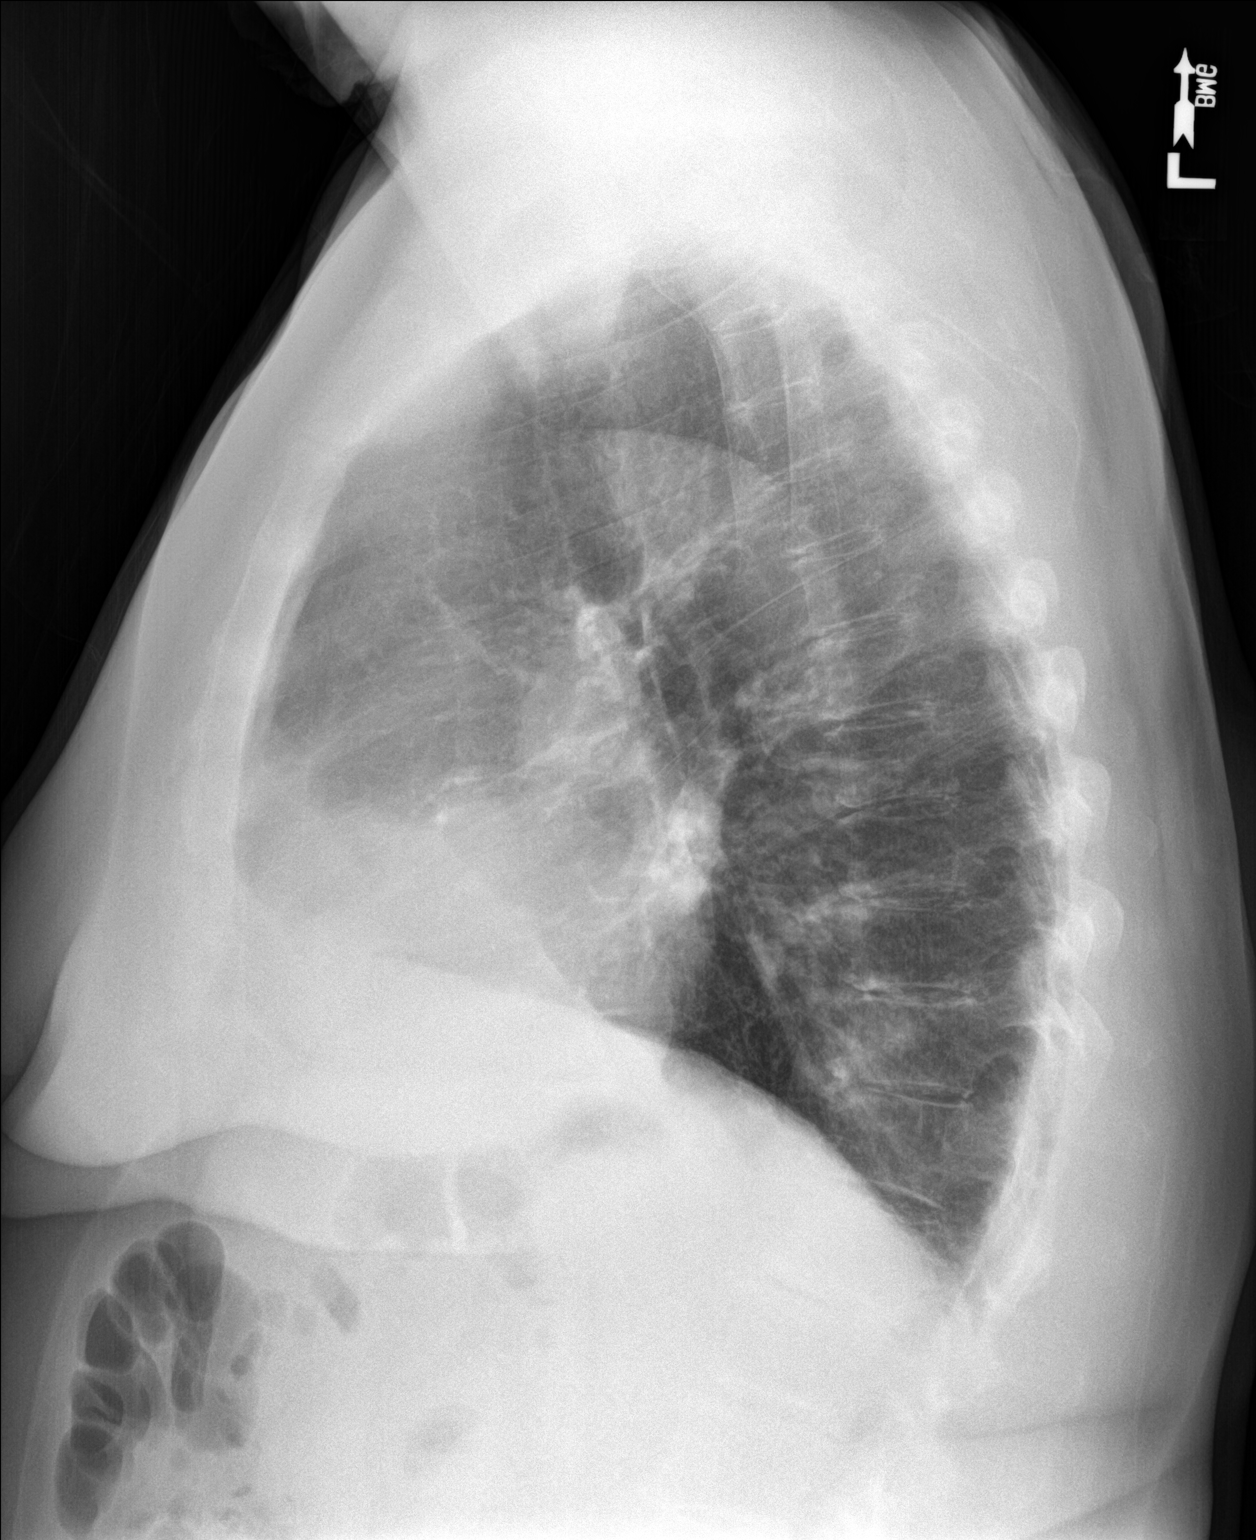

[2 of 2 positions shown; findings below may reference images not displayed]

FINDINGS: Cardiomegaly, mild peribronchial thickening and mild bibasilar
scarring again noted.

There is no evidence of focal airspace disease, pulmonary edema,
suspicious pulmonary nodule/mass, pleural effusion, or pneumothorax.
No acute bony abnormalities are identified.
IMPRESSION: No evidence of acute cardiopulmonary disease.

Cardiomegaly and mild chronic peribronchial thickening.

## 2018-10-16 ENCOUNTER — Telehealth (INDEPENDENT_AMBULATORY_CARE_PROVIDER_SITE_OTHER): Payer: Medicare Other | Admitting: Cardiology

## 2018-10-16 ENCOUNTER — Encounter: Payer: Self-pay | Admitting: Cardiology

## 2018-10-16 ENCOUNTER — Telehealth: Payer: Self-pay | Admitting: *Deleted

## 2018-10-16 VITALS — Ht 63.0 in | Wt 197.0 lb

## 2018-10-16 DIAGNOSIS — I482 Chronic atrial fibrillation, unspecified: Secondary | ICD-10-CM

## 2018-10-16 DIAGNOSIS — R600 Localized edema: Secondary | ICD-10-CM

## 2018-10-16 DIAGNOSIS — I10 Essential (primary) hypertension: Secondary | ICD-10-CM

## 2018-10-16 NOTE — Telephone Encounter (Signed)
Spoke to patient. Instruction given -- for 5/11 tele-visit. Avs summary will be mailed to patient . Verbalized understanding

## 2018-10-16 NOTE — Progress Notes (Signed)
Virtual Visit via Telephone Note   This visit type was conducted due to national recommendations for restrictions regarding the COVID-19 Pandemic (e.g. social distancing) in an effort to limit this patient's exposure and mitigate transmission in our community.  Due to her co-morbid illnesses, this patient is at least at moderate risk for complications without adequate follow up.  This format is felt to be most appropriate for this patient at this time.  The patient did not have access to video technology/had technical difficulties with video requiring transitioning to audio format only (telephone).  All issues noted in this document were discussed and addressed.  No physical exam could be performed with this format.  Please refer to the patient's chart for her  consent to telehealth for [facility].   The patient does not have video technology.  Patient has given verbal permission to conduct this visit via virtual appointment and to bill insurance 10/04/2018 11:02 AM     Evaluation Performed:  Follow-up visit  Date:  10/16/2018   ID:  [name], DOB [birth year], MRN [id number]  Patient Location: Home Provider Location: Other:  Hospital Office  PCP:  [name], MD  Cardiologist:  Bryan Lemma, MD  Electrophysiologist:  None   Chief Complaint: 71-month follow-up, permanent A. fib and mild HFpEF  History of Present Illness:    [name] is a 78 y.o. female with PMH notable for permanent A. fib who presents via audio/video conferencing for a telehealth visit today.  MIRINDA WELCHER was last seen in November 2019.  Still not able to know if she is in A. fib or not.  Maybe is to little more fatigued than usual.  Pulled a muscle in her back and therefore was not able to do any activity. She does not do much activity at all at home either.  Spends most of the day sitting playing cards.  Her dog just died and so she does need to have her dog to walk.  Limited by knees, hips and ankle  pain.  Also had vertigo symptoms.  No chest pain or pressure with rest or exertion.  No PND orthopnea with just occasional edema.  She says that if she does not take her diuretic, her edema would get worse. No bleeding issues with warfarin.  Does not want to switch to DOAC.  Interval History:  Only notes HR up if angry.  One day last week - felt that her heart was doing something funny.  Only time that she thought of taking PRN Diltiazem.  Eating too much & not very active. Back still hurts. Much less balance. Bought a walker - bought for going to craft show @ [employer].  -- a little more edema, ankles -- probably eating more pretzels & potato chips.   Taking Mucinex for allergies - dry cough & mucous build-up.  Loose stools @ night - maybe something she is eating that does not agree with her.  Occasionally wakes up gasping.   Very depressed  Cardiovascular ROS: positive for - dyspnea on exertion, edema and irregular heartbeat negative for - chest pain, orthopnea, paroxysmal nocturnal dyspnea, shortness of breath or syncope/near syncope; TIA/amaruosis fugax; melena, hematochezia, hematuria, epistaxis; claudication  The patient does not have symptoms concerning for COVID-19 infection (fever, chills, cough, or new shortness of breath). Cough from allergies. The patient is practicing social distancing. Goes to [employer] every other week & [employer] on other week.   ROS:  Please see the history  of present illness.    Review of Systems  Constitutional: Positive for malaise/fatigue (no real change).  HENT: Positive for congestion (allergies - taking Mucinex) and nosebleeds.   Respiratory: Positive for shortness of breath (with fast exertion).   Gastrointestinal: Positive for diarrhea (loose stools). Negative for constipation, heartburn, nausea and vomiting.  Genitourinary: Positive for frequency. Negative for dysuria and hematuria.  Musculoskeletal: Positive for joint pain. Negative for falls.   Neurological: Positive for dizziness (vertigo). Negative for weakness.  Psychiatric/Behavioral: Positive for depression and memory loss. The patient is not nervous/anxious.   All other systems reviewed and are negative.   Past Medical History:  Diagnosis Date  . Atrial fibrillation, chronic (HCC): CHA2DS2Vasc = 4, on Warfarin; CCB for Rate control 05/01/2013   Previously on Flecainide - stopped due to recurrent Afib -- no longer effective. Did not tolerate Multaq.   This patients CHA2DS2-VASc Score and unadjusted Ischemic Stroke Rate (% per year) is equal to 4.8 % stroke rate/year from a score of 4 (HTN, Female, h/o TIA)    . Chronic fatigue   . Chronic pain   . COPD (chronic obstructive pulmonary disease) (HCC)   . Dyspnea on exertion    A.) [place] Mycardial Perfusion with Wall Motion LVEF, Date:09/07/2011, TID ratio 1.03, lung to heart ratio 26%.  Marland Kitchen. Hypothyroidism   . Obesity   . Seasonal allergies   . Stroke (HCC)   . Thyroid disease    HYPOTHYROID  . TIA (transient ischemic attack) 2010   A.) Carotid Duplex study, 10/08/2008, minimal disease    Past Surgical History:  Procedure Laterality Date  . CARDIOVERSION N/A 08/16/2012   Procedure: CARDIOVERSION;  Surgeon: Marykay Lexavid W Krista Som, MD;  [facility];  Service: Endoscopy;  Laterality: N/A;  labs on arrival   . CARDIOVERSION N/A 09/14/2012   Procedure: CARDIOVERSION;  Surgeon: Chrystie NoseKenneth C. Hilty, MD;  [facility];  Service: Cardiovascular;  Laterality: N/A;  . 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 NM MYOVIEW LTD  April 2013   No evidence of ischemia or infarction. LOW RISK;  normal EF  . TONSILLECTOMY    . TOTAL HIP ARTHROPLASTY  [id number],2012  . TRANSTHORACIC ECHOCARDIOGRAM  09-2012   Moderate concentric LVH. EF 60-65%.; Mild LA dilation.     Current Meds  Medication Sig  . albuterol (PROVENTIL HFA;VENTOLIN HFA) 108 (90 Base) MCG/ACT inhaler Inhale 2 puffs into the lungs every 4 (four) hours as needed  for wheezing or shortness of breath (cough, shortness of breath or wheezing.).  Marland Kitchen. diltiazem (CARDIZEM CD) 240 MG 24 hr capsule TAKE 1 CAPSULE BY MOUTH  DAILY  . furosemide (LASIX) 40 MG tablet Take 1 tablet (40 mg total) by mouth daily.  Marland Kitchen. levothyroxine (SYNTHROID, LEVOTHROID) 100 MCG tablet Take 1 tablet (100 mcg total) by mouth daily.  Marland Kitchen. warfarin (COUMADIN) 2.5 MG tablet TAKE 1/2 TO 1 TABLET BY  MOUTH DAILY AS DIRECTED BY  COUMADIN CLINIC   - does have PRN 60 mg Diltiazem (short acting)   Allergies:   Amiodarone   Social History   Tobacco Use  . Smoking status: Former Smoker    Packs/day: 2.00    Last attempt to quit: 06/07/2004    Years since quitting: 14.3  . Smokeless tobacco: Never Used  Substance Use Topics  . Alcohol use: No    Comment: occasionally  . Drug use: No     Family Hx: The patient's family history includes Alcoholism in her brother and  father; Healthy in her sister.   Prior CV studies:   The following studies were reviewed today: . none  Labs/Other Tests and Data Reviewed:    EKG:  No ECG reviewed.  Recent Labs: No results found for requested labs within last 8760 hours.   Recent Lipid Panel Checked by PCP  Wt Readings from Last 3 Encounters:  10/16/18 197 lb (89.4 kg)  04/17/18 195 lb 12.8 oz (88.8 kg)  10/05/17 195 lb 12.8 oz (88.8 kg)     Objective:    Vital Signs:  Ht  (1.6 m)   Wt 197 lb (89.4 kg)   BMI 34.90 kg/m  -Does not have a functioning blood pressure cuff. VITAL SIGNS:  reviewed GEN:  no acute distress RESPIRATORY: Nonlabored NEURO:  A&O x3 PSYCH: Relatively normal mood and affect.  Although she does note being depressed.  She is her usual jovial self   ASSESSMENT & PLAN:    Problem List Items Addressed This Visit    Atrial fibrillation, chronic (HCC): CHA2DS2Vasc = 4, on Warfarin; CCB for Rate control - Primary (Chronic)   Bilateral lower extremity edema (Chronic)   Essential hypertension (Chronic)     [place] is  still pretty stable.  Her A. fib is relatively controlled.  I did encourage her to take additional diltiazem if she does feel her heart rate going fast, but this is been relatively infrequent.  No bleeding issues on warfarin.  I think some of her edema is probably related to dietary indiscretion.  She is on a split her Lasix into 1/2 tablet and take it twice daily to help out with some of the evening edema.  Not having any PND orthopnea to make it concerning for heart failure.  Probably just dietary discretion.  Blood pressure has been pretty stable.  She was not able to check it at this time, but is on stable medications.  No adjustment now.  COVID-19 Education: The signs and symptoms of COVID-19 were discussed with the patient and how to seek care for testing (follow up with PCP or arrange E-visit).   The importance of social distancing was discussed today.  Time:   Today, I have spent 20 minutes with the patient with telehealth technology discussing the above problems.     Medication Adjustments/Labs and Tests Ordered: Current medicines are reviewed at length with the patient today.  Concerns regarding medicines are outlined above.  Medication Instructions:   OK to take take Furosemide 1/2 tab BID  Tests Ordered: No orders of the defined types were placed in this encounter. - none   Medication Changes: No orders of the defined types were placed in this encounter. --none  Disposition:  Follow up in 6 month(s)    Signed, Bryan Lemma, MD  10/16/2018 3:02 PM    [facility]

## 2018-10-16 NOTE — Patient Instructions (Addendum)
Medication Instructions:  OK to take take Furosemide 1/2 tab BID  Ok to use DILTIAZEM AS NEEDED FOR FAST RAPID HEART BEAT /PALPATIONS If you need a refill on your cardiac medications before your next appointment, please call your pharmacy.   Lab work: NOT NEEDED   Testing/Procedures: NOT NEEDED  Follow-Up: At [facility], you and your health needs are our priority.  As part of our continuing mission to provide you with exceptional heart care, we have created designated Provider Care Teams.  These Care Teams include your primary Cardiologist (physician) and Advanced Practice Providers (APPs -  Physician Assistants and Nurse Practitioners) who all work together to provide you with the care you need, when you need it. . You will need a follow up appointment in 6 months- NOV 2020.  Please call our office 2 months in advance to schedule this appointment.  You may see Bryan Lemma, MD or one of the following Advanced Practice Providers on your designated Care Team:   . Theodore Demark, PA-C . Joni Reining, DNP, ANP  Any Other Special Instructions Will Be Listed Below (If Applicable).

## 2018-10-18 ENCOUNTER — Telehealth: Payer: Self-pay

## 2018-10-18 NOTE — Telephone Encounter (Signed)

## 2018-10-19 ENCOUNTER — Other Ambulatory Visit: Payer: Self-pay

## 2018-10-19 ENCOUNTER — Ambulatory Visit (INDEPENDENT_AMBULATORY_CARE_PROVIDER_SITE_OTHER): Payer: Medicare Other | Admitting: Pharmacist

## 2018-10-19 DIAGNOSIS — I482 Chronic atrial fibrillation, unspecified: Secondary | ICD-10-CM | POA: Diagnosis not present

## 2018-10-19 DIAGNOSIS — Z7901 Long term (current) use of anticoagulants: Secondary | ICD-10-CM | POA: Diagnosis not present

## 2018-10-19 LAB — POCT INR: INR: 3.4 — AB (ref 2.0–3.0)

## 2018-11-02 ENCOUNTER — Other Ambulatory Visit: Payer: Self-pay | Admitting: Family Medicine

## 2018-11-02 ENCOUNTER — Other Ambulatory Visit: Payer: Self-pay | Admitting: Cardiology

## 2018-11-02 DIAGNOSIS — E039 Hypothyroidism, unspecified: Secondary | ICD-10-CM

## 2018-11-20 ENCOUNTER — Telehealth: Payer: Self-pay

## 2018-11-20 NOTE — Telephone Encounter (Signed)

## 2018-11-24 ENCOUNTER — Ambulatory Visit (INDEPENDENT_AMBULATORY_CARE_PROVIDER_SITE_OTHER): Payer: Medicare Other | Admitting: *Deleted

## 2018-11-24 ENCOUNTER — Other Ambulatory Visit: Payer: Self-pay

## 2018-11-24 DIAGNOSIS — Z7901 Long term (current) use of anticoagulants: Secondary | ICD-10-CM | POA: Diagnosis not present

## 2018-11-24 DIAGNOSIS — I482 Chronic atrial fibrillation, unspecified: Secondary | ICD-10-CM | POA: Diagnosis not present

## 2018-11-24 LAB — POCT INR: INR: 4.7 — AB (ref 2.0–3.0)

## 2018-11-24 NOTE — Patient Instructions (Signed)
Description   Skip today and tomorrow's dose, then Continue with 2.5 mg daily except 1.25 mg each Wednesday.  Repeat INR in 2 weeks. Eat 2 servings each week of leafy green vegetable.

## 2018-11-30 ENCOUNTER — Telehealth: Payer: Self-pay

## 2018-11-30 NOTE — Telephone Encounter (Signed)

## 2018-12-07 ENCOUNTER — Ambulatory Visit (INDEPENDENT_AMBULATORY_CARE_PROVIDER_SITE_OTHER): Payer: Medicare Other

## 2018-12-07 ENCOUNTER — Other Ambulatory Visit: Payer: Self-pay

## 2018-12-07 DIAGNOSIS — Z7901 Long term (current) use of anticoagulants: Secondary | ICD-10-CM

## 2018-12-07 DIAGNOSIS — I482 Chronic atrial fibrillation, unspecified: Secondary | ICD-10-CM

## 2018-12-07 LAB — POCT INR: INR: 2.8 (ref 2.0–3.0)

## 2018-12-07 NOTE — Patient Instructions (Signed)
Description   Continue with 2.5 mg daily except 1.25 mg each Wednesday.  Repeat INR in 4 weeks. Eat 2 servings each week of leafy green vegetable.

## 2019-01-04 ENCOUNTER — Ambulatory Visit (INDEPENDENT_AMBULATORY_CARE_PROVIDER_SITE_OTHER): Payer: Medicare Other | Admitting: Pharmacist Clinician (PhC)/ Clinical Pharmacy Specialist

## 2019-01-04 ENCOUNTER — Other Ambulatory Visit: Payer: Self-pay

## 2019-01-04 ENCOUNTER — Ambulatory Visit: Payer: Self-pay | Admitting: *Deleted

## 2019-01-04 DIAGNOSIS — I482 Chronic atrial fibrillation, unspecified: Secondary | ICD-10-CM

## 2019-01-04 DIAGNOSIS — Z7901 Long term (current) use of anticoagulants: Secondary | ICD-10-CM

## 2019-01-04 LAB — POCT INR: INR: 4.4 — AB (ref 2.0–3.0)

## 2019-01-04 NOTE — Telephone Encounter (Signed)
Pt called with having a bruise area on her left arm from last Friday. She got it from lifting boxes. She had her blood work done today because she is on coumadin. The nurse looked at it in the office and touched the middle of the bruised which left a pink area there. She is concerned now that that area is still there. She denies pain or fever. Advised her to go to an urgent care to let a provider look at it. She voiced understanding. Routing to Primary Care at [facility] for review.

## 2019-02-01 DIAGNOSIS — I482 Chronic atrial fibrillation, unspecified: Secondary | ICD-10-CM | POA: Diagnosis not present

## 2019-02-02 LAB — PROTIME-INR
INR: 3.3 — ABNORMAL HIGH (ref 0.8–1.2)
Prothrombin Time: 33.4 s — ABNORMAL HIGH (ref 9.1–12.0)

## 2019-02-05 ENCOUNTER — Ambulatory Visit (INDEPENDENT_AMBULATORY_CARE_PROVIDER_SITE_OTHER): Payer: Medicare Other | Admitting: Cardiovascular Disease

## 2019-02-05 DIAGNOSIS — I482 Chronic atrial fibrillation, unspecified: Secondary | ICD-10-CM

## 2019-02-05 DIAGNOSIS — Z7901 Long term (current) use of anticoagulants: Secondary | ICD-10-CM | POA: Diagnosis not present

## 2019-02-16 ENCOUNTER — Other Ambulatory Visit: Payer: Self-pay | Admitting: Family Medicine

## 2019-02-16 ENCOUNTER — Other Ambulatory Visit: Payer: Self-pay | Admitting: Cardiology

## 2019-02-16 DIAGNOSIS — E039 Hypothyroidism, unspecified: Secondary | ICD-10-CM

## 2019-02-16 NOTE — Telephone Encounter (Signed)
Forwarding medication refill request to the clinical pool for review. 

## 2019-02-26 DIAGNOSIS — Z23 Encounter for immunization: Secondary | ICD-10-CM | POA: Diagnosis not present

## 2019-03-02 ENCOUNTER — Other Ambulatory Visit: Payer: Self-pay

## 2019-03-02 ENCOUNTER — Ambulatory Visit (INDEPENDENT_AMBULATORY_CARE_PROVIDER_SITE_OTHER): Payer: Medicare Other | Admitting: Pharmacist

## 2019-03-02 DIAGNOSIS — I482 Chronic atrial fibrillation, unspecified: Secondary | ICD-10-CM

## 2019-03-02 DIAGNOSIS — Z7901 Long term (current) use of anticoagulants: Secondary | ICD-10-CM

## 2019-03-02 LAB — POCT INR: INR: 4.5 — AB (ref 2.0–3.0)

## 2019-03-23 ENCOUNTER — Ambulatory Visit (INDEPENDENT_AMBULATORY_CARE_PROVIDER_SITE_OTHER): Payer: Medicare Other | Admitting: Pharmacist Clinician (PhC)/ Clinical Pharmacy Specialist

## 2019-03-23 ENCOUNTER — Other Ambulatory Visit: Payer: Self-pay

## 2019-03-23 DIAGNOSIS — I482 Chronic atrial fibrillation, unspecified: Secondary | ICD-10-CM | POA: Diagnosis not present

## 2019-03-23 DIAGNOSIS — Z7901 Long term (current) use of anticoagulants: Secondary | ICD-10-CM

## 2019-03-23 LAB — POCT INR: INR: 2.9 (ref 2.0–3.0)

## 2019-04-27 ENCOUNTER — Ambulatory Visit (INDEPENDENT_AMBULATORY_CARE_PROVIDER_SITE_OTHER): Payer: Medicare Other | Admitting: Pharmacist Clinician (PhC)/ Clinical Pharmacy Specialist

## 2019-04-27 ENCOUNTER — Other Ambulatory Visit: Payer: Self-pay

## 2019-04-27 DIAGNOSIS — I482 Chronic atrial fibrillation, unspecified: Secondary | ICD-10-CM | POA: Diagnosis not present

## 2019-04-27 DIAGNOSIS — Z7901 Long term (current) use of anticoagulants: Secondary | ICD-10-CM | POA: Diagnosis not present

## 2019-04-27 LAB — POCT INR: INR: 3.5 — AB (ref 2.0–3.0)

## 2019-05-18 ENCOUNTER — Encounter (INDEPENDENT_AMBULATORY_CARE_PROVIDER_SITE_OTHER): Payer: Self-pay

## 2019-05-18 ENCOUNTER — Other Ambulatory Visit: Payer: Self-pay

## 2019-05-18 ENCOUNTER — Ambulatory Visit (INDEPENDENT_AMBULATORY_CARE_PROVIDER_SITE_OTHER): Payer: Medicare Other | Admitting: Pharmacist Clinician (PhC)/ Clinical Pharmacy Specialist

## 2019-05-18 ENCOUNTER — Ambulatory Visit: Payer: Medicare Other | Admitting: Cardiology

## 2019-05-18 ENCOUNTER — Encounter: Payer: Self-pay | Admitting: Cardiology

## 2019-05-18 VITALS — BP 118/70 | HR 102 | Ht 63.0 in | Wt 193.0 lb

## 2019-05-18 DIAGNOSIS — R600 Localized edema: Secondary | ICD-10-CM

## 2019-05-18 DIAGNOSIS — E669 Obesity, unspecified: Secondary | ICD-10-CM

## 2019-05-18 DIAGNOSIS — I10 Essential (primary) hypertension: Secondary | ICD-10-CM

## 2019-05-18 DIAGNOSIS — I482 Chronic atrial fibrillation, unspecified: Secondary | ICD-10-CM | POA: Diagnosis not present

## 2019-05-18 DIAGNOSIS — Z7901 Long term (current) use of anticoagulants: Secondary | ICD-10-CM

## 2019-05-18 DIAGNOSIS — J302 Other seasonal allergic rhinitis: Secondary | ICD-10-CM

## 2019-05-18 LAB — POCT INR: INR: 3.8 — AB (ref 2.0–3.0)

## 2019-05-18 MED ORDER — FUROSEMIDE 40 MG PO TABS: 40.0000 mg | ORAL_TABLET | Freq: Every day | ORAL | 1 refills | Status: DC

## 2019-05-18 MED ORDER — DILTIAZEM HCL ER COATED BEADS 240 MG PO CP24: 240.0000 mg | ORAL_CAPSULE | Freq: Every day | ORAL | 1 refills | Status: DC

## 2019-05-18 NOTE — Patient Instructions (Addendum)
Medication Instructions:  No changes *If you need a refill on your cardiac medications before your next appointment, please call your pharmacy*  Lab Work: None ordered If you have labs (blood work) drawn today and your tests are completely normal, you will receive your results only by: Marland Kitchen MyChart Message (if you have MyChart) OR . A paper copy in the mail If you have any lab test that is abnormal or we need to change your treatment, we will call you to review the results.  Testing/Procedures: None ordered  Follow-Up: At [facility], you and your health needs are our priority.  As part of our continuing mission to provide you with exceptional heart care, we have created designated Provider Care Teams.  These Care Teams include your primary Cardiologist (physician) and Advanced Practice Providers (APPs -  Physician Assistants and Nurse Practitioners) who all work together to provide you with the care you need, when you need it.  Your next appointment:   6 month(s)  The format for your next appointment:   Virtual Visit   Provider:   Glenetta Hew, MD

## 2019-05-18 NOTE — Progress Notes (Signed)
Primary Care Provider: Wendie Agreste, MD Cardiologist: Glenetta Hew, MD Electrophysiologist: n/a  Clinic Note: Chief Complaint  Patient presents with  . Follow-up    Mostly psychological issues  . Atrial Fibrillation    Rates go up when she is upset    HPI:    [name] is a 78 y.o. female with a PMH notable for permanent A. fib but mild HFpEF who presents today for 80-monthfollow-up.  Erika NASERwas last seen on May 20 for telemedicine follow-up.  Only noted that her heart rate goes up when she is angry.  [facility], but was not able to go because of Covid.  Was having allergy issues.  Very depressed.  Recent Hospitalizations: None  Reviewed  CV studies:    The following studies were reviewed today: (if available, images/films reviewed: From Epic Chart or Care Everywhere) . None:   Interval History:   Erika HANGERreturns here today for 610-monthisit really with no cardiac complaints.  She is very upset today because she is now receiving very concerning text messages from a young man who she met while buying tickets for lottery at a gas station.  She paid for one of his tickets, and he asked for her phone number in order to let her know if if he won the lottery, so they could share it.  Unfortunately he is now using her phone number to send very inappropriate and harassing, vulgar text messages.  As a result she has been under a lot of stress, she is having a hard time sleeping at night, concerned that he is in a break in the house.  She has noted as a result that her heart rate is been up quite a bit and she also says her blood pressures been going up as well.  She says she is much tired all the time, but really does not do much of anything.  She never has used a walker she bought because of she not able go out with a craft show secondary to Covid restrictions.  She states around playing cards most of the day with her roommate,  but she does exert herself pretty significantly carrying several pounds of Food, cat litter etc. into the house.  With this, she chest tightness or pressure, but she every now and then there was on her back.  She mostly notes that whenever is doing much activity, she is limited by her legs just giving out on her.  CV Review of Symptoms (Summary): positive for - chest pain, rapid heart rate and Fatigue and weakness, along with dizziness, but no syncope or near syncope. negative for - chest pain, edema, irregular heartbeat, orthopnea, paroxysmal nocturnal dyspnea, shortness of breath or TIA/amaurosis fugax.  Legs are weak but no claudication   The patient does not have symptoms concerning for COVID-19 infection (fever, chills, cough, or new shortness of breath).  The patient is practicing social distancing. ++ Masking.  She has to go out for groceries/shopping, but is extremely careful.   REVIEWED OF SYSTEMS   A comprehensive ROS was performed. Review of Systems  Constitutional: Positive for malaise/fatigue. Negative for weight loss.  HENT: Negative for congestion.   Respiratory: Positive for cough (Chronic), shortness of breath (Per HPI) and wheezing (With allergies, but they are doing okay right now.).   Gastrointestinal: Negative for blood in stool, heartburn and melena.  Genitourinary: Negative for hematuria.  Musculoskeletal: Positive for back pain, falls and joint pain (Knees and hips).  Neurological: Positive for dizziness and weakness (both legs). Negative for headaches.  Psychiatric/Behavioral: Positive for depression (Very depressed-anhedonia, poor sleep, poor eating habits, anxiety,). Negative for memory loss. The patient is nervous/anxious and has insomnia.        Her baseline anxiety and depression is being exacerbated by this unfortunate incident with the young man having her phone number.   I have reviewed and (if needed) personally updated the patient's problem list,  medications, allergies, past medical and surgical history, social and family history.   PAST MEDICAL HISTORY   Past Medical History:  Diagnosis Date  . Atrial fibrillation, chronic ([place]): CHA2DS2Vasc = 4, on Warfarin; CCB for Rate control 05/01/2013   Previously on Flecainide - stopped due to recurrent Afib -- no longer effective. Did not tolerate Multaq.   This patients CHA2DS2-VASc Score and unadjusted Ischemic Stroke Rate (% per year) is equal to 4.8 % stroke rate/year from a score of 4 (HTN, Female, h/o TIA)    . Chronic fatigue   . Chronic pain   . COPD (chronic obstructive pulmonary disease) ([place])   . Dyspnea on exertion    A.) Lexican Mycardial Perfusion with Wall Motion LVEF, Date:09/07/2011, TID ratio 1.03, lung to heart ratio 26%.  [name] Hypothyroidism   . Obesity   . Seasonal allergies   . Stroke ([place])   . Thyroid disease    HYPOTHYROID  . TIA (transient ischemic attack) 2010   A.) Carotid Duplex study, 10/08/2008, minimal disease     PAST SURGICAL HISTORY   Past Surgical History:  Procedure Laterality Date  . CARDIOVERSION N/A 08/16/2012   Procedure: CARDIOVERSION;  Surgeon: Leonie Man, MD;  Location: [place];  Service: Endoscopy;  Laterality: N/A;  labs on arrival   . CARDIOVERSION N/A 09/14/2012   Procedure: CARDIOVERSION;  Surgeon: Pixie Casino, MD;  [facility]OS[facility];  Service: Cardiovascular;  Laterality: N/A;  . 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 NM MYOVIEW LTD  April 2013   No evidence of ischemia or infarction. LOW RISK;  normal EF  . TONSILLECTOMY    . TOTAL HIP ARTHROPLASTY  [id number]  . TRANSTHORACIC ECHOCARDIOGRAM  09-2012   Moderate concentric LVH. EF 60-65%.; Mild LA dilation.     MEDICATIONS/ALLERGIES   Current Meds  Medication Sig  . albuterol (PROVENTIL HFA;VENTOLIN HFA) 108 (90 Base) MCG/ACT inhaler Inhale 2 puffs into the lungs every 4 (four) hours as needed for wheezing or shortness of breath (cough, shortness of  breath or wheezing.).  [name] diltiazem (CARDIZEM CD) 240 MG 24 hr capsule Take 1 capsule (240 mg total) by mouth daily.  . furosemide (LASIX) 40 MG tablet Take 1 tablet (40 mg total) by mouth daily.  [name] levothyroxine (SYNTHROID) 100 MCG tablet TAKE 1 TABLET BY MOUTH  DAILY  . warfarin (COUMADIN) 2.5 MG tablet TAKE 1/2 TO 1 TABLET BY  MOUTH DAILY AS DIRECTED BY  COUMADIN CLINIC  . [DISCONTINUED] diltiazem (CARDIZEM CD) 240 MG 24 hr capsule TAKE 1 CAPSULE BY MOUTH  DAILY  . [DISCONTINUED] furosemide (LASIX) 40 MG tablet TAKE 1 TABLET BY MOUTH  DAILY    Allergies  Allergen Reactions  . Amiodarone Diarrhea     SOCIAL HISTORY/FAMILY HISTORY   Social History   Tobacco Use  . Smoking status: Former Smoker    Packs/day: 2.00    Quit date: 06/07/2004    Years since quitting: 14.9  .  Smokeless tobacco: Never Used  Substance Use Topics  . Alcohol use: No    Comment: occasionally  . Drug use: No   Social History   Social History Narrative   She is divorced, and is accompanied by her long-time companion/friend who is always with her. They are both transplants from [place] (she is from [place]).   She is a former smoker, who quit in 2006. She claims to never have "inhaled."   She does not get routine exercise but she and her friend are very active.    Family History family history includes Alcoholism in her brother and father; Healthy in her sister.   OBJCTIVE -PE, EKG, labs   Wt Readings from Last 3 Encounters:  05/18/19 193 lb (87.5 kg)  10/16/18 197 lb (89.4 kg)  04/17/18 195 lb 12.8 oz (88.8 kg)    Physical Exam: BP 118/70   Pulse (!) 102   Ht 5' 3"  (1.6 m)   Wt 193 lb (87.5 kg)   SpO2 98%   BMI 34.19 kg/m  Physical Exam  Constitutional: She is oriented to person, place, and time. She appears well-developed and well-nourished. She appears distressed (Emotionally, but not physically).  HENT:  Head: Normocephalic and atraumatic.  Neck: No JVD present.  Cardiovascular:  Normal rate, S1 normal and S2 normal. An irregularly irregular rhythm present. PMI is not displaced. Exam reveals distant heart sounds and decreased pulses (Minimally decreased pedal pulses). Exam reveals no gallop and no friction rub.  Murmur heard.  Medium-pitched blowing decrescendo holosystolic murmur is present with a grade of 1/6 at the lower left sternal border and apex. Pulmonary/Chest: Effort normal. No respiratory distress. She has wheezes (Mild baseline). She has no rales.  Abdominal: Soft. Bowel sounds are normal. She exhibits no distension. There is no abdominal tenderness. There is no rebound.  Musculoskeletal:        General: No edema (Trivial). Normal range of motion.     Cervical back: Normal range of motion and neck supple.  Neurological: She is alert and oriented to person, place, and time.  Skin: Skin is warm and dry.  Psychiatric: Her behavior is normal. Judgment and thought content normal.  Very depressed, anxious mood  Vitals reviewed.   Adult ECG Report  Rate: 102 ;  Rhythm: atrial fibrillation and With rapid rate.  Nonspecific ST-T wave changes.  Low voltage.;   Narrative Interpretation: Rate high otherwise normal.  Recent Labs: Labs followed by PCP.  Not available.  ASSESSMENT/PLAN    Problem List Items Addressed This Visit    Obesity (BMI 35.0-39.9 without comorbidity) -  (Chronic)    Continue to encourage dietary modification and make sure she tries to do some type of exercise.  Very much limited by osteoarthritis pains and poor balance.      Atrial fibrillation, chronic ([place]): CHA2DS2Vasc = 4, on Warfarin; CCB for Rate control (Chronic)    For the most part, she is rate controlled on current dose of diltiazem.  A little fast today mostly because she is so upset.  She did not do well with a higher dose, for now we will continue current dose. On warfarin--we discussed her therapeutic threshold.      Relevant Medications   diltiazem (CARDIZEM CD) 240 MG 24  hr capsule   furosemide (LASIX) 40 MG tablet   Other Relevant Orders   EKG 12-Lead   Bilateral lower extremity edema (Chronic)    Stable.  Minimal swelling.  On stable dose of diuretic.  Not requiring any additional doses.      Essential hypertension - Primary (Chronic)    Well-controlled on diltiazem plus furosemide.  She really does not have hypertension.  This can be taken off of her problem list.      Relevant Medications   diltiazem (CARDIZEM CD) 240 MG 24 hr capsule   furosemide (LASIX) 40 MG tablet   Other Relevant Orders   EKG 12-Lead       COVID-19 Education: The signs and symptoms of COVID-19 were discussed with the patient and how to seek care for testing (follow up with PCP or arrange E-visit).   The importance of social distancing was discussed today.  I spent a total of 22 minutes with the patient and chart review. >  50% of the time was spent in direct patient consultation.  --> The majority time spent with the patient today was actually more related to her anxiety and stress with the issue of someone having her phone number.  She was very concerned and wanted to talk.  She also had some other basic health questions that 1 to be resolved.  She had questions about COVID-19 as well.  Additional time spent with chart review (studies, outside notes, etc): 6 Total Time: 28 min   Current medicines are reviewed at length with the patient today.  (+/- concerns) n/a   Patient Instructions / Medication Changes & Studies & Tests Ordered   Patient Instructions  Medication Instructions:  No changes *If you need a refill on your cardiac medications before your next appointment, please call your pharmacy*  Lab Work: None ordered If you have labs (blood work) drawn today and your tests are completely normal, you will receive your results only by: [name] MyChart Message (if you have MyChart) OR . A paper copy in the mail If you have any lab test that is abnormal or we need to  change your treatment, we will call you to review the results.  Testing/Procedures: None ordered  Follow-Up: At [facility], you and your health needs are our priority.  As part of our continuing mission to provide you with exceptional heart care, we have created designated Provider Care Teams.  These Care Teams include your primary Cardiologist (physician) and Advanced Practice Providers (APPs -  Physician Assistants and Nurse Practitioners) who all work together to provide you with the care you need, when you need it.  Your next appointment:   6 month(s)  The format for your next appointment:   Virtual Visit   Provider:   Glenetta Hew, MD    Studies Ordered:   Orders Placed This Encounter  Procedures  . EKG 12-Lead     Glenetta Hew, M.D., M.S. Interventional Cardiologist   Pager # [phone] Phone # [phone] [address] [place], [place] [address]   Thank you for choosing [facility] at [facility]!!

## 2019-05-18 NOTE — Patient Instructions (Signed)
No warfarin today, then decrease dose to 2.5 mg daily except 1.25 mg each Monday, Wendesday  and Friday.  Repeat INR in 3 weeks.

## 2019-05-20 ENCOUNTER — Encounter: Payer: Self-pay | Admitting: Cardiology

## 2019-05-20 NOTE — Assessment & Plan Note (Signed)
Stable.  Minimal swelling.  On stable dose of diuretic.  Not requiring any additional doses.

## 2019-05-20 NOTE — Assessment & Plan Note (Signed)
Well-controlled on diltiazem plus furosemide.  She really does not have hypertension.  This can be taken off of her problem list.

## 2019-05-20 NOTE — Assessment & Plan Note (Signed)
Continue to encourage dietary modification and make sure she tries to do some type of exercise.  Very much limited by osteoarthritis pains and poor balance.

## 2019-05-20 NOTE — Assessment & Plan Note (Addendum)
For the most part, she is rate controlled on current dose of diltiazem.  A little fast today mostly because she is so upset.  She did not do well with a higher dose, for now we will continue current dose. On warfarin--we discussed her therapeutic threshold.

## 2019-05-22 ENCOUNTER — Other Ambulatory Visit: Payer: Self-pay | Admitting: Pharmacist Clinician (PhC)/ Clinical Pharmacy Specialist

## 2019-05-22 MED ORDER — WARFARIN SODIUM 2.5 MG PO TABS: ORAL_TABLET | ORAL | 1 refills | Status: DC

## 2019-05-24 ENCOUNTER — Other Ambulatory Visit: Payer: Self-pay

## 2019-05-24 ENCOUNTER — Telehealth: Payer: Self-pay | Admitting: Family Medicine

## 2019-05-24 ENCOUNTER — Telehealth (INDEPENDENT_AMBULATORY_CARE_PROVIDER_SITE_OTHER): Payer: Medicare Other | Admitting: Family Medicine

## 2019-05-24 DIAGNOSIS — E039 Hypothyroidism, unspecified: Secondary | ICD-10-CM | POA: Diagnosis not present

## 2019-05-24 MED ORDER — LEVOTHYROXINE SODIUM 100 MCG PO TABS: 100.0000 ug | ORAL_TABLET | Freq: Every day | ORAL | 0 refills | Status: DC

## 2019-05-24 NOTE — Progress Notes (Signed)
CC- Medication refill- patient need a refill on levothyroxine. Patient not having any other issues to discuss at this time

## 2019-05-24 NOTE — Telephone Encounter (Signed)
Patient is calling back she did not hear from anyone for her scheduled telemed appt at 4:40p. [phone]

## 2019-05-24 NOTE — Progress Notes (Signed)
Virtual Visit via Telephone Note  I connected with Erika CompJean E Figeroa on 05/24/19 at 5:49 PM by telephone and verified that I am speaking with the correct person using two identifiers. Refused video visit.    I discussed the limitations, risks, security and privacy concerns of performing an evaluation and management service by telephone and the availability of in person appointments. I also discussed with the patient that there may be a patient responsible charge related to this service. The patient expressed understanding and agreed to proceed, consent obtained  Chief complaint:  hypothyroidism  History of Present Illness: [name] is a 78 y.o. female  Hypothyroidism:  Lab Results  Component Value Date   TSH 0.994 04/19/2017  Taking medication daily. 100mcg qd.  No new hot or cold intolerance.  Has noticed more hair falling out past year.  No recent skin changes, some increased fatigue past few months, but less active.  No new weight changes.   Seen by cardiology 12/11 for Afib.  Anxious with stressors.  Declines meds for depression or anxiety or meeting with therapist - feels like short term stressors.  On cardizem for rate control, warfarin for anticoagulation, furosemide for edema.     Patient Active Problem List   Diagnosis Date Noted  . Long term (current) use of anticoagulants 11/24/2017  . Atrial fibrillation, chronic ([facility]): CHA2DS2Vasc = 4, on Warfarin; CCB for Rate control 05/01/2013  . Bilateral lower extremity edema 05/01/2013  . Obesity (BMI 35.0-39.9 without comorbidity) -  02/26/2013  . Seasonal allergies 02/26/2013  . Long term current use of anticoagulant therapy 08/28/2012  . TIA (transient ischemic attack) 02/24/2012  . Hypothyroidism 02/05/2010  . Essential hypertension 02/05/2010   Past Medical History:  Diagnosis Date  . Atrial fibrillation, chronic ([facility]): CHA2DS2Vasc = 4, on Warfarin; CCB for Rate control 05/01/2013   Previously on Flecainide -  stopped due to recurrent Afib -- no longer effective. Did not tolerate Multaq.   This patients CHA2DS2-VASc Score and unadjusted Ischemic Stroke Rate (% per year) is equal to 4.8 % stroke rate/year from a score of 4 (HTN, Female, h/o TIA)    . Chronic fatigue   . Chronic pain   . COPD (chronic obstructive pulmonary disease) ([facility])   . Dyspnea on exertion    A.) Lexican Mycardial Perfusion with Wall Motion LVEF, Date:09/07/2011, TID ratio 1.03, lung to heart ratio 26%.  Marland Kitchen. Hypothyroidism   . Obesity   . Seasonal allergies   . Stroke ([facility])   . Thyroid disease    HYPOTHYROID  . TIA (transient ischemic attack) 2010   A.) Carotid Duplex study, 10/08/2008, minimal disease   Past Surgical History:  Procedure Laterality Date  . CARDIOVERSION N/A 08/16/2012   Procedure: CARDIOVERSION;  Surgeon: Marykay Lexavid W Harding, MD;  [facility];  Service: Endoscopy;  Laterality: N/A;  labs on arrival   . CARDIOVERSION N/A 09/14/2012   Procedure: CARDIOVERSION;  Surgeon: Chrystie NoseKenneth C. Hilty, MD;  [facility];  Service: Cardiovascular;  Laterality: N/A;  . 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 NM MYOVIEW LTD  April 2013   No evidence of ischemia or infarction. LOW RISK;  normal EF  . TONSILLECTOMY    . TOTAL HIP ARTHROPLASTY  [id number],2012  . TRANSTHORACIC ECHOCARDIOGRAM  09-2012   Moderate concentric LVH. EF 60-65%.; Mild LA dilation.   Allergies  Allergen Reactions  . Amiodarone Diarrhea   Prior to Admission medications   Medication Sig Start Date  End Date Taking? Authorizing Provider  albuterol (PROVENTIL HFA;VENTOLIN HFA) 108 (90 Base) MCG/ACT inhaler Inhale 2 puffs into the lungs every 4 (four) hours as needed for wheezing or shortness of breath (cough, shortness of breath or wheezing.). 08/22/18   [name], MD  diltiazem (CARDIZEM CD) 240 MG 24 hr capsule Take 1 capsule (240 mg total) by mouth daily. 05/18/19   Marykay Lex, MD  furosemide (LASIX) 40 MG tablet Take 1  tablet (40 mg total) by mouth daily. 05/18/19   Marykay Lex, MD  levothyroxine (SYNTHROID) 100 MCG tablet TAKE 1 TABLET BY MOUTH  DAILY 02/16/19   [name], MD  warfarin (COUMADIN) 2.5 MG tablet Take 1 tablet by mouth daily or as directed by coumadin clinic 05/22/19   Marykay Lex, MD   Social History   Socioeconomic History  . Marital status: Divorced    Spouse name: Not on file  . Number of children: Not on file  . Years of education: Not on file  . Highest education level: Not on file  Occupational History  . Not on file  Tobacco Use  . Smoking status: Former Smoker    Packs/day: 2.00    Quit date: 06/07/2004    Years since quitting: 14.9  . Smokeless tobacco: Never Used  Substance and Sexual Activity  . Alcohol use: No    Comment: occasionally  . Drug use: No  . Sexual activity: Not Currently  Other Topics Concern  . Not on file  Social History Narrative   She is divorced, and is accompanied by her long-time companion/friend who is always with her. They are both transplants from [place] (she is from [place]).   She is a former smoker, who quit in 2006. She claims to never have "inhaled."   She does not get routine exercise but she and her friend are very active.   Social Determinants of Health   Financial Resource Strain:   . Difficulty of Paying Living Expenses: Not on file  Food Insecurity:   . Worried About Programme researcher, broadcasting/film/video in the Last Year: Not on file  . Ran Out of Food in the Last Year: Not on file  Transportation Needs:   . Lack of Transportation (Medical): Not on file  . Lack of Transportation (Non-Medical): Not on file  Physical Activity:   . Days of Exercise per Week: Not on file  . Minutes of Exercise per Session: Not on file  Stress:   . Feeling of Stress : Not on file  Social Connections:   . Frequency of Communication with Friends and Family: Not on file  . Frequency of Social Gatherings with Friends and Family: Not on file  .  Attends Religious Services: Not on file  . Active Member of Clubs or Organizations: Not on file  . Attends Banker Meetings: Not on file  . Marital Status: Not on file  Intimate Partner Violence:   . Fear of Current or Ex-Partner: Not on file  . Emotionally Abused: Not on file  . Physically Abused: Not on file  . Sexually Abused: Not on file     Observations/Objective: No distress on phone.  Appropriate responses.  There were no vitals filed for this visit.  Vital signs reviewed from the cardiology visit December 11 with blood pressure 118/70, heart rate 102.  O2 sat 98% at that time and weight 193.  Wt Readings from Last 3 Encounters:  05/18/19 193 lb (87.5 kg)  10/16/18 197 lb (89.4 kg)  04/17/18 195 lb 12.8 oz (88.8 kg)     Assessment and Plan: Hypothyroidism, unspecified type - Plan: levothyroxine (SYNTHROID) 100 MCG tablet  -Stressed importance of ongoing monitoring of hypothyroidism with TSH which she has not had recently, especially with reported fatigue and hair changes past year.  Also discussed potential risks of under or overtreated hypothyroidism with history of atrial fibrillation.  Understanding expressed.  Agrees to come in for lab only visit.  TSH ordered.  Temporary refill of medication provided.  As long as TSH is stable, will plan on 87-month in person visit, noted exam with cardiology 6 days ago.  Follow Up Instructions:    I discussed the assessment and treatment plan with the patient. The patient was provided an opportunity to ask questions and all were answered. The patient agreed with the plan and demonstrated an understanding of the instructions.   The patient was advised to call back or seek an in-person evaluation if the symptoms worsen or if the condition fails to improve as anticipated.  I provided 14 minutes of non-face-to-face time during this encounter.  Signed,   Merri Ray, MD [facility] at [place].  05/24/19

## 2019-05-25 ENCOUNTER — Telehealth: Payer: Self-pay | Admitting: Family Medicine

## 2019-05-25 NOTE — Telephone Encounter (Signed)
-----   Message from Wendie Agreste, MD sent at 05/24/2019  6:05 PM EST ----- Lab only visit for tsh in next 2 weeks, in office appt with Carlota Raspberry in 6 months. Hypothyroidism.

## 2019-05-25 NOTE — Telephone Encounter (Signed)
Spoke with pt, she scheduled her lab visit, but did not schedule her 6 month f/u because she's "not worried about it and it's too far out"

## 2019-06-04 ENCOUNTER — Other Ambulatory Visit: Payer: Self-pay

## 2019-06-04 ENCOUNTER — Ambulatory Visit (INDEPENDENT_AMBULATORY_CARE_PROVIDER_SITE_OTHER): Payer: Medicare Other | Admitting: Family Medicine

## 2019-06-04 DIAGNOSIS — E039 Hypothyroidism, unspecified: Secondary | ICD-10-CM

## 2019-06-05 LAB — TSH: TSH: 0.569 u[IU]/mL (ref 0.450–4.500)

## 2019-06-05 NOTE — Progress Notes (Signed)
Lab letter sent 

## 2019-06-12 ENCOUNTER — Ambulatory Visit (INDEPENDENT_AMBULATORY_CARE_PROVIDER_SITE_OTHER): Payer: Medicare Other | Admitting: Family Medicine

## 2019-06-12 VITALS — BP 118/70 | Ht 63.0 in | Wt 193.0 lb

## 2019-06-12 DIAGNOSIS — Z0000 Encounter for general adult medical examination without abnormal findings: Secondary | ICD-10-CM | POA: Diagnosis not present

## 2019-06-12 NOTE — Patient Instructions (Signed)
Thank you for taking time to come for your Medicare Wellness Visit. I appreciate your ongoing commitment to your health goals. Please review the following plan we discussed and let me know if I can assist you in the future.  Julie Greer LPN  Preventive Care 79 Years and Older, Female Preventive care refers to lifestyle choices and visits with your health care provider that can promote health and wellness. This includes:  A yearly physical exam. This is also called an annual well check.  Regular dental and eye exams.  Immunizations.  Screening for certain conditions.  Healthy lifestyle choices, such as diet and exercise. What can I expect for my preventive care visit? Physical exam Your health care provider will check:  Height and weight. These may be used to calculate body mass index (BMI), which is a measurement that tells if you are at a healthy weight.  Heart rate and blood pressure.  Your skin for abnormal spots. Counseling Your health care provider may ask you questions about:  Alcohol, tobacco, and drug use.  Emotional well-being.  Home and relationship well-being.  Sexual activity.  Eating habits.  History of falls.  Memory and ability to understand (cognition).  Work and work environment.  Pregnancy and menstrual history. What immunizations do I need?  Influenza (flu) vaccine  This is recommended every year. Tetanus, diphtheria, and pertussis (Tdap) vaccine  You may need a Td booster every 10 years. Varicella (chickenpox) vaccine  You may need this vaccine if you have not already been vaccinated. Zoster (shingles) vaccine  You may need this after age 60. Pneumococcal conjugate (PCV13) vaccine  One dose is recommended after age 79. Pneumococcal polysaccharide (PPSV23) vaccine  One dose is recommended after age 79. Measles, mumps, and rubella (MMR) vaccine  You may need at least one dose of MMR if you were born in 1957 or later. You may also  need a second dose. Meningococcal conjugate (MenACWY) vaccine  You may need this if you have certain conditions. Hepatitis A vaccine  You may need this if you have certain conditions or if you travel or work in places where you may be exposed to hepatitis A. Hepatitis B vaccine  You may need this if you have certain conditions or if you travel or work in places where you may be exposed to hepatitis B. Haemophilus influenzae type b (Hib) vaccine  You may need this if you have certain conditions. You may receive vaccines as individual doses or as more than one vaccine together in one shot (combination vaccines). Talk with your health care provider about the risks and benefits of combination vaccines. What tests do I need? Blood tests  Lipid and cholesterol levels. These may be checked every 5 years, or more frequently depending on your overall health.  Hepatitis C test.  Hepatitis B test. Screening  Lung cancer screening. You may have this screening every year starting at age 79 if you have a 30-pack-year history of smoking and currently smoke or have quit within the past 15 years.  Colorectal cancer screening. All adults should have this screening starting at age 79 and continuing until age 79. Your health care provider may recommend screening at age 79 if you are at increased risk. You will have tests every 1-10 years, depending on your results and the type of screening test.  Diabetes screening. This is done by checking your blood sugar (glucose) after you have not eaten for a while (fasting). You may have this done every 1-3   Mammogram. This may be done every 1-2 years. Talk with your health care provider about how often you should have regular mammograms.  BRCA-related cancer screening. This may be done if you have a family history of breast, ovarian, tubal, or peritoneal cancers. Other tests  Sexually transmitted disease (STD) testing.  Bone density scan. This is done  to screen for osteoporosis. You may have this done starting at age 23. Follow these instructions at home: Eating and drinking  Eat a diet that includes fresh fruits and vegetables, whole grains, lean protein, and low-fat dairy products. Limit your intake of foods with high amounts of sugar, saturated fats, and salt.  Take vitamin and mineral supplements as recommended by your health care provider.  Do not drink alcohol if your health care provider tells you not to drink.  If you drink alcohol: ? Limit how much you have to 0-1 drink a day. ? Be aware of how much alcohol is in your drink. In the [place] one drink equals one 12 oz bottle of beer (355 mL), one 5 oz glass of wine (148 mL), or one 1 oz glass of hard liquor (44 mL). Lifestyle  Take daily care of your teeth and gums.  Stay active. Exercise for at least 30 minutes on 5 or more days each week.  Do not use any products that contain nicotine or tobacco, such as cigarettes, e-cigarettes, and chewing tobacco. If you need help quitting, ask your health care provider.  If you are sexually active, practice safe sex. Use a condom or other form of protection in order to prevent STIs (sexually transmitted infections).  Talk with your health care provider about taking a low-dose aspirin or statin. What's next?  Go to your health care provider once a year for a well check visit.  Ask your health care provider how often you should have your eyes and teeth checked.  Stay up to date on all vaccines. This information is not intended to replace advice given to you by your health care provider. Make sure you discuss any questions you have with your health care provider. Document Revised: 05/18/2018 Document Reviewed: 05/18/2018 Elsevier Patient Education  2020 [employer].

## 2019-06-12 NOTE — Progress Notes (Signed)
Presents today for [employer] Visit   Date of last exam: 06/04/2019  Interpreter used for this visit? No  I connected with  [name] on 06/12/19 by a telephone and verified that I am speaking with the correct person using two identifiers.   I discussed the limitations of evaluation and management by telemedicine. The patient expressed understanding and agreed to proceed.    Patient Care Team: Wendie Agreste, MD as PCP - General (Family Medicine) Leonie Man, MD as PCP - Cardiology (Cardiology)   Other items to address today  Discussed immunizations Discussed eye/dental     ADVANCE DIRECTIVES: Discussed: yes On File:  no Materials Provided: no  mailed  Immunization status:  Immunization History  Administered Date(s) Administered  . Influenza Whole 03/03/2011  . Influenza-Unspecified 03/07/2014  . Pneumococcal Conjugate-13 01/13/2015  . Pneumococcal Polysaccharide-23 04/12/2016  . Tdap 03/11/2011     Health Maintenance Due  Topic Date Due  . DEXA SCAN  08/18/2005     Functional Status Survey: Is the patient deaf or have difficulty hearing?: Yes(yes difficulty in left ear) Does the patient have difficulty seeing, even when wearing glasses/contacts?: No Does the patient have difficulty concentrating, remembering, or making decisions?: No Does the patient have difficulty walking or climbing stairs?: No Does the patient have difficulty dressing or bathing?: No Does the patient have difficulty doing errands alone such as visiting a doctor's office or shopping?: No   6CIT Screen 06/12/2019  What Year? 0 points  What month? 0 points  What time? 0 points  Count back from 20 0 points  Months in reverse 0 points  Repeat phrase 0 points  Total Score 0        Clinical Support from 06/12/2019 in [facility] at [place]  AUDIT-C Score  0       Home Environment:   Lives in two story home No trouble climbing stairs Gets  winded Yes scattered rugs Yes grab bars Adequate lighting/no clutter  Timed warm  N/A     Patient Active Problem List   Diagnosis Date Noted  . Long term (current) use of anticoagulants 11/24/2017  . Atrial fibrillation, chronic ([place]): CHA2DS2Vasc = 4, on Warfarin; CCB for Rate control 05/01/2013  . Bilateral lower extremity edema 05/01/2013  . Obesity (BMI 35.0-39.9 without comorbidity) -  02/26/2013  . Seasonal allergies 02/26/2013  . Long term current use of anticoagulant therapy 08/28/2012  . TIA (transient ischemic attack) 02/24/2012  . Hypothyroidism 02/05/2010  . Essential hypertension 02/05/2010     Past Medical History:  Diagnosis Date  . Atrial fibrillation, chronic ([place]): CHA2DS2Vasc = 4, on Warfarin; CCB for Rate control 05/01/2013   Previously on Flecainide - stopped due to recurrent Afib -- no longer effective. Did not tolerate Multaq.   This patients CHA2DS2-VASc Score and unadjusted Ischemic Stroke Rate (% per year) is equal to 4.8 % stroke rate/year from a score of 4 (HTN, Female, h/o TIA)    . Chronic fatigue   . Chronic pain   . COPD (chronic obstructive pulmonary disease) ([place])   . Dyspnea on exertion    A.) [place] Mycardial Perfusion with Wall Motion LVEF, Date:09/07/2011, TID ratio 1.03, lung to heart ratio 26%.  Marland Kitchen Hypothyroidism   . Obesity   . Seasonal allergies   . Stroke ([place])   . Thyroid disease    HYPOTHYROID  . TIA (transient ischemic attack) 2010   A.) Carotid Duplex study, 10/08/2008, minimal disease  Past Surgical History:  Procedure Laterality Date  . CARDIOVERSION N/A 08/16/2012   Procedure: CARDIOVERSION;  Surgeon: Marykay Lex, MD;  Location: [facility] ENDOSCOPY;  Service: Endoscopy;  Laterality: N/A;  labs on arrival   . CARDIOVERSION N/A 09/14/2012   Procedure: CARDIOVERSION;  Surgeon: Chrystie Nose, MD;  Location: [facility] ENDOSCOPY;  Service: Cardiovascular;  Laterality: N/A;  . 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 NM  MYOVIEW LTD  April 2013   No evidence of ischemia or infarction. LOW RISK;  normal EF  . TONSILLECTOMY    . TOTAL HIP ARTHROPLASTY  [id number]  . TRANSTHORACIC ECHOCARDIOGRAM  09-2012   Moderate concentric LVH. EF 60-65%.; Mild LA dilation.     Family History  Problem Relation Age of Onset  . Alcoholism Father        she did not know him  . Alcoholism Brother   . Healthy Sister        1/2 sister     Social History   Socioeconomic History  . Marital status: Divorced    Spouse name: Not on file  . Number of children: Not on file  . Years of education: Not on file  . Highest education level: Not on file  Occupational History  . Not on file  Tobacco Use  . Smoking status: Former Smoker    Packs/day: 2.00    Quit date: 06/07/2004    Years since quitting: 15.0  . Smokeless tobacco: Never Used  Substance and Sexual Activity  . Alcohol use: No    Comment: occasionally  . Drug use: No  . Sexual activity: Not Currently  Other Topics Concern  . Not on file  Social History Narrative   She is divorced, and is accompanied by her long-time companion/friend who is always with her. They are both transplants from [place] (she is from [place]).   She is a former smoker, who quit in 2006. She claims to never have "inhaled."   She does not get routine exercise but she and her friend are very active.   Social Determinants of Health   Financial Resource Strain:   . Difficulty of Paying Living Expenses: Not on file  Food Insecurity:   . Worried About Programme researcher, broadcasting/film/video in the Last Year: Not on file  . Ran Out of Food in the Last Year: Not on file  Transportation Needs:   . Lack of Transportation (Medical): Not on file  . Lack of Transportation (Non-Medical): Not on file  Physical Activity:   . Days of Exercise per Week: Not on file  . Minutes of Exercise per Session: Not on file  Stress:   . Feeling of Stress : Not on file  Social Connections:   . Frequency of Communication with  Friends and Family: Not on file  . Frequency of Social Gatherings with Friends and Family: Not on file  . Attends Religious Services: Not on file  . Active Member of Clubs or Organizations: Not on file  . Attends Banker Meetings: Not on file  . Marital Status: Not on file  Intimate Partner Violence:   . Fear of Current or Ex-Partner: Not on file  . Emotionally Abused: Not on file  . Physically Abused: Not on file  . Sexually Abused: Not on file     Allergies  Allergen Reactions  . Amiodarone Diarrhea     Prior to Admission medications   Medication Sig Start Date End Date Taking?  Authorizing Provider  albuterol (PROVENTIL HFA;VENTOLIN HFA) 108 (90 Base) MCG/ACT inhaler Inhale 2 puffs into the lungs every 4 (four) hours as needed for wheezing or shortness of breath (cough, shortness of breath or wheezing.). 08/22/18  Yes [name], MD  diltiazem (CARDIZEM CD) 240 MG 24 hr capsule Take 1 capsule (240 mg total) by mouth daily. 05/18/19  Yes Marykay Lex, MD  furosemide (LASIX) 40 MG tablet Take 1 tablet (40 mg total) by mouth daily. 05/18/19  Yes Marykay Lex, MD  levothyroxine (SYNTHROID) 100 MCG tablet Take 1 tablet (100 mcg total) by mouth daily. Needs lab visit for tsh. 05/24/19  Yes [name], MD  warfarin (COUMADIN) 2.5 MG tablet Take 1 tablet by mouth daily or as directed by coumadin clinic 05/22/19  Yes Marykay Lex, MD     Depression screen [facility] 2/9 06/12/2019 05/24/2019 04/19/2017 01/31/2017 01/25/2017  Decreased Interest 0 0 0 0 0  Down, Depressed, Hopeless 0 0 0 0 0  PHQ - 2 Score 0 0 0 0 0     Fall Risk  06/12/2019 05/24/2019 04/19/2017 01/31/2017 01/25/2017  Falls in the past year? 0 - No Yes Yes  Number falls in past yr: 0 0 - 2 or more -  Injury with Fall? 0 0 - No -  Risk for fall due to : History of fall(s) - - - -  Follow up Falls evaluation completed;Education provided - - - -      PHYSICAL EXAM: BP 118/70 Comment: taken  from previous visit  Ht 5\' 3"  (1.6 m)   Wt 193 lb (87.5 kg)   BMI 34.19 kg/m    Wt Readings from Last 3 Encounters:  06/12/19 193 lb (87.5 kg)  05/18/19 193 lb (87.5 kg)  10/16/18 197 lb (89.4 kg)       Education/Counseling provided regarding diet and exercise, prevention of chronic diseases, smoking/tobacco cessation, if applicable, and reviewed "Covered Medicare Preventive Services."

## 2019-06-13 ENCOUNTER — Ambulatory Visit (INDEPENDENT_AMBULATORY_CARE_PROVIDER_SITE_OTHER): Payer: Medicare Other | Admitting: Pharmacist

## 2019-06-13 DIAGNOSIS — I482 Chronic atrial fibrillation, unspecified: Secondary | ICD-10-CM | POA: Diagnosis not present

## 2019-06-13 DIAGNOSIS — Z7901 Long term (current) use of anticoagulants: Secondary | ICD-10-CM

## 2019-06-13 LAB — POCT INR: INR: 2.5 (ref 2.0–3.0)

## 2019-06-21 ENCOUNTER — Telehealth: Payer: Self-pay | Admitting: Cardiology

## 2019-06-21 NOTE — Telephone Encounter (Signed)
  We are recommending the COVID-19 vaccine to all of our patients. Cardiac medications (including blood thinners) should not deter anyone from being vaccinated and there is no need to hold any of those medications prior to vaccine administration. Currently, there is a hotline to call (active 06/15/19) to schedule vaccination appointments as no walk-ins will be accepted. Number: [phone] If you have further questions or concerns about the vaccine process, please visit [email or web address] or contact your primary care physician.  Pt verbalized understanding

## 2019-06-21 NOTE — Telephone Encounter (Signed)
Pt would like DR. Harding's advice as to whether or not she should get the COVID Vaccine, and where she can get it.  The patient states she does not have a computer and is unable to look up information online.

## 2019-06-26 DIAGNOSIS — H25013 Cortical age-related cataract, bilateral: Secondary | ICD-10-CM | POA: Diagnosis not present

## 2019-06-26 DIAGNOSIS — H401134 Primary open-angle glaucoma, bilateral, indeterminate stage: Secondary | ICD-10-CM | POA: Diagnosis not present

## 2019-06-26 DIAGNOSIS — H524 Presbyopia: Secondary | ICD-10-CM | POA: Diagnosis not present

## 2019-06-26 DIAGNOSIS — H43813 Vitreous degeneration, bilateral: Secondary | ICD-10-CM | POA: Diagnosis not present

## 2019-07-03 ENCOUNTER — Other Ambulatory Visit: Payer: Self-pay | Admitting: Family Medicine

## 2019-07-03 DIAGNOSIS — R062 Wheezing: Secondary | ICD-10-CM

## 2019-07-03 MED ORDER — ALBUTEROL SULFATE HFA 108 (90 BASE) MCG/ACT IN AERS: 2.0000 | INHALATION_SPRAY | RESPIRATORY_TRACT | 0 refills | Status: AC | PRN

## 2019-07-03 NOTE — Telephone Encounter (Signed)
Medication Refill - Medication:   albuterol (PROVENTIL HFA;VENTOLIN HFA) 108 (90 Base) MCG/ACT inhaler [[id number]]    Preferred Pharmacy (with phone number or street name):  [id number] [place], [place] - [address] AT [facility] OF [facility] [address]  [address] [place] [address]  Phone: [phone] Fax: [phone]     Agent: Please be advised that RX refills may take up to 3 business days. We ask that you follow-up with your pharmacy.

## 2019-07-03 NOTE — Telephone Encounter (Signed)
Requested Prescriptions  Pending Prescriptions Disp Refills  . albuterol (VENTOLIN HFA) 108 (90 Base) MCG/ACT inhaler      Sig: Inhale 2 puffs into the lungs every 4 (four) hours as needed for wheezing or shortness of breath (cough, shortness of breath or wheezing.).     Pulmonology:  Beta Agonists Failed - 07/03/2019 12:26 PM      Failed - One inhaler should last at least one month. If the patient is requesting refills earlier, contact the patient to check for uncontrolled symptoms.      Passed - Valid encounter within last 12 months    Recent Outpatient Visits          3 weeks ago Medicare annual wellness visit, subsequent   Primary Care at [facility], [place] A, MD   4 weeks ago Hypothyroidism, unspecified type   [facility] at [facility], Meda Coffee, MD   1 month ago Hypothyroidism, unspecified type   [facility] at [facility], Asencion Partridge, MD   2 years ago Hypothyroidism, unspecified type   [facility] at [facility], Asencion Partridge, MD   2 years ago Cough   [facility] at [facility], Asencion Partridge, MD

## 2019-07-07 ENCOUNTER — Other Ambulatory Visit: Payer: Self-pay | Admitting: Cardiology

## 2019-07-07 DIAGNOSIS — I482 Chronic atrial fibrillation, unspecified: Secondary | ICD-10-CM

## 2019-07-10 DIAGNOSIS — H401134 Primary open-angle glaucoma, bilateral, indeterminate stage: Secondary | ICD-10-CM | POA: Diagnosis not present

## 2019-07-19 DIAGNOSIS — H2512 Age-related nuclear cataract, left eye: Secondary | ICD-10-CM | POA: Diagnosis not present

## 2019-07-19 DIAGNOSIS — H25812 Combined forms of age-related cataract, left eye: Secondary | ICD-10-CM | POA: Diagnosis not present

## 2019-07-19 DIAGNOSIS — H25012 Cortical age-related cataract, left eye: Secondary | ICD-10-CM | POA: Diagnosis not present

## 2019-07-25 ENCOUNTER — Other Ambulatory Visit: Payer: Self-pay

## 2019-07-25 ENCOUNTER — Ambulatory Visit (INDEPENDENT_AMBULATORY_CARE_PROVIDER_SITE_OTHER): Payer: Medicare Other | Admitting: Pharmacist

## 2019-07-25 ENCOUNTER — Other Ambulatory Visit: Payer: Self-pay | Admitting: Family Medicine

## 2019-07-25 DIAGNOSIS — I482 Chronic atrial fibrillation, unspecified: Secondary | ICD-10-CM

## 2019-07-25 DIAGNOSIS — Z7901 Long term (current) use of anticoagulants: Secondary | ICD-10-CM | POA: Diagnosis not present

## 2019-07-25 DIAGNOSIS — E039 Hypothyroidism, unspecified: Secondary | ICD-10-CM

## 2019-07-25 LAB — POCT INR: INR: 3.4 — AB (ref 2.0–3.0)

## 2019-08-09 DIAGNOSIS — H25811 Combined forms of age-related cataract, right eye: Secondary | ICD-10-CM | POA: Diagnosis not present

## 2019-08-09 DIAGNOSIS — H2511 Age-related nuclear cataract, right eye: Secondary | ICD-10-CM | POA: Diagnosis not present

## 2019-08-09 DIAGNOSIS — H25011 Cortical age-related cataract, right eye: Secondary | ICD-10-CM | POA: Diagnosis not present

## 2019-09-10 ENCOUNTER — Other Ambulatory Visit: Payer: Self-pay

## 2019-09-10 ENCOUNTER — Ambulatory Visit (INDEPENDENT_AMBULATORY_CARE_PROVIDER_SITE_OTHER): Payer: Medicare Other | Admitting: Pharmacist Clinician (PhC)/ Clinical Pharmacy Specialist

## 2019-09-10 DIAGNOSIS — Z7901 Long term (current) use of anticoagulants: Secondary | ICD-10-CM | POA: Diagnosis not present

## 2019-09-10 DIAGNOSIS — I482 Chronic atrial fibrillation, unspecified: Secondary | ICD-10-CM | POA: Diagnosis not present

## 2019-09-10 LAB — POCT INR: INR: 3.2 — AB (ref 2.0–3.0)

## 2019-09-14 DIAGNOSIS — Z961 Presence of intraocular lens: Secondary | ICD-10-CM | POA: Diagnosis not present

## 2019-10-05 ENCOUNTER — Other Ambulatory Visit: Payer: Self-pay | Admitting: Cardiology

## 2019-10-10 ENCOUNTER — Ambulatory Visit (INDEPENDENT_AMBULATORY_CARE_PROVIDER_SITE_OTHER): Payer: Medicare Other | Admitting: Cardiovascular Disease

## 2019-10-10 ENCOUNTER — Telehealth: Payer: Self-pay | Admitting: Family Medicine

## 2019-10-10 ENCOUNTER — Other Ambulatory Visit: Payer: Self-pay

## 2019-10-10 DIAGNOSIS — Z7901 Long term (current) use of anticoagulants: Secondary | ICD-10-CM

## 2019-10-10 DIAGNOSIS — I482 Chronic atrial fibrillation, unspecified: Secondary | ICD-10-CM | POA: Diagnosis not present

## 2019-10-10 LAB — POCT INR: INR: 3.3 — AB (ref 2.0–3.0)

## 2019-10-10 NOTE — Telephone Encounter (Signed)
Pt got a covid shot on 10/02/19. Around Saturday her toe beside her big toe on her L foot turned red and then same toe on her other foot turned red. I offered her an appointment she would prefer a phone message to see what Dr. Neva Seat thinks. Please advise at [phone].

## 2019-10-10 NOTE — Telephone Encounter (Signed)
I called pt to schedule an appointment she is going to call back when she can get a ride.

## 2019-10-14 ENCOUNTER — Other Ambulatory Visit: Payer: Self-pay | Admitting: Cardiology

## 2019-11-09 ENCOUNTER — Ambulatory Visit (INDEPENDENT_AMBULATORY_CARE_PROVIDER_SITE_OTHER): Payer: Medicare Other | Admitting: Pharmacist Clinician (PhC)/ Clinical Pharmacy Specialist

## 2019-11-09 ENCOUNTER — Other Ambulatory Visit: Payer: Self-pay

## 2019-11-09 ENCOUNTER — Ambulatory Visit: Payer: Medicare Other | Admitting: Cardiology

## 2019-11-09 ENCOUNTER — Encounter: Payer: Self-pay | Admitting: Cardiology

## 2019-11-09 VITALS — HR 68 | Temp 97.1°F | Ht 63.0 in | Wt 198.0 lb

## 2019-11-09 DIAGNOSIS — I482 Chronic atrial fibrillation, unspecified: Secondary | ICD-10-CM

## 2019-11-09 DIAGNOSIS — I10 Essential (primary) hypertension: Secondary | ICD-10-CM | POA: Diagnosis not present

## 2019-11-09 DIAGNOSIS — Z7901 Long term (current) use of anticoagulants: Secondary | ICD-10-CM | POA: Diagnosis not present

## 2019-11-09 DIAGNOSIS — R600 Localized edema: Secondary | ICD-10-CM

## 2019-11-09 LAB — POCT INR: INR: 2.3 (ref 2.0–3.0)
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
## 2019-11-09 NOTE — Patient Instructions (Signed)
Medication Instructions:   no changes  If you have some swelling  You may take  1/2 tablet of Lasix  Extra  As needed   *If you need a refill on your cardiac medications before your next appointment, please call your pharmacy*   Lab Work: Not needed   Testing/Procedures: Not needed   Follow-Up: At [facility], you and your health needs are our priority.  As part of our continuing mission to provide you with exceptional heart care, we have created designated Provider Care Teams.  These Care Teams include your primary Cardiologist (physician) and Advanced Practice Providers (APPs -  Physician Assistants and Nurse Practitioners) who all work together to provide you with the care you need, when you need it.     Your next appointment:   6 month(s)  The format for your next appointment:   In Person  Provider:   Bryan Lemma, MD   Other Instructions  If legs become red contact  You primary to evaluate. Purchase some support stocking - over the counter - knee hi's

## 2019-11-16 ENCOUNTER — Encounter: Payer: Self-pay | Admitting: Cardiology

## 2019-12-19 DIAGNOSIS — H401123 Primary open-angle glaucoma, left eye, severe stage: Secondary | ICD-10-CM | POA: Diagnosis not present

## 2019-12-21 ENCOUNTER — Other Ambulatory Visit: Payer: Self-pay | Admitting: Family Medicine

## 2019-12-21 DIAGNOSIS — E039 Hypothyroidism, unspecified: Secondary | ICD-10-CM

## 2020-01-07 ENCOUNTER — Ambulatory Visit (INDEPENDENT_AMBULATORY_CARE_PROVIDER_SITE_OTHER): Payer: Medicare Other

## 2020-01-07 ENCOUNTER — Other Ambulatory Visit: Payer: Self-pay

## 2020-01-07 DIAGNOSIS — Z7901 Long term (current) use of anticoagulants: Secondary | ICD-10-CM | POA: Diagnosis not present

## 2020-01-07 DIAGNOSIS — I482 Chronic atrial fibrillation, unspecified: Secondary | ICD-10-CM

## 2020-01-07 LAB — POCT INR: INR: 2.1 (ref 2.0–3.0)

## 2020-01-07 NOTE — Patient Instructions (Signed)
Continue taking 1.25 mg daily except 2.5 mg each Sunday and Thursday.  Repeat INR in 6 weeks 

## 2020-01-23 ENCOUNTER — Other Ambulatory Visit: Payer: Self-pay | Admitting: Cardiology

## 2020-02-13 DIAGNOSIS — H00015 Hordeolum externum left lower eyelid: Secondary | ICD-10-CM | POA: Diagnosis not present

## 2020-02-18 ENCOUNTER — Other Ambulatory Visit: Payer: Self-pay

## 2020-02-18 ENCOUNTER — Ambulatory Visit (INDEPENDENT_AMBULATORY_CARE_PROVIDER_SITE_OTHER): Payer: Medicare Other

## 2020-02-18 DIAGNOSIS — Z7901 Long term (current) use of anticoagulants: Secondary | ICD-10-CM | POA: Diagnosis not present

## 2020-02-18 DIAGNOSIS — I482 Chronic atrial fibrillation, unspecified: Secondary | ICD-10-CM | POA: Diagnosis not present

## 2020-02-18 LAB — POCT INR: INR: 2.3 (ref 2.0–3.0)

## 2020-02-18 NOTE — Patient Instructions (Signed)
Continue taking 1.25 mg daily except 2.5 mg each Sunday and Thursday.  Repeat INR in 6 weeks.

## 2020-03-25 ENCOUNTER — Other Ambulatory Visit: Payer: Self-pay | Admitting: Cardiology

## 2020-04-07 ENCOUNTER — Other Ambulatory Visit: Payer: Self-pay

## 2020-04-07 ENCOUNTER — Ambulatory Visit (INDEPENDENT_AMBULATORY_CARE_PROVIDER_SITE_OTHER): Payer: Medicare Other

## 2020-04-07 DIAGNOSIS — Z7901 Long term (current) use of anticoagulants: Secondary | ICD-10-CM

## 2020-04-07 DIAGNOSIS — I482 Chronic atrial fibrillation, unspecified: Secondary | ICD-10-CM

## 2020-04-07 LAB — POCT INR: INR: 2.2 (ref 2.0–3.0)

## 2020-04-07 NOTE — Patient Instructions (Signed)
Continue taking 1.25 mg daily except 2.5 mg each Sunday and Thursday.  Repeat INR in 4 weeks.

## 2020-05-07 ENCOUNTER — Ambulatory Visit (INDEPENDENT_AMBULATORY_CARE_PROVIDER_SITE_OTHER): Payer: Medicare Other

## 2020-05-07 ENCOUNTER — Other Ambulatory Visit: Payer: Self-pay

## 2020-05-07 ENCOUNTER — Ambulatory Visit: Payer: Medicare Other | Admitting: Cardiology

## 2020-05-07 ENCOUNTER — Encounter: Payer: Self-pay | Admitting: Cardiology

## 2020-05-07 VITALS — BP 122/72 | HR 68 | Ht 63.0 in | Wt 181.8 lb

## 2020-05-07 DIAGNOSIS — J302 Other seasonal allergic rhinitis: Secondary | ICD-10-CM

## 2020-05-07 DIAGNOSIS — R600 Localized edema: Secondary | ICD-10-CM | POA: Diagnosis not present

## 2020-05-07 DIAGNOSIS — I4821 Permanent atrial fibrillation: Secondary | ICD-10-CM

## 2020-05-07 DIAGNOSIS — I10 Essential (primary) hypertension: Secondary | ICD-10-CM | POA: Diagnosis not present

## 2020-05-07 DIAGNOSIS — I482 Chronic atrial fibrillation, unspecified: Secondary | ICD-10-CM

## 2020-05-07 DIAGNOSIS — G459 Transient cerebral ischemic attack, unspecified: Secondary | ICD-10-CM

## 2020-05-07 DIAGNOSIS — Z7901 Long term (current) use of anticoagulants: Secondary | ICD-10-CM

## 2020-05-07 DIAGNOSIS — E669 Obesity, unspecified: Secondary | ICD-10-CM

## 2020-05-07 LAB — POCT INR: INR: 2.9 (ref 2.0–3.0)

## 2020-05-07 NOTE — Progress Notes (Signed)
Primary Care Provider: [name], MD Cardiologist: Bryan Lemma, MD Electrophysiologist: None  Clinic Note: Chief Complaint  Patient presents with  . Follow-up    75-month  . Atrial Fibrillation    Permanent-Asymptomatic    HPI:    [name] is a 79 y.o. female with a PMH notable for permanent A. fib who presents today for 42-month follow-up.  [name] was last seen on November 09, 2019-I do still really noticing being in A. fib or not.  Little more deconditioned.  No exercise.  Limited now by joint pains and legs (hips and knees).  No CHF symptoms of PND orthopnea with mild edema.  Exertional dyspnea due to deconditioning.  Does not like to take extra Lasix because of frequent urination.  Recent Hospitalizations: n/a  Reviewed  CV studies:    The following studies were reviewed today: (if available, images/films reviewed: From Epic Chart or Care Everywhere) . n/a:  Interval History:   [name] returns today pretty much stable.  She has been having a lot of issues with psoriasis/Psoriatic arthritis of late.Not sure who she should see to discuss options-recommend suggest rheumatology or dermatology.  She had some URI symptoms shortly after her Covid vaccine, but has recovered.  Poor sleep-insomnia and fatigue noted.  Mostly because of stress about her living situation.  Does not get any type of activity or exercise.  Now transferred to exercise.  She is fearful of walking up and down street.  No sensation of being in A. fib either irregular or rapid rate.  No PND orthopnea, but stable lower extremity edema.  CV Review of Symptoms (Summary): positive for - dyspnea on exertion, edema, shortness of breath and Fatigue, lack of energy. negative for - chest pain, irregular heartbeat, orthopnea, palpitations, paroxysmal nocturnal dyspnea, rapid heart rate or Syncope/near syncope or TIA/ amaurosis fugax, claudication  The patient does not have symptoms concerning for  COVID-19 infection (fever, chills, cough, or new shortness of breath).   REVIEWED OF SYSTEMS   Review of Systems  Constitutional: Positive for malaise/fatigue (not a lot of Energy - just not doing much) and weight loss (not eating as well).  HENT: Negative for congestion and nosebleeds.   Respiratory: Positive for shortness of breath (Exertional). Negative for cough.   Gastrointestinal: Negative for abdominal pain, blood in stool and melena.  Genitourinary: Negative for hematuria.  Musculoskeletal: Positive for back pain and joint pain (hips & knees).  Skin:       Mild psoriasis rash   Neurological: Negative for dizziness (Occasional orthostatic symptoms.) and focal weakness.  Endo/Heme/Allergies: Positive for environmental allergies (Will have congestion, sneezing and coughing.).  Psychiatric/Behavioral: Negative for depression (at least dysthymic) and memory loss. The patient is nervous/anxious and has insomnia.    I have reviewed and (if needed) personally updated the patient's problem list, medications, allergies, past medical and surgical history, social and family history.   PAST MEDICAL HISTORY   Past Medical History:  Diagnosis Date  . Atrial fibrillation, chronic (HCC): CHA2DS2Vasc = 4, on Warfarin; CCB for Rate control 05/01/2013   Previously on Flecainide - stopped due to recurrent Afib -- no longer effective. Did not tolerate Multaq.   This patients CHA2DS2-VASc Score and unadjusted Ischemic Stroke Rate (% per year) is equal to 4.8 % stroke rate/year from a score of 4 (HTN, Female, h/o TIA)    . Chronic fatigue   . Chronic pain   . COPD (chronic obstructive pulmonary disease) (  HCC)   . Dyspnea on exertion    A.) Lexican Mycardial Perfusion with Wall Motion LVEF, Date:09/07/2011, TID ratio 1.03, lung to heart ratio 26%.  Marland Kitchen. Hypothyroidism   . Obesity   . Seasonal allergies   . Stroke (HCC)   . Thyroid disease    HYPOTHYROID  . TIA (transient ischemic attack) 2010   A.)  Carotid Duplex study, 10/08/2008, minimal disease    PAST SURGICAL HISTORY   Past Surgical History:  Procedure Laterality Date  . CARDIOVERSION N/A 08/16/2012   Procedure: CARDIOVERSION;  Surgeon: Marykay Lexavid W Jodell Weitman, MD;  [facility];  Service: Endoscopy;  Laterality: N/A;  labs on arrival   . CARDIOVERSION N/A 09/14/2012   Procedure: CARDIOVERSION;  Surgeon: Chrystie NoseKenneth C. Hilty, MD;  [facility];  Service: Cardiovascular;  Laterality: N/A;  . 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 NM MYOVIEW LTD  April 2013   No evidence of ischemia or infarction. LOW RISK;  normal EF  . TONSILLECTOMY    . TOTAL HIP ARTHROPLASTY  [id number],2012  . TRANSTHORACIC ECHOCARDIOGRAM  09-2012   Moderate concentric LVH. EF 60-65%.; Mild LA dilation.    Immunization History  Administered Date(s) Administered  . Influenza Whole 03/03/2011  . Influenza-Unspecified 03/07/2014  . Pneumococcal Conjugate-13 01/13/2015  . Pneumococcal Polysaccharide-23 04/12/2016  . Tdap 03/11/2011    MEDICATIONS/ALLERGIES   Current Meds  Medication Sig  . albuterol (VENTOLIN HFA) 108 (90 Base) MCG/ACT inhaler Inhale 2 puffs into the lungs every 4 (four) hours as needed for wheezing or shortness of breath (cough, shortness of breath or wheezing.).  Marland Kitchen. CARTIA XT 240 MG 24 hr capsule TAKE 1 CAPSULE BY MOUTH  DAILY  . furosemide (LASIX) 40 MG tablet TAKE 1 TABLET BY MOUTH  DAILY  . latanoprost (XALATAN) 0.005 % ophthalmic solution SMARTSIG:In Eye(s)  . levothyroxine (SYNTHROID) 100 MCG tablet TAKE 1 TABLET BY MOUTH  DAILY  . ofloxacin (OCUFLOX) 0.3 % ophthalmic solution Place into the left eye.  . timolol (TIMOPTIC) 0.5 % ophthalmic solution SMARTSIG:In Eye(s)  . warfarin (COUMADIN) 2.5 MG tablet TAKE 1 TABLET BY MOUTH  DAILY OR AS DIRECTED BY  COUMADIN CLINIC    Allergies  Allergen Reactions  . Amiodarone Diarrhea    SOCIAL HISTORY/FAMILY HISTORY   Reviewed in Epic:  Pertinent findings: Still living  with her roommate - very stressful.    OBJCTIVE -PE, EKG, labs   Wt Readings from Last 3 Encounters:  05/07/20 181 lb 12.8 oz (82.5 kg)  11/09/19 198 lb (89.8 kg)  06/12/19 193 lb (87.5 kg)   Physical Exam: BP 122/72   Pulse 68   Ht 5\' 3"  (1.6 m)   Wt 181 lb 12.8 oz (82.5 kg)   BMI 32.20 kg/m  Physical Exam Vitals reviewed.  Constitutional:      General: She is not in acute distress.    Appearance: She is obese. She is not ill-appearing or toxic-appearing.     Comments: Well-groomed.  Healthy-appearing.  Neck:     Vascular: No carotid bruit, hepatojugular reflux or JVD.  Cardiovascular:     Rate and Rhythm: Normal rate. Rhythm irregularly irregular.  No extrasystoles are present.    Chest Wall: PMI is not displaced.     Pulses: Decreased pulses (Decreased pedal pulses.).     Heart sounds: S1 normal and S2 normal. Heart sounds are distant. Murmur heard.  High-pitched blowing decrescendo holosystolic murmur is present at the apex. No friction rub. No gallop.  Pulmonary:     Effort: Pulmonary effort is normal. No respiratory distress.     Breath sounds: Normal breath sounds.  Chest:     Chest wall: No tenderness.  Musculoskeletal:        General: Swelling (Trivial) present. Normal range of motion.     Cervical back: Normal range of motion and neck supple.  Neurological:     General: No focal deficit present.     Mental Status: She is alert and oriented to person, place, and time. Mental status is at baseline.  Psychiatric:        Behavior: Behavior normal.        Thought Content: Thought content normal.        Judgment: Judgment normal.     Comments: Dysthymic mood    Adult ECG Report  Rate: 68 ;  Rhythm: atrial fibrillation and Low voltage.  Otherwise normal axis, intervals and durations.;   Narrative Interpretation: Stable EKG.  Recent Labs: Not available Lab Results  Component Value Date   CHOL 172 07/19/2011   HDL 69 07/19/2011   LDLCALC 95 07/19/2011    TRIG 39 07/19/2011   CHOLHDL 2.5 07/19/2011   Lab Results  Component Value Date   CREATININE 0.97 04/19/2017   BUN 9 04/19/2017   NA 141 04/19/2017   K 3.6 04/19/2017   CL 100 04/19/2017   CO2 29 04/19/2017   Lab Results  Component Value Date   TSH 0.569 06/04/2019    ASSESSMENT/PLAN    Problem List Items Addressed This Visit    Obesity (BMI 35.0-39.9 without comorbidity) -  (Chronic)    She is not sure if this is simply because her diet has gone down if she is actually making her efforts to change her diet, and has lost weight.  Now at this point need to monitor to make sure that she is not losing protein.      Permanent atrial fibrillation (HCC): CHA2DS2Vasc = 4, on Warfarin; CCB for Rate control - Primary (Chronic)    Rate controlled with moderate diltiazem dose.  No longer on any antiarrhythmics.    No sensation of irregular heartbeats or tachycardia. On warfarin for anticoagulation-mostly for financial reasons.  DOAC options were too expensive.      Relevant Orders   EKG 12-Lead (Completed)   Bilateral lower extremity edema (Chronic)    She says that her swelling comes and goes.  Currently minimal edema.  She is on STEMI is a furosemide.  We talked about sliding scale additional dosing as needed.  She has been reluctant to even think about compression stockings.      Essential hypertension (Chronic)    Blood pressure well controlled 1 diltiazem and furosemide.      Relevant Orders   EKG 12-Lead (Completed)   TIA (transient ischemic attack) (Chronic)    No recurrence.  On warfarin with elevated CHA2DS2-VASc score is noted.      Seasonal allergies (Chronic)    At this point time is not recently limiting, but can be bad.  She does have a albuterol inhalers, but does not have standing H2 blocker.  Talked about over-the-counter H2 blockers.      Long term current use of anticoagulant therapy (Chronic)    On warfarin.  Stable.          COVID-19  Education: The signs and symptoms of COVID-19 were discussed with the patient and how to seek care for testing (follow up with PCP or arrange E-visit).  The importance of social distancing and COVID-19 vaccination was discussed today. 1 min The patient is practicing social distancing & Masking.   I spent a total of with the patient spent in direct patient consultation.  Additional time spent with chart review  / charting (studies, outside notes, etc): 6 Total Time: 50 min   Current medicines are reviewed at length with the patient today.  (+/- concerns) n/a  This visit occurred during the SARS-CoV-2 public health emergency.  Safety protocols were in place, including screening questions prior to the visit, additional usage of staff PPE, and extensive cleaning of exam room while observing appropriate contact time as indicated for disinfecting solutions.  Notice: This dictation was prepared with Dragon dictation along with smaller phrase technology. Any transcriptional errors that result from this process are unintentional and may not be corrected upon review.  Patient Instructions / Medication Changes & Studies & Tests Ordered   Patient Instructions  Medication Instructions:  No changes *If you need a refill on your cardiac medications before your next appointment, please call your pharmacy*   Lab Work: Not needed   Testing/Procedures: Not needed   Follow-Up: At [facility], you and your health needs are our priority.  As part of our continuing mission to provide you with exceptional heart care, we have created designated Provider Care Teams.  These Care Teams include your primary Cardiologist (physician) and Advanced Practice Providers (APPs -  Physician Assistants and Nurse Practitioners) who all work together to provide you with the care you need, when you need it.  We recommend signing up for the patient portal called "MyChart".  Sign up information is provided on  this After Visit Summary.  MyChart is used to connect with patients for Virtual Visits (Telemedicine).  Patients are able to view lab/test results, encounter notes, upcoming appointments, etc.  Non-urgent messages can be sent to your provider as well.   To learn more about what you can do with MyChart, go to [email or web address].    Your next appointment:   6 month(s)  The format for your next appointment:   In Person  Provider:   Bryan Lemma, MD   Other Instructions   Studies Ordered:   Orders Placed This Encounter  Procedures  . EKG 12-Lead     Bryan Lemma, M.D., M.S. Interventional Cardiologist   Pager # [phone] Phone # [phone] [address] [place], [place] [address]   Thank you for choosing Heartcare at [facility]!!

## 2020-05-07 NOTE — Patient Instructions (Signed)
Medication Instructions:  No changes *If you need a refill on your cardiac medications before your next appointment, please call your pharmacy*   Lab Work: Not needed   Testing/Procedures: Not needed   Follow-Up: At [facility], you and your health needs are our priority.  As part of our continuing mission to provide you with exceptional heart care, we have created designated Provider Care Teams.  These Care Teams include your primary Cardiologist (physician) and Advanced Practice Providers (APPs -  Physician Assistants and Nurse Practitioners) who all work together to provide you with the care you need, when you need it.  We recommend signing up for the patient portal called "MyChart".  Sign up information is provided on this After Visit Summary.  MyChart is used to connect with patients for Virtual Visits (Telemedicine).  Patients are able to view lab/test results, encounter notes, upcoming appointments, etc.  Non-urgent messages can be sent to your provider as well.   To learn more about what you can do with MyChart, go to [email or web address].    Your next appointment:   6 month(s)  The format for your next appointment:   In Person  Provider:   Bryan Lemma, MD   Other Instructions

## 2020-05-07 NOTE — Patient Instructions (Signed)
Continue taking 1.25 mg daily except 2.5 mg each Sunday and Thursday.  Repeat INR in 6 weeks

## 2020-05-19 ENCOUNTER — Encounter: Payer: Self-pay | Admitting: Cardiology

## 2020-05-19 NOTE — Assessment & Plan Note (Signed)
On warfarin. Stable 

## 2020-05-19 NOTE — Assessment & Plan Note (Signed)
She is not sure if this is simply because her diet has gone down if she is actually making her efforts to change her diet, and has lost weight.  Now at this point need to monitor to make sure that she is not losing protein.

## 2020-05-19 NOTE — Assessment & Plan Note (Signed)
No recurrence.  On warfarin with elevated CHA2DS2-VASc score is noted.

## 2020-05-19 NOTE — Assessment & Plan Note (Addendum)
Rate controlled with moderate diltiazem dose.  No longer on any antiarrhythmics.    No sensation of irregular heartbeats or tachycardia. On warfarin for anticoagulation-mostly for financial reasons.  DOAC options were too expensive.

## 2020-05-19 NOTE — Assessment & Plan Note (Signed)
Blood pressure well controlled 1 diltiazem and furosemide.

## 2020-05-19 NOTE — Assessment & Plan Note (Addendum)
She says that her swelling comes and goes.  Currently minimal edema.  She is on STEMI is a furosemide.  We talked about sliding scale additional dosing as needed.  She has been reluctant to even think about compression stockings.

## 2020-05-19 NOTE — Assessment & Plan Note (Signed)
At this point time is not recently limiting, but can be bad.  She does have a albuterol inhalers, but does not have standing H2 blocker.  Talked about over-the-counter H2 blockers.

## 2020-06-12 ENCOUNTER — Other Ambulatory Visit: Payer: Self-pay | Admitting: Cardiology

## 2020-06-12 ENCOUNTER — Other Ambulatory Visit: Payer: Self-pay | Admitting: Family Medicine

## 2020-06-12 DIAGNOSIS — E039 Hypothyroidism, unspecified: Secondary | ICD-10-CM

## 2020-06-12 NOTE — Telephone Encounter (Signed)
Please review for refill. Thanks!  

## 2020-06-12 NOTE — Telephone Encounter (Signed)
Requested medication (s) are due for refill today:  no  Requested medication (s) are on the active medication list: yes  Last refill:  04/11/2020  Future visit scheduled: no  Notes to clinic:  overdue for office visit    Requested Prescriptions  Pending Prescriptions Disp Refills   levothyroxine (SYNTHROID) 100 MCG tablet [Pharmacy Med Name: MYLAN-LEVOTHYROX TABLET] 90 tablet 3    Sig: TAKE 1 TABLET BY MOUTH  DAILY      Endocrinology:  Hypothyroid Agents Failed - 06/12/2020  5:14 AM      Failed - TSH needs to be rechecked within 3 months after an abnormal result. Refill until TSH is due.      Failed - TSH in normal range and within 360 days    TSH  Date Value Ref Range Status  06/04/2019 0.569 0.450 - 4.500 uIU/mL Final          Failed - Valid encounter within last 12 months    Recent Outpatient Visits           1 year ago Medicare annual wellness visit, subsequent   Primary Care at [facility], Manus Rudd, MD   1 year ago Hypothyroidism, unspecified type   Primary Care at [facility], Meda Coffee, MD   1 year ago Hypothyroidism, unspecified type   Primary Care at [facility], Asencion Partridge, MD   3 years ago Hypothyroidism, unspecified type   Primary Care at [facility], Asencion Partridge, MD   3 years ago Cough   Primary Care at [facility], Asencion Partridge, MD       Future Appointments             In 4 months Herbie Baltimore Piedad Climes, MD [facility]

## 2020-06-25 ENCOUNTER — Telehealth: Payer: Self-pay

## 2020-06-25 NOTE — Telephone Encounter (Signed)
Called and spoke w/pt regarding the overdue inr heck and the pt stated that they do not know when they can make it in and will call us back at he rdicressionb/c she does not drive

## 2020-07-15 ENCOUNTER — Telehealth: Payer: Self-pay | Admitting: Licensed Clinical Social Worker

## 2020-07-15 NOTE — Progress Notes (Signed)
Heart and Vascular Care Navigation  07/15/2020  [name] 1941/01/17 [id number]  Reason for Referral:  Transportation                                                                                             Assessment:   LCSW received transportation referral from PharmD [name]. Pt usually had her partner take her too appointments but unfortunately since her dementia has progressed pt can no longer have her drive.   LCSW reached out to pt via telephone at [phone]. Introduced self, role, reason for call. Pt confirmed home address, PCP and added her niece [name] as an additional emergency. She has multiple nieces in the [place] area and a son in [place]. She never learned how to formally drive therefore since the loss of her partner driving she has been limited in her ability to get around. She is able to afford her medications, living expenses and food at this time. She gets her medications delivered through [employer] delivery. Pt utilizes mostly a cane, but has a walker too as needed.   I explained what our transportation services through [facility] is able to assist with and pt verbally agrees to be referred and understands that they will call and complete waiver/screen with her.   I also offered to send my card, transportation services card and additional transportation options here in [place]. Pt took my name and number for any additional questions that may arise in the meantime.                                      HRT/VAS Care Coordination    Patients Home Cardiology Office [facility]   Outpatient Care Team Social Worker   Social Worker Name: [name] [place], [phone]   Living arrangements for the past 2 months Single Family Home   Lives with: Significant Other   Patient Current Insurance Coverage Managed Medicare   Patient Has Concern With Paying Medical Bills No   Does Patient Have Prescription Coverage? Yes   Home Assistive  Devices/Equipment Cane (specify quad or straight); Walker (specify type)      Social History:                                                                             SDOH Screenings   Alcohol Screen: Not on file  Depression (PHQ2-9): Not on file  Financial Resource Strain: Low Risk   . Difficulty of Paying Living Expenses: Not hard at all  Food Insecurity: No Food Insecurity  . Worried About Programme researcher, broadcasting/film/video in the Last Year: Never true  . Ran Out of Food in the Last Year: Never true  Housing: Low Risk   . Last Housing Risk  Score: 0  Physical Activity: Not on file  Social Connections: Not on file  Stress: Not on file  Tobacco Use: Medium Risk  . Smoking Tobacco Use: Former Smoker  . Smokeless Tobacco Use: Never Used  Transportation Needs: Unmet Transportation Needs  . Lack of Transportation (Medical): Yes  . Lack of Transportation (Non-Medical): Yes    SDOH Interventions: Financial Resources:  Corporate treasurer Interventions: Intervention Not Indicated  Food Insecurity:  Food Insecurity Interventions: Intervention Not Indicated  Housing Insecurity:  Housing Interventions: Intervention Not Indicated  Transportation:   Transportation Interventions: Retail banker    Follow-up plan:   LCSW sent referral to [employer] department.  I have mailed the resources noted above. I also updated [name], PharmD, so that she can schedule additional needed appts with pt. Available for any additional needs that may arise.

## 2020-07-16 ENCOUNTER — Telehealth: Payer: Self-pay | Admitting: Family Medicine

## 2020-07-16 NOTE — Telephone Encounter (Signed)
   SHARLEY KEELER DOB: 1940-08-29 MRN: [id number]   RIDER WAIVER AND RELEASE OF LIABILITY  For purposes of improving physical access to our facilities, [facility] is pleased to partner with third parties to provide [facility] patients or other authorized individuals the option of convenient, on-demand ground transportation services (the Chiropractor") through use of the technology service that enables users to request on-demand ground transportation from independent third-party providers.  By opting to use and accept these [employer], I, the undersigned, hereby agree on behalf of myself, and on behalf of any minor child using the [employer] for whom I am the parent or legal guardian, as follows:  1. Science writer provided to me are provided by independent third-party transportation providers who are not [employer] or employees and who are unaffiliated with [employer]. 2. [facility] is neither a transportation carrier nor a common or public carrier. 3. [facility] has no control over the quality or safety of the transportation that occurs as a result of the [employer]. 4. [facility] cannot guarantee that any third-party transportation provider will complete any arranged transportation service. 5. [facility] makes no representation, warranty, or guarantee regarding the reliability, timeliness, quality, safety, suitability, or availability of any of the Transport Services or that they will be error free. 6. I fully understand that traveling by vehicle involves risks and dangers of serious bodily injury, including permanent disability, paralysis, and death. I agree, on behalf of myself and on behalf of any minor child using the Transport Services for whom I am the parent or legal guardian, that the entire risk arising out of my use of the [employer] remains solely with me, to the maximum extent permitted under applicable law. 7. The [employer]  are provided "as is" and "as available." [facility] disclaims all representations and warranties, express, implied or statutory, not expressly set out in these terms, including the implied warranties of merchantability and fitness for a particular purpose. 8. I hereby waive and release [facility], its agents, employees, officers, directors, representatives, insurers, attorneys, assigns, successors, subsidiaries, and affiliates from any and all past, present, or future claims, demands, liabilities, actions, causes of action, or suits of any kind directly or indirectly arising from acceptance and use of the [employer]. 9. I further waive and release  and its affiliates from all present and future liability and responsibility for any injury or death to persons or damages to property caused by or related to the use of the [employer]. 10. I have read this Waiver and Release of Liability, and I understand the terms used in it and their legal significance. This Waiver is freely and voluntarily given with the understanding that my right (as well as the right of any minor child for whom I am the parent or legal guardian using the [employer]) to legal recourse against  in connection with the [employer] is knowingly surrendered in return for use of these services.   I attest that I read the consent document to [name], gave Ms. Dady the opportunity to ask questions and answered the questions asked (if any). I affirm that DAVELYN GWINN then provided consent for she's participation in this program.     [name]

## 2020-07-28 ENCOUNTER — Telehealth: Payer: Self-pay | Admitting: Licensed Clinical Social Worker

## 2020-07-28 NOTE — Telephone Encounter (Signed)
LCSW called and left HIPAA compliant message for pt at [phone]. I wanted to be sure she felt comfortable with ride service for appt this Friday. Pt called this writer back and shared that she feels fine with scheduling her rides. No additional needs at this time.   Octavio Graves, MSW, LCSW [facility] Heart/Vascular Care Navigation  [phone]

## 2020-08-01 ENCOUNTER — Other Ambulatory Visit: Payer: Self-pay

## 2020-08-01 ENCOUNTER — Ambulatory Visit (INDEPENDENT_AMBULATORY_CARE_PROVIDER_SITE_OTHER): Payer: Medicare Other

## 2020-08-01 DIAGNOSIS — Z7901 Long term (current) use of anticoagulants: Secondary | ICD-10-CM | POA: Diagnosis not present

## 2020-08-01 DIAGNOSIS — I482 Chronic atrial fibrillation, unspecified: Secondary | ICD-10-CM

## 2020-08-01 DIAGNOSIS — Z5181 Encounter for therapeutic drug level monitoring: Secondary | ICD-10-CM

## 2020-08-01 DIAGNOSIS — I4821 Permanent atrial fibrillation: Secondary | ICD-10-CM | POA: Diagnosis not present

## 2020-08-01 LAB — POCT INR: INR: 1.9 — AB (ref 2.0–3.0)

## 2020-08-01 NOTE — Patient Instructions (Signed)
Take 1 tablet tonight only and then Continue taking 1.25 mg daily except 2.5 mg each Thursday and Saturday.  Repeat INR in 6 weeks.

## 2020-08-04 ENCOUNTER — Telehealth: Payer: Self-pay | Admitting: Licensed Clinical Social Worker

## 2020-08-04 NOTE — Telephone Encounter (Signed)
Spoke with pt, she reports after lifting 82 lbs of cat liter, 5 cases of cat food and case of water she has a discomfort in the center of her chest. She has had this type of pain before and it went away on its own. She reports it has been constant since Saturday. Nothing makes it worse or better, touching the area it is tender. She will try taking tylenol or 1 dose of aleve to see if that helps her pain. She will call back if she continues to have discomfort.

## 2020-08-04 NOTE — Telephone Encounter (Signed)
LCSW received call from pt this afternoon from [phone], she shares that she didn't know who to call so she called me for advice. Pt shares that she has had some pain in between her breasts off and on throughout the weekend, most notably after lifting and tugging cat litter. She did have one episode at night of difficulty breathing, but that has since resolved. I inquired to RN Triage what is best next steps for pt- they request that I route the concerns to them and advise pt if chest pain continues/worsens to call 911 and go to the hospital. Pt was advised of such and this message has been routed to RN Triage.   Octavio Graves, MSW, LCSW [facility] Heart/Vascular Care Navigation  [phone]

## 2020-09-01 ENCOUNTER — Telehealth: Payer: Self-pay | Admitting: Licensed Clinical Social Worker

## 2020-09-01 ENCOUNTER — Other Ambulatory Visit: Payer: Self-pay | Admitting: Family Medicine

## 2020-09-01 DIAGNOSIS — E039 Hypothyroidism, unspecified: Secondary | ICD-10-CM

## 2020-09-01 NOTE — Progress Notes (Signed)
Heart and Vascular Care Navigation  09/01/2020  DAVIN MURAMOTO [birth year] [id number]  Reason for Referral:    Engaged with patient by telephone for follow up visit for Heart and Vascular Care Coordination.                                                                                                   Assessment:           Pt called this Clinical research associate sharing that she received a letter from her PCP office stating that her PCP is leaving the practice and his last day was last Friday. Pt not sure what to do about her medications he manages and where to turn next.   LCSW called pt back and was able to reach her on her cell phone. Pt shared the above information again, she shares that [place], PharmD, had given her information on next steps for her medications. I offered to send pt information about primary care offices that may be of help to her. Pt in agreement. Belenda Cruise is off today but I will f/u with her when she returns to the office.                             HRT/VAS Care Coordination    Patients Home Cardiology Office [facility]   Outpatient Care Team Social Worker   Social Worker Name: Esmeralda [facility] [place], [phone]   Living arrangements for the past 2 months Single Family Home   Lives with: Significant Other   Patient Current Insurance Coverage Managed Medicare   Patient Has Concern With Paying Medical Bills No   Does Patient Have Prescription Coverage? Yes   Home Assistive Devices/Equipment Cane (specify quad or straight); Walker (specify type)      Social History:                                                                             SDOH Screenings   Alcohol Screen: Not on file  Depression (PHQ2-9): Not on file  Financial Resource Strain: Low Risk   . Difficulty of Paying Living Expenses: Not hard at all  Food Insecurity: No Food Insecurity  . Worried About Programme researcher, broadcasting/film/video in the Last Year: Never true  . Ran Out of Food in the Last  Year: Never true  Housing: Low Risk   . Last Housing Risk Score: 0  Physical Activity: Not on file  Social Connections: Not on file  Stress: Not on file  Tobacco Use: Medium Risk  . Smoking Tobacco Use: Former Smoker  . Smokeless Tobacco Use: Never Used  Transportation Needs: Unmet Transportation Needs  . Lack of Transportation (Medical): Yes  . Lack of Transportation (Non-Medical): Yes    Other Care Navigation  Interventions:     Patient Referred to: Pt mailed PCP information   Follow-up plan:   LCSW mailed primary care list, [facility] and [facility] list. I will f/u with pt in 1-2 weeks to f/u on the information sent.

## 2020-09-09 ENCOUNTER — Telehealth: Payer: Self-pay | Admitting: Family Medicine

## 2020-09-09 NOTE — Telephone Encounter (Signed)
Pt called in asking for a refill on the levothyroxine to be sent to [facility]   Please advise

## 2020-09-10 NOTE — Telephone Encounter (Signed)
Dr. Neva Seat put a note for no refills without office visits. Will call and try to schedule an appointment

## 2020-09-10 NOTE — Telephone Encounter (Signed)
Attempted to call patient to try and schedule appointment in order to receive the medication refill.

## 2020-09-11 ENCOUNTER — Other Ambulatory Visit: Payer: Self-pay

## 2020-09-11 DIAGNOSIS — E038 Other specified hypothyroidism: Secondary | ICD-10-CM

## 2020-09-11 NOTE — Addendum Note (Signed)
Addended by: Eather Colas on: 09/11/2020 04:26 PM   Modules accepted: Orders

## 2020-09-11 NOTE — Telephone Encounter (Signed)
Attempted to call patient again to see if she could possibly like to schedule an virtual and got to a lab to have her labs done since the patient is not planning to come into this office. Patient needs lab work done due to not being seen in office since 06/2019, and need thyroid levels checked to make sure she is getting the right dose of medication. Dr Neva Seat is currently on vacation and said no further refills without a visit and the patient has declined more than once.

## 2020-09-11 NOTE — Telephone Encounter (Signed)
Patient called and said that she needs her refills - her heart doctor told her that Dr. Neva Seat had to fill her prescriptions for 3 months to give her time to find another doctor.  Patient states that she will not schedule an appointment here.

## 2020-09-12 ENCOUNTER — Ambulatory Visit (INDEPENDENT_AMBULATORY_CARE_PROVIDER_SITE_OTHER): Payer: Medicare Other

## 2020-09-12 ENCOUNTER — Other Ambulatory Visit: Payer: Self-pay

## 2020-09-12 DIAGNOSIS — I482 Chronic atrial fibrillation, unspecified: Secondary | ICD-10-CM

## 2020-09-12 DIAGNOSIS — Z5181 Encounter for therapeutic drug level monitoring: Secondary | ICD-10-CM

## 2020-09-12 DIAGNOSIS — E038 Other specified hypothyroidism: Secondary | ICD-10-CM | POA: Diagnosis not present

## 2020-09-12 DIAGNOSIS — Z7901 Long term (current) use of anticoagulants: Secondary | ICD-10-CM | POA: Diagnosis not present

## 2020-09-12 DIAGNOSIS — I4821 Permanent atrial fibrillation: Secondary | ICD-10-CM

## 2020-09-12 LAB — POCT INR: INR: 2.2 (ref 2.0–3.0)

## 2020-09-12 NOTE — Patient Instructions (Signed)
Continue taking 1.25 mg daily except 2.5 mg each Thursday and Saturday.  Repeat INR in 6 weeks.

## 2020-09-13 LAB — TSH: TSH: 0.267 u[IU]/mL — ABNORMAL LOW (ref 0.450–4.500)

## 2020-09-16 ENCOUNTER — Other Ambulatory Visit: Payer: Self-pay | Admitting: Family Medicine

## 2020-09-16 DIAGNOSIS — E039 Hypothyroidism, unspecified: Secondary | ICD-10-CM

## 2020-09-16 NOTE — Telephone Encounter (Signed)
Please advise 

## 2020-09-19 ENCOUNTER — Telehealth: Payer: Self-pay | Admitting: Cardiology

## 2020-09-19 ENCOUNTER — Telehealth: Payer: Self-pay

## 2020-09-19 NOTE — Telephone Encounter (Signed)
Spoke to patient, see duplicate phone note.

## 2020-09-19 NOTE — Telephone Encounter (Addendum)
Called and left a voice message for the patient to call back for results.   ----- Message from Marykay Lex, MD sent at 09/19/2020  1:53 AM EDT ----- TSH level was a little low.  I recommend a recheck free T4 and T3 levels.  This can be done when she comes in for her next treatment check.  Bryan Lemma, MD

## 2020-09-19 NOTE — Telephone Encounter (Signed)
Erika Lex, MD  09/19/2020 1:53 AM EDT      TSH level was a little low. I recommend a recheck free T4 and T3 levels.  This can be done when she comes in for her next treatment check.  Bryan Lemma, MD    Patient verbalized understanding.

## 2020-09-19 NOTE — Telephone Encounter (Signed)
Pt is returning a call to [employer] regarding results. Please advise

## 2020-09-27 ENCOUNTER — Other Ambulatory Visit: Payer: Self-pay | Admitting: Registered Nurse

## 2020-09-27 DIAGNOSIS — E039 Hypothyroidism, unspecified: Secondary | ICD-10-CM

## 2020-10-09 ENCOUNTER — Other Ambulatory Visit: Payer: Self-pay | Admitting: Cardiology

## 2020-10-20 ENCOUNTER — Ambulatory Visit (INDEPENDENT_AMBULATORY_CARE_PROVIDER_SITE_OTHER): Payer: Medicare Other | Admitting: Cardiology

## 2020-10-20 ENCOUNTER — Ambulatory Visit (INDEPENDENT_AMBULATORY_CARE_PROVIDER_SITE_OTHER): Payer: Medicare Other

## 2020-10-20 ENCOUNTER — Other Ambulatory Visit: Payer: Self-pay

## 2020-10-20 ENCOUNTER — Encounter: Payer: Self-pay | Admitting: Cardiology

## 2020-10-20 VITALS — BP 98/62 | HR 67 | Ht 63.0 in | Wt 170.0 lb

## 2020-10-20 DIAGNOSIS — I482 Chronic atrial fibrillation, unspecified: Secondary | ICD-10-CM

## 2020-10-20 DIAGNOSIS — I10 Essential (primary) hypertension: Secondary | ICD-10-CM | POA: Diagnosis not present

## 2020-10-20 DIAGNOSIS — E038 Other specified hypothyroidism: Secondary | ICD-10-CM | POA: Diagnosis not present

## 2020-10-20 DIAGNOSIS — Z5181 Encounter for therapeutic drug level monitoring: Secondary | ICD-10-CM

## 2020-10-20 DIAGNOSIS — E669 Obesity, unspecified: Secondary | ICD-10-CM

## 2020-10-20 DIAGNOSIS — I4821 Permanent atrial fibrillation: Secondary | ICD-10-CM | POA: Diagnosis not present

## 2020-10-20 DIAGNOSIS — R600 Localized edema: Secondary | ICD-10-CM

## 2020-10-20 DIAGNOSIS — Z7901 Long term (current) use of anticoagulants: Secondary | ICD-10-CM | POA: Diagnosis not present

## 2020-10-20 LAB — POCT INR: INR: 2 (ref 2.0–3.0)

## 2020-10-20 MED ORDER — DILTIAZEM HCL ER COATED BEADS 180 MG PO CP24: 180.0000 mg | ORAL_CAPSULE | Freq: Every day | ORAL | 3 refills | Status: DC

## 2020-10-20 NOTE — Patient Instructions (Addendum)
Medication Instructions:  Stop taking Diltiazem ( Cartia)  XT 240 mg   Start taking Diltiazem 180 mg one capsule daily   *If you need a refill on your cardiac medications before your next appointment, please call your pharmacy*   Lab Work: freeT4, free  t3 If you have labs (blood work) drawn today and your tests are completely normal, you will receive your results only by: Marland Kitchen MyChart Message (if you have MyChart) OR . A paper copy in the mail If you have any lab test that is abnormal or we need to change your treatment, we will call you to review the results.   Testing/Procedures: not needed   Follow-Up: At [facility], you and your health needs are our priority.  As part of our continuing mission to provide you with exceptional heart care, we have created designated Provider Care Teams.  These Care Teams include your primary Cardiologist (physician) and Advanced Practice Providers (APPs -  Physician Assistants and Nurse Practitioners) who all work together to provide you with the care you need, when you need it.  We recommend signing up for the patient portal called "MyChart".  Sign up information is provided on this After Visit Summary.  MyChart is used to connect with patients for Virtual Visits (Telemedicine).  Patients are able to view lab/test results, encounter notes, upcoming appointments, etc.  Non-urgent messages can be sent to your provider as well.   To learn more about what you can do with MyChart, go to [email or web address].    Your next appointment:   6 month(s)  The format for your next appointment:   In Person  Provider:   Bryan Lemma, MD

## 2020-10-20 NOTE — Patient Instructions (Signed)
Continue taking 1.25 mg daily except 2.5 mg each Thursday and Saturday.  Repeat INR in 6 weeks.

## 2020-10-20 NOTE — Progress Notes (Signed)
Primary Care Provider: [name], MD Cardiologist: Bryan Lemma, MD Electrophysiologist: None  Clinic Note: Chief Complaint  Patient presents with  . Follow-up    Still losing weight.  Noting dizziness.  . Atrial Fibrillation    No symptoms    ===================================  ASSESSMENT/PLAN   Problem List Items Addressed This Visit    Obesity (BMI 35.0-39.9 without comorbidity) -  (Chronic)   Permanent atrial fibrillation (HCC): CHA2DS2Vasc = 4, on Warfarin; CCB for Rate control - Primary (Chronic)    Remains rate controlled on diltiazem.  She is hypotensive today.  Plan: Reduce diltiazem 180 mg daily.  With fatigue, checking thyroid panel.      Relevant Medications   diltiazem (CARDIZEM CD) 180 MG 24 hr capsule   Other Relevant Orders   T4, free (Completed)   T3, free (Completed)   Bilateral lower extremity edema (Chronic)    On standing dose of furosemide.  Has not required any additional dosing.  No PND orthopnea.  Swelling simply seems to come and goes.  Depends on what she eats.  Has not wanted to think about compression stockings.  She does elevate her feet.      Hypothyroidism (Chronic)    Checking thyroid levels.      Essential hypertension (Chronic)    Now hypotensive.  Reducing dose of diltiazem to 180 mg daily.  She still takes Lasix for edema and remains euvolemic.      Relevant Medications   diltiazem (CARDIZEM CD) 180 MG 24 hr capsule   Long term current use of anticoagulant therapy (Chronic)    Continues on warfarin.  Stable no bleeding issues.      Relevant Orders   T4, free (Completed)   T3, free (Completed)      ===================================  HPI:    [name] is a 80 y.o. female with a PMH Notable for Permanent A. fib who presents today for 61-month follow-up  [name] was last seen on May 07, 2020.  She was noting lots of issues with her psoriatic arthritis.  Also having upper respiratory tract  symptoms that lasted a few days after COVID-19 vaccine booster.  Still noting fatigue, insomnia and lots of stress about living situation.  Notable weight loss.  Not active, not going out.  Only goes out to the grocery store.  Major activities carrying groceries including dog and cat food into the house.  Limited by back pain, hip and knee pain.  No sensation of A. fib.  Recent Hospitalizations: None  Reviewed  CV studies:    The following studies were reviewed today: (if available, images/films reviewed: From Epic Chart or Care Everywhere) . None:  Interval History:   [name] returns today overall still feeling quite well.  She is starting noticed that she feels little more dizzy with standing up.  She is not as active because her back is really bothering her.  She really does not notice A. fib and has not really noted any PND orthopnea.  She has lost a lot of weight of late, and her pressures of notably reduced.  She is really limited by her arthritis pains not just her back but also her hips and knees.  Otherwise pretty stable cardiac standpoint.  Things seem to be a little bit better at home, but her roommate is now having declining health and even worsening dementia.  She is able to have some privacy time now that her roommate is less active and that  seems to be helping her stress level.  CV Review of Symptoms (Summary) Cardiovascular ROS: positive for - dyspnea on exertion, edema and Some lightheadedness and dizziness but no near syncope. negative for - chest pain, irregular heartbeat, loss of consciousness, orthopnea, palpitations, paroxysmal nocturnal dyspnea, rapid heart rate, shortness of breath or TIA/amaurosis fugax, claudication   REVIEWED OF SYSTEMS   Notable Positives (otherwise negative).   Fatigue/low energy.  Weight loss (28 pounds in a year)-not eating as as much, or as well.  Has exertional dyspnea mostly due to deconditioning.  Significant back and bilateral  hip/knee pain.  Less trouble now with seasonal allergies but was bad a month ago.  Has insomnia and anxiety/nervousness.  She probably does have multiple features of depression -> poor sleep, anhedonia, poor appetite.  I have reviewed and (if needed) personally updated the patient's problem list, medications, allergies, past medical and surgical history, social and family history.   PAST MEDICAL HISTORY   Past Medical History:  Diagnosis Date  . Atrial fibrillation, chronic (HCC): CHA2DS2Vasc = 4, on Warfarin; CCB for Rate control 05/01/2013   Previously on Flecainide - stopped due to recurrent Afib -- no longer effective. Did not tolerate Multaq.   This patients CHA2DS2-VASc Score and unadjusted Ischemic Stroke Rate (% per year) is equal to 4.8 % stroke rate/year from a score of 4 (HTN, Female, h/o TIA)    . Chronic fatigue   . Chronic pain   . COPD (chronic obstructive pulmonary disease) (HCC)   . Dyspnea on exertion    A.) Lexican Mycardial Perfusion with Wall Motion LVEF, Date:09/07/2011, TID ratio 1.03, lung to heart ratio 26%.  Marland Kitchen Hypothyroidism   . Obesity   . Seasonal allergies   . Stroke (HCC)   . Thyroid disease    HYPOTHYROID  . TIA (transient ischemic attack) 2010   A.) Carotid Duplex study, 10/08/2008, minimal disease    PAST SURGICAL HISTORY   Past Surgical History:  Procedure Laterality Date  . CARDIOVERSION N/A 08/16/2012   Procedure: CARDIOVERSION;  Surgeon: Marykay Lex, MD;  Location: [facility] ENDOSCOPY;  Service: Endoscopy;  Laterality: N/A;  labs on arrival   . CARDIOVERSION N/A 09/14/2012   Procedure: CARDIOVERSION;  Surgeon: Chrystie Nose, MD;  Location[facility] ENDOSCOPY;  Service: Cardiovascular;  Laterality: N/A;  . 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 NM MYOVIEW LTD  April 2013   No evidence of ischemia or infarction. LOW RISK;  normal EF  . TONSILLECTOMY    . TOTAL HIP ARTHROPLASTY  [id number]  . TRANSTHORACIC ECHOCARDIOGRAM  09-2012   Moderate  concentric LVH. EF 60-65%.; Mild LA dilation.    Immunization History  Administered Date(s) Administered  . Influenza Whole 03/03/2011  . Influenza-Unspecified 03/07/2014  . Pneumococcal Conjugate-13 01/13/2015  . Pneumococcal Polysaccharide-23 04/12/2016  . Tdap 03/11/2011    MEDICATIONS/ALLERGIES   Current Meds  Medication Sig  . albuterol (VENTOLIN HFA) 108 (90 Base) MCG/ACT inhaler Inhale 2 puffs into the lungs every 4 (four) hours as needed for wheezing or shortness of breath (cough, shortness of breath or wheezing.).  Marland Kitchen diltiazem (CARDIZEM CD) 180 MG 24 hr capsule Take 1 capsule (180 mg total) by mouth daily.  . furosemide (LASIX) 40 MG tablet TAKE 1 TABLET BY MOUTH  DAILY  . latanoprost (XALATAN) 0.005 % ophthalmic solution SMARTSIG:In Eye(s)  . levothyroxine (SYNTHROID) 100 MCG tablet TAKE 1 TABLET BY MOUTH  DAILY  . pseudoephedrine (SUDAFED) 30 MG tablet Take 30 mg  by mouth every 4 (four) hours as needed for congestion.  . timolol (TIMOPTIC) 0.5 % ophthalmic solution SMARTSIG:In Eye(s)  . warfarin (COUMADIN) 2.5 MG tablet Take 1/2 to 1 tablet by mouth daily or as directed by coumadin clinic  . [DISCONTINUED] CARTIA XT 240 MG 24 hr capsule TAKE 1 CAPSULE BY MOUTH  DAILY  . [DISCONTINUED] ofloxacin (OCUFLOX) 0.3 % ophthalmic solution Place into the left eye.    Allergies  Allergen Reactions  . Amiodarone Diarrhea    SOCIAL HISTORY/FAMILY HISTORY   Reviewed in Epic:  Pertinent findings:  Social History   Tobacco Use  . Smoking status: Former Smoker    Packs/day: 2.00    Quit date: 06/07/2004    Years since quitting: 16.4  . Smokeless tobacco: Never Used  Substance Use Topics  . Alcohol use: No    Comment: occasionally  . Drug use: No   Social History   Social History Narrative   She is divorced,   She lives with a long-term friend --> They are both transplants from [place] (she is from [place]).   Unfortunately, her friend has now had progressively worsening  dementia with intermittent bouts of anger and is quite controlling.   Lauryl is not currently on the lease, the friend is.  As such, she is trapped because financially she cannot afford to move out.      She is a former smoker, who quit in 2006. She claims to never have "inhaled."   She does not get routine exercise and is now almost sedentary besides going shopping.      Her mother died in 06/24/21at just about 57 years old.   She has a son back in [place] who she keeps up with intermittently.    OBJCTIVE -PE, EKG, labs   Wt Readings from Last 3 Encounters:  10/20/20 170 lb (77.1 kg)  05/07/20 181 lb 12.8 oz (82.5 kg)  11/09/19 198 lb (89.8 kg)   Physical Exam: BP 98/62   Pulse 67   Ht  (1.6 m)   Wt 170 lb (77.1 kg)   SpO2 98%   BMI 30.11 kg/m  Physical Exam Vitals reviewed.  Constitutional:      General: She is not in acute distress.    Appearance: She is obese. She is ill-appearing (Partially because of weight loss, and depressed mood she appears mildly ill appearing.). She is not toxic-appearing.     Comments: Well-groomed.   Remains "obese "butNotable weight loss".   HENT:     Head: Normocephalic and atraumatic.  Neck:     Vascular: No carotid bruit or JVD.  Cardiovascular:     Rate and Rhythm: Normal rate. Rhythm irregularly irregular.  No extrasystoles are present.    Chest Wall: PMI is not displaced.     Pulses: Decreased pulses (Decreased but palpable pedal pulses.).     Heart sounds: S1 normal and S2 normal. Heart sounds are distant. Murmur heard.  High-pitched blowing holosystolic murmur is present with a grade of 1/6 at the apex. No friction rub. No gallop.   Pulmonary:     Effort: Pulmonary effort is normal. No respiratory distress.     Breath sounds: Normal breath sounds. No wheezing, rhonchi or rales.  Chest:     Chest wall: No tenderness.  Musculoskeletal:        General: No swelling. Normal range of motion.     Cervical back: Normal range of  motion and neck supple.  Skin:    General: Skin is warm and dry.  Neurological:     General: No focal deficit present.     Mental Status: She is alert and oriented to person, place, and time.  Psychiatric:        Behavior: Behavior normal.        Thought Content: Thought content normal.        Judgment: Judgment normal.     Comments: She does seem down.     Adult ECG Report n/a  Recent Labs:  No new labs -- she said that she had some done by PCP (not available).   Lab Results  Component Value Date   TSH 0.267 (L) 09/12/2020    ==================================================  COVID-19 Education: The signs and symptoms of COVID-19 were discussed with the patient and how to seek care for testing (follow up with PCP or arrange E-visit).    I spent a total of 26 minutes with the patient spent in direct patient consultation.  Additional time spent with chart review  / charting (studies, outside notes, etc): 10 min Total Time: 36 min  Current medicines are reviewed at length with the patient today.  (+/- concerns) n/a  This visit occurred during the SARS-CoV-2 public health emergency.  Safety protocols were in place, including screening questions prior to the visit, additional usage of staff PPE, and extensive cleaning of exam room while observing appropriate contact time as indicated for disinfecting solutions.  Notice: This dictation was prepared with Dragon dictation along with smaller phrase technology. Any transcriptional errors that result from this process are unintentional and may not be corrected upon review.  Patient Instructions / Medication Changes & Studies & Tests Ordered   Patient Instructions  Medication Instructions:  Stop taking Diltiazem ( Cartia)  XT 240 mg   Start taking Diltiazem 180 mg one capsule daily   *If you need a refill on your cardiac medications before your next appointment, please call your pharmacy*   Lab Work: freeT4, free  t3 If you  have labs (blood work) drawn today and your tests are completely normal, you will receive your results only by: Marland Kitchen MyChart Message (if you have MyChart) OR . A paper copy in the mail If you have any lab test that is abnormal or we need to change your treatment, we will call you to review the results.   Testing/Procedures: not needed   Follow-Up: At [facility], you and your health needs are our priority.  As part of our continuing mission to provide you with exceptional heart care, we have created designated Provider Care Teams.  These Care Teams include your primary Cardiologist (physician) and Advanced Practice Providers (APPs -  Physician Assistants and Nurse Practitioners) who all work together to provide you with the care you need, when you need it.  We recommend signing up for the patient portal called "MyChart".  Sign up information is provided on this After Visit Summary.  MyChart is used to connect with patients for Virtual Visits (Telemedicine).  Patients are able to view lab/test results, encounter notes, upcoming appointments, etc.  Non-urgent messages can be sent to your provider as well.   To learn more about what you can do with MyChart, go to [email or web address].    Your next appointment:   6 month(s)  The format for your next appointment:   In Person  Provider:   Bryan Lemma, MD     Studies Ordered:   Orders Placed This Encounter  Procedures  .  T4, free  . T3, free     Bryan Lemmaavid Martavia Tye, M.D., M.S. Interventional Cardiologist   Pager # [phone] Phone # [phone] [address] [place], [place] [address]   Thank you for choosing [facility] at [facility]!!

## 2020-10-21 DIAGNOSIS — H938X1 Other specified disorders of right ear: Secondary | ICD-10-CM | POA: Diagnosis not present

## 2020-10-21 DIAGNOSIS — H6122 Impacted cerumen, left ear: Secondary | ICD-10-CM | POA: Diagnosis not present

## 2020-10-21 LAB — T4, FREE: Free T4: 1.9 ng/dL — ABNORMAL HIGH (ref 0.82–1.77)

## 2020-10-21 LAB — T3, FREE: T3, Free: 2.1 pg/mL (ref 2.0–4.4)

## 2020-10-22 ENCOUNTER — Telehealth: Payer: Self-pay

## 2020-10-22 ENCOUNTER — Encounter: Payer: Self-pay | Admitting: *Deleted

## 2020-10-22 NOTE — Telephone Encounter (Signed)
Pt called to ask about thyroid will route to sh[facility] dr Erich Montane nurse

## 2020-10-24 NOTE — Telephone Encounter (Signed)
Left detail message on voicemail. As well as letter was mailed to the office. Any question weill call back.

## 2020-10-24 NOTE — Telephone Encounter (Signed)
Marykay Lex, MD  10/21/2020 6:30 PM EDT      Free T4 is a little on the high side and free T3 is a little on the low side.  Probably stable.

## 2020-11-12 ENCOUNTER — Encounter: Payer: Self-pay | Admitting: Cardiology

## 2020-11-12 ENCOUNTER — Telehealth: Payer: Self-pay | Admitting: Licensed Clinical Social Worker

## 2020-11-12 NOTE — Assessment & Plan Note (Signed)
Checking thyroid levels.

## 2020-11-12 NOTE — Assessment & Plan Note (Signed)
Now hypotensive.  Reducing dose of diltiazem to 180 mg daily.  She still takes Lasix for edema and remains euvolemic.

## 2020-11-12 NOTE — Telephone Encounter (Signed)
[name] called back, confirmed she had gotten her ride sorted and is aware I had sent additional resources to her for alternate rides. [name] shares she is still concerned about being able to take care of her friend, [name], as she continues to decline. 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 [name] also continues to feed cats both indoor and outdoor which she worries will be an issue if someone comes to the house.   LCSW suggestions were as follows: [name] needs to reach out to her family (she does have extended family here in [place] and [place] and her son is in [place]) and let them know that she needs assistance in any way they can provide (whether that's helping with calling/research etc). I also shared that I can provide her with information regarding in home care agencies and recommended that she have in mind when she would need them (mornings, evenings, all day?) and if there is anything specific she would like them to assist with (bathing, dressing, housework etc?). I finally recommended that she stop feeding the feral cats around the home as per her report this is both costly and takes her considerable time daily. I feel it would both financially best to assist with [name]/[name] friend's care and would allow [name] to focus on her friend/her care.   I have mailed a list of personal care services to [name] and will f/u with her to see if she has looked into anything for her friend at in 1-2 weeks.   Octavio Graves, MSW, LCSW [facility] Health Heart/Vascular Care Navigation  [phone]

## 2020-11-12 NOTE — Telephone Encounter (Signed)
LCSW returned pt call, no answer but message left on cell voicemail, regarding ride to upcoming hearing aid test. LCSW able to assist w/ [facility] Transportation, I also sent an additional list for transportation options to pt in case a future ride cannot be arranged through [facility]. Request sent through to [facility].   Octavio Graves, MSW, LCSW [facility] Health Heart/Vascular Care Navigation  [phone]

## 2020-11-12 NOTE — Assessment & Plan Note (Signed)
On standing dose of furosemide.  Has not required any additional dosing.  No PND orthopnea.  Swelling simply seems to come and goes.  Depends on what she eats.  Has not wanted to think about compression stockings.  She does elevate her feet.

## 2020-11-12 NOTE — Assessment & Plan Note (Signed)
Continues on warfarin.  Stable no bleeding issues.

## 2020-11-12 NOTE — Assessment & Plan Note (Signed)
Remains rate controlled on diltiazem.  She is hypotensive today.  Plan: Reduce diltiazem 180 mg daily.  With fatigue, checking thyroid panel.

## 2020-11-24 ENCOUNTER — Telehealth: Payer: Self-pay | Admitting: Licensed Clinical Social Worker

## 2020-11-24 NOTE — Telephone Encounter (Signed)
Pt called this Clinical research associate regarding upcoming appts. She is all set up for her appt at our office on 6/27.  She has a final appt with audiology on July 5th at 2:15pm- I shared I would make a note to f/u with Transportation Services to get that scheduled next week closer to that time. LCSW also reminded pt that there are additional transportation resources that I have sent her and encouraged her to call [facility] and let them know what's going on, to see if she can get on the list for their services. Pt shares she is in need of making a PCP and eye dr appt. She has both Barrister's clerk.  She has my number and will call if any additional needs.  Octavio Graves, MSW, LCSW [facility] Health Heart/Vascular Care Navigation  [phone]

## 2020-11-27 ENCOUNTER — Other Ambulatory Visit: Payer: Self-pay

## 2020-11-27 DIAGNOSIS — E039 Hypothyroidism, unspecified: Secondary | ICD-10-CM

## 2020-11-27 MED ORDER — DILTIAZEM HCL ER COATED BEADS 180 MG PO CP24: 180.0000 mg | ORAL_CAPSULE | Freq: Every day | ORAL | 3 refills | Status: DC

## 2020-11-27 MED ORDER — LEVOTHYROXINE SODIUM 100 MCG PO TABS: 100.0000 ug | ORAL_TABLET | Freq: Every day | ORAL | 3 refills | Status: DC

## 2020-11-27 MED ORDER — FUROSEMIDE 40 MG PO TABS: 40.0000 mg | ORAL_TABLET | Freq: Every day | ORAL | 3 refills | Status: DC

## 2020-12-01 ENCOUNTER — Other Ambulatory Visit: Payer: Self-pay

## 2020-12-01 ENCOUNTER — Ambulatory Visit (INDEPENDENT_AMBULATORY_CARE_PROVIDER_SITE_OTHER): Payer: Medicare Other | Admitting: Pharmacist Clinician (PhC)/ Clinical Pharmacy Specialist

## 2020-12-01 DIAGNOSIS — Z7901 Long term (current) use of anticoagulants: Secondary | ICD-10-CM

## 2020-12-01 DIAGNOSIS — I482 Chronic atrial fibrillation, unspecified: Secondary | ICD-10-CM | POA: Diagnosis not present

## 2020-12-01 DIAGNOSIS — I4821 Permanent atrial fibrillation: Secondary | ICD-10-CM | POA: Diagnosis not present

## 2020-12-01 LAB — POCT INR: INR: 2 (ref 2.0–3.0)

## 2020-12-03 ENCOUNTER — Telehealth: Payer: Self-pay | Admitting: Licensed Clinical Social Worker

## 2020-12-03 NOTE — Telephone Encounter (Signed)
LCSW attempted to reach pt via telephone, no answer at home or cell. HIPAA compliant message left for pt to arrange a ride for her on July 5th. Would like to clarify the location and time. Left my number along with a request for her to f/u with me. If no response I will f/u with pt again/provide Transportation Services with request.   Octavio Graves, MSW, LCSW [facility] Health Heart/Vascular Care Navigation  [phone]

## 2020-12-10 ENCOUNTER — Other Ambulatory Visit: Payer: Self-pay | Admitting: Cardiology

## 2020-12-15 ENCOUNTER — Telehealth: Payer: Self-pay | Admitting: Licensed Clinical Social Worker

## 2020-12-15 NOTE — Telephone Encounter (Signed)
Received a call from pt, she is frustrated that she has been on hold for [employer] assistance multiple times and not reached anyone to get a ride to get her hearing aids. LCSW provided alternate number for [employer] and she will call them to hopefully get through. Encouraged her to reach back out to me if she does not hear anything from them. Pt also shares she has a scheduled appt to establish with Dr. Tracie Harrier with [facility] on [facility] (PCP) for later this year.   Octavio Graves, MSW, LCSW [facility] Health Heart/Vascular Care Navigation  [phone]

## 2020-12-16 ENCOUNTER — Telehealth: Payer: Self-pay | Admitting: Licensed Clinical Social Worker

## 2020-12-16 NOTE — Telephone Encounter (Signed)
LCSW received a call from pt this afternoon. She shares that she was able to reach someone at [employer] to leave a message and is waiting on a call back. LCSW share that I generally give organizations at least 48 hours to return my call. Pt shares that her friend Corrie Dandy is declining, social services has come by twice. Her friends brother has stated he will pay for in home care and a gardener. She shares she is not looking forward to anyone coming into the home. I encouraged pt that at this point that is the best thing for [facility] and for her. She mentions that she is still feeding the cats in and around their home and I again encouraged her to stop feeding the outdoor animals and to request assistance with adopting out/surrendering the indoor cats.   Her niece has put her on the waitlist for senior housing in [place] closer to family. She is ambivalent about this, "I chickened out last time." Encouraged her to utilize these gestures from family as positives; pt seems to know what she needs to work on/let go of but does not do so. Pt did mention feeling that sometimes she wants to "off myself." LCSW spoke with pt further about this statement and when I mention that when she makes statements like that I have concern about her safety she states that she "would never actually do that because I am Catholic and that means I wouldn't get to see my Grandmother who I loved more than anything in the world." Pt denies any plans to harm herself or others currently and is agreeable to checking in with this writer if she does feel she needs formal mental health supports or is feeling overwhelmed.   Plan to speak again on Thursday if she hasnt heard back from [employer], remain available prior to that time as needed.   Octavio Graves, MSW, LCSW [facility] Health Heart/Vascular Care Navigation  [phone]

## 2020-12-19 ENCOUNTER — Telehealth: Payer: Self-pay | Admitting: Licensed Clinical Social Worker

## 2020-12-19 NOTE — Telephone Encounter (Signed)
LCSW spoke with pt this morning, she shares she still hasn't heard back from [employer] program.   LCSW was able to call and reach representative Romeo Apple this morning who took pt information down (name, number, and need to get to hearing aid office) and shared he would have someone call her. This information was relayed to pt after call. Encouraged her to reach back out to me if any additional questions.   Octavio Graves, MSW, LCSW [facility] Health Heart/Vascular Care Navigation  [phone]

## 2020-12-29 ENCOUNTER — Telehealth: Payer: Self-pay

## 2020-12-29 ENCOUNTER — Telehealth: Payer: Self-pay | Admitting: Licensed Clinical Social Worker

## 2020-12-29 NOTE — Telephone Encounter (Signed)
Calling in to say that she is running out of diltiazem only has a 2week supply and the pharmacy has issues getting it. I will route to kristin alvstad as she requested her personally

## 2020-12-29 NOTE — Telephone Encounter (Signed)
LCSW received a call from pt this morning, she shares that she has an updated phone number [phone]). She had a visit from her niece and she assisted with a new phone. She is still on the waitlist for the low income housing in [place] near her nieces. In the meantime they had offered for pt to come live with them in rotation- she is not as keen on this idea. Pt shares she has been very frustrated by her interactions w/ DSS APS workers, they had her friend Corrie Dandy go to the hospital this weekend and she was discharged home in less than 24 hrs. There has been legal interactions which she thinks is to determine Mary's competency. She is starting to really warm up to the idea of being close to family and living in a new location. She shares she will call me if any additional questions/concerns arise.  Octavio Graves, MSW, LCSW [facility] Health Heart/Vascular Care Navigation  [phone]

## 2020-12-30 NOTE — Telephone Encounter (Signed)
Patient was told by [facility] that they were out of diltiazem 180 mg.  She still has 2 week supply, will let us know if she gets down to 2-3 days and we can fill at local pharmacy for month.

## 2021-01-06 ENCOUNTER — Telehealth: Payer: Self-pay

## 2021-01-06 MED ORDER — DILTIAZEM HCL ER 180 MG PO CP24: 180.0000 mg | ORAL_CAPSULE | Freq: Every day | ORAL | 3 refills | Status: DC

## 2021-01-06 NOTE — Telephone Encounter (Signed)
Pt calling to speak w/kristin alvstad pharmd regarding medications will route to her for follow up. Pt didn't specify which meds

## 2021-01-06 NOTE — Telephone Encounter (Signed)
Pharmacy cannot supply diltiazem CD 180 mg.  No release date.  Spoke with Pharmacy staff at [facility].  They do have dilacorXR 180 mg.  Will switch patient to that formulation.  Patient notified.

## 2021-01-09 ENCOUNTER — Telehealth: Payer: Self-pay | Admitting: Licensed Clinical Social Worker

## 2021-01-09 NOTE — Telephone Encounter (Signed)
LCSW called Transportation Services to schedule pt ride for 8/8.  Pt has utilized Transportation before and is aware of how to change appts as needed.   Octavio Graves, MSW, LCSW [facility] Health Heart/Vascular Care Navigation  [phone]

## 2021-01-12 ENCOUNTER — Other Ambulatory Visit: Payer: Self-pay

## 2021-01-12 ENCOUNTER — Ambulatory Visit (INDEPENDENT_AMBULATORY_CARE_PROVIDER_SITE_OTHER): Payer: Medicare Other

## 2021-01-12 DIAGNOSIS — Z7901 Long term (current) use of anticoagulants: Secondary | ICD-10-CM

## 2021-01-12 DIAGNOSIS — I4821 Permanent atrial fibrillation: Secondary | ICD-10-CM

## 2021-01-12 DIAGNOSIS — Z5181 Encounter for therapeutic drug level monitoring: Secondary | ICD-10-CM | POA: Diagnosis not present

## 2021-01-12 DIAGNOSIS — I482 Chronic atrial fibrillation, unspecified: Secondary | ICD-10-CM

## 2021-01-12 LAB — POCT INR: INR: 2.7 (ref 2.0–3.0)

## 2021-01-12 NOTE — Patient Instructions (Signed)
Continue taking 1.25 mg daily except 2.5 mg each Thursday and Saturday.  Repeat INR in 6 weeks.   

## 2021-01-13 ENCOUNTER — Encounter: Payer: Self-pay | Admitting: Pharmacist

## 2021-01-13 NOTE — Telephone Encounter (Signed)
error    This encounter was created in error - please disregard.

## 2021-01-20 ENCOUNTER — Telehealth: Payer: Self-pay | Admitting: Licensed Clinical Social Worker

## 2021-01-20 NOTE — Telephone Encounter (Signed)
LCSW received a call from pt who shares that placement for her friend and housemate Corrie Dandy is going forward and so she has started the process of moving out of the house. Her apartment in [place] is ready and although a bit apprehensive she is ready for the next journey. She shares that she has lots of clothing and housewares that she would like to donate, she has called a few organizations but having issues with getting them picked up. LCSW shared that I would reach out to [employer] and let her know if they are able to assist with a home pick up. Unfortunately, there is not a way to schedule this online, I called as advised [phone], no answer. Will reattempt before end of business today and let pt know updates.    Octavio Graves, MSW, LCSW [facility]  [phone]

## 2021-01-21 ENCOUNTER — Telehealth: Payer: Self-pay | Admitting: Licensed Clinical Social Worker

## 2021-01-21 ENCOUNTER — Other Ambulatory Visit: Payer: Self-pay

## 2021-01-21 DIAGNOSIS — E039 Hypothyroidism, unspecified: Secondary | ICD-10-CM

## 2021-01-21 MED ORDER — FUROSEMIDE 40 MG PO TABS: 40.0000 mg | ORAL_TABLET | Freq: Every day | ORAL | 1 refills | Status: DC

## 2021-01-21 MED ORDER — DILTIAZEM HCL ER 180 MG PO CP24: 180.0000 mg | ORAL_CAPSULE | Freq: Every day | ORAL | 1 refills | Status: AC

## 2021-01-21 MED ORDER — WARFARIN SODIUM 2.5 MG PO TABS: ORAL_TABLET | ORAL | 0 refills | Status: DC

## 2021-01-21 MED ORDER — LEVOTHYROXINE SODIUM 100 MCG PO TABS: 100.0000 ug | ORAL_TABLET | Freq: Every day | ORAL | 1 refills | Status: AC

## 2021-01-21 NOTE — Telephone Encounter (Signed)
LCSW called and spoke with pt about scheduling donation for [employer]. Pt will need to call [phone] and leave information on administration voicemail anytime Monday- Friday, 9-5pm.   Unfortunately, pt expressed increased stress this afternoon as the moving company she had spoken with had spoken with had declined to take her items to [place] due to the pt's cats and issues with urine on items. Pt will call other companies to check on their ability to take things. I cautioned pt as well that [employer] may also not be able to accept items due to the home but to be transparent and see what they said. I shared some encouragement for all she has been doing and her ongoing persistence to get moved and settled.   Octavio Graves, MSW, LCSW [facility]  [phone]

## 2021-01-21 NOTE — Telephone Encounter (Signed)
Pt called in and stated that they are moving to [place] and that they need 90 day refills sent asap to [facility] so that they can get established with another provider. Will route to [facility] refill pool as I have already refilled warfarin. My supervisor Laural Golden stated that we grant them a 60 day grace period to find another doctor

## 2021-01-21 NOTE — Telephone Encounter (Signed)
Rx(s) sent to pharmacy electronically.  

## 2021-01-30 ENCOUNTER — Telehealth: Payer: Self-pay | Admitting: Licensed Clinical Social Worker

## 2021-01-30 NOTE — Telephone Encounter (Signed)
Received a call from pt this morning, she shares that her roommate Corrie Dandy has been moved to [facility]. She has had her neighbor helping her with the packing and sorting left around the home. She has had the rest of the feral cats on the property being handled by the [employer]. She has family coming to complete the move with her at the beginning of next week and then will take a flight to [place] to move into her new place. Pt is looking forward to this next step in her life and is happy Corrie Dandy is in a place that meets her needs. She plans on utilizing her niece's PCP until she is able to establish with everyone she needs. She has left a message with Raelene Bott pharmacist as well- she unfortunately is currently out of the office. I encouraged pt to call us if we can be of assistance moving forward. I congratulated her on the move and the courage that it takes to take this new step.   I remain available as needed.   Octavio Graves, MSW, LCSW [facility]  [phone]

## 2021-02-10 ENCOUNTER — Other Ambulatory Visit: Payer: Self-pay | Admitting: Cardiology

## 2021-04-20 ENCOUNTER — Ambulatory Visit: Payer: Medicare Other | Admitting: Cardiology

## 2021-11-16 ENCOUNTER — Other Ambulatory Visit: Payer: Self-pay | Admitting: Cardiology

## 2021-11-19 ENCOUNTER — Other Ambulatory Visit: Payer: Self-pay | Admitting: Cardiology

## 2022-01-05 ENCOUNTER — Other Ambulatory Visit: Payer: Self-pay | Admitting: Cardiology

## 2022-03-10 ENCOUNTER — Other Ambulatory Visit: Payer: Self-pay | Admitting: Cardiology
